# Patient Record
Sex: Female | Born: 1945
Health system: Southern US, Community
[De-identification: ages and names within clinical notes are randomized; demographics above are authoritative.]

## PROBLEM LIST (undated history)

## (undated) DIAGNOSIS — I639 Cerebral infarction, unspecified: Secondary | ICD-10-CM

## (undated) DIAGNOSIS — E119 Type 2 diabetes mellitus without complications: Secondary | ICD-10-CM

## (undated) DIAGNOSIS — R112 Nausea with vomiting, unspecified: Secondary | ICD-10-CM

## (undated) DIAGNOSIS — I10 Essential (primary) hypertension: Secondary | ICD-10-CM

## (undated) DIAGNOSIS — E669 Obesity, unspecified: Secondary | ICD-10-CM

## (undated) DIAGNOSIS — I69398 Other sequelae of cerebral infarction: Secondary | ICD-10-CM

## (undated) DIAGNOSIS — I480 Paroxysmal atrial fibrillation: Secondary | ICD-10-CM

## (undated) DIAGNOSIS — E785 Hyperlipidemia, unspecified: Secondary | ICD-10-CM

## (undated) DIAGNOSIS — I447 Left bundle-branch block, unspecified: Secondary | ICD-10-CM

## (undated) DIAGNOSIS — IMO0002 Reserved for concepts with insufficient information to code with codable children: Secondary | ICD-10-CM

## (undated) DIAGNOSIS — I440 Atrioventricular block, first degree: Secondary | ICD-10-CM

## (undated) DIAGNOSIS — Z9889 Other specified postprocedural states: Secondary | ICD-10-CM

## (undated) HISTORY — PX: KNEE ARTHROSCOPY: SUR90

## (undated) HISTORY — DX: Essential (primary) hypertension: I10

## (undated) HISTORY — DX: Obesity, unspecified: E66.9

## (undated) HISTORY — DX: Hyperlipidemia, unspecified: E78.5

## (undated) HISTORY — PX: CHOLECYSTECTOMY: SHX55

## (undated) HISTORY — PX: TOTAL ABDOMINAL HYSTERECTOMY: SHX209

## (undated) HISTORY — DX: Atrioventricular block, first degree: I44.0

## (undated) HISTORY — DX: Left bundle-branch block, unspecified: I44.7

## (undated) HISTORY — DX: Reserved for concepts with insufficient information to code with codable children: IMO0002

## (undated) HISTORY — DX: Paroxysmal atrial fibrillation: I48.0

---
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
## 2003-02-23 ENCOUNTER — Ambulatory Visit (HOSPITAL_COMMUNITY): Admission: RE | Admit: 2003-02-23 | Discharge: 2003-02-23 | Payer: Self-pay | Admitting: Orthopaedic Surgery

## 2006-06-17 ENCOUNTER — Encounter (INDEPENDENT_AMBULATORY_CARE_PROVIDER_SITE_OTHER): Payer: Self-pay | Admitting: Specialist

## 2006-06-18 ENCOUNTER — Ambulatory Visit (HOSPITAL_COMMUNITY): Admission: RE | Admit: 2006-06-18 | Discharge: 2006-06-18 | Payer: Self-pay | Admitting: Orthopaedic Surgery

## 2007-08-25 ENCOUNTER — Ambulatory Visit (HOSPITAL_COMMUNITY): Admission: RE | Admit: 2007-08-25 | Discharge: 2007-08-25 | Payer: Self-pay | Admitting: Family Medicine

## 2007-08-28 ENCOUNTER — Emergency Department (HOSPITAL_COMMUNITY): Admission: EM | Admit: 2007-08-28 | Discharge: 2007-08-28 | Payer: Self-pay | Admitting: Emergency Medicine

## 2007-09-10 ENCOUNTER — Ambulatory Visit: Payer: Self-pay | Admitting: Orthopedic Surgery

## 2007-09-10 ENCOUNTER — Telehealth: Payer: Self-pay | Admitting: Orthopedic Surgery

## 2009-06-24 HISTORY — PX: BACK SURGERY: SHX140

## 2010-01-23 ENCOUNTER — Ambulatory Visit: Payer: Self-pay

## 2010-02-14 ENCOUNTER — Inpatient Hospital Stay (HOSPITAL_COMMUNITY): Admission: EM | Admit: 2010-02-14 | Discharge: 2010-02-16 | Payer: Self-pay | Source: Home / Self Care

## 2010-02-15 ENCOUNTER — Encounter (INDEPENDENT_AMBULATORY_CARE_PROVIDER_SITE_OTHER): Payer: Self-pay | Admitting: Internal Medicine

## 2010-02-16 ENCOUNTER — Encounter (INDEPENDENT_AMBULATORY_CARE_PROVIDER_SITE_OTHER): Payer: Self-pay | Admitting: *Deleted

## 2010-02-16 LAB — CONVERTED CEMR LAB
ALT: 26 units/L
Albumin: 3.3 g/dL
Calcium: 8.8 mg/dL
Glomerular Filtration Rate, Af Am: >60 mL/min/{1.73_m2}
HDL: 28 mg/dL
LDL (calc): 178 mg/dL
MCHC: 34.4 g/dL
MCV: 90.6 fL
Platelets: 276 10*3/uL
Potassium: 3.9 meq/L
RBC: 4.27 M/uL
RDW: 12.9 %
Sodium: 138 meq/L
TSH: 0.96 microintl units/mL

## 2010-02-27 ENCOUNTER — Encounter (INDEPENDENT_AMBULATORY_CARE_PROVIDER_SITE_OTHER): Payer: Self-pay | Admitting: *Deleted

## 2010-02-28 ENCOUNTER — Encounter (INDEPENDENT_AMBULATORY_CARE_PROVIDER_SITE_OTHER): Payer: Self-pay | Admitting: *Deleted

## 2010-02-28 ENCOUNTER — Ambulatory Visit
Admission: RE | Admit: 2010-02-28 | Discharge: 2010-02-28 | Payer: Self-pay | Source: Home / Self Care | Attending: Cardiology | Admitting: Cardiology

## 2010-02-28 ENCOUNTER — Encounter
Admission: RE | Admit: 2010-02-28 | Discharge: 2010-02-28 | Payer: Self-pay | Source: Home / Self Care | Attending: Neurosurgery | Admitting: Neurosurgery

## 2010-02-28 DIAGNOSIS — I447 Left bundle-branch block, unspecified: Secondary | ICD-10-CM | POA: Insufficient documentation

## 2010-02-28 DIAGNOSIS — E785 Hyperlipidemia, unspecified: Secondary | ICD-10-CM | POA: Insufficient documentation

## 2010-02-28 DIAGNOSIS — E669 Obesity, unspecified: Secondary | ICD-10-CM | POA: Insufficient documentation

## 2010-02-28 DIAGNOSIS — M545 Low back pain: Secondary | ICD-10-CM | POA: Insufficient documentation

## 2010-02-28 DIAGNOSIS — I440 Atrioventricular block, first degree: Secondary | ICD-10-CM | POA: Insufficient documentation

## 2010-03-05 ENCOUNTER — Encounter: Payer: Self-pay | Admitting: Cardiology

## 2010-03-17 ENCOUNTER — Encounter: Payer: Self-pay | Admitting: Family Medicine

## 2010-03-20 ENCOUNTER — Encounter
Admission: RE | Admit: 2010-03-20 | Discharge: 2010-03-20 | Payer: Self-pay | Source: Home / Self Care | Attending: Neurosurgery | Admitting: Neurosurgery

## 2010-03-27 ENCOUNTER — Encounter: Payer: Self-pay | Admitting: Neurosurgery

## 2010-03-28 ENCOUNTER — Encounter: Payer: Self-pay | Admitting: Cardiology

## 2010-03-28 NOTE — Miscellaneous (Signed)
Summary: labs 02/16/2010  Clinical Lists Changes  Observations: Added new observation of CALCIUM: 8.5 mg/dL (16/02/930 35:57) Added new observation of GFR AA: >60 mL/min/1.69m2 (02/16/2010 15:33) Added new observation of GFR: >60 mL/min (02/16/2010 15:33) Added new observation of CREATININE: 0.60 mg/dL (32/20/2542 70:62) Added new observation of BUN: 15 mg/dL (37/62/8315 17:61) Added new observation of BG RANDOM: 123 mg/dL (60/73/7106 26:94) Added new observation of CO2 PLSM/SER: 23 meq/L (02/16/2010 15:33) Added new observation of CL SERUM: 108 meq/L (02/16/2010 15:33) Added new observation of K SERUM: 3.9 meq/L (02/16/2010 15:33) Added new observation of NA: 138 meq/L (02/16/2010 15:33) Added new observation of PLATELETK/UL: 276 K/uL (02/16/2010 15:33) Added new observation of RDW: 12.9 % (02/16/2010 15:33) Added new observation of MCHC RBC: 34.4 g/dL (85/46/2703 50:09) Added new observation of MCV: 90.6 fL (02/16/2010 15:33) Added new observation of HCT: 38.7 % (02/16/2010 15:33) Added new observation of HGB: 13.3 g/dL (38/18/2993 71:69) Added new observation of RBC M/UL: 4.27 M/uL (02/16/2010 15:33) Added new observation of WBC COUNT: 8.6 10*3/microliter (02/16/2010 15:33)

## 2010-03-28 NOTE — Letter (Signed)
Summary: [facility] Work [facility], agricultural at [employer]  [address] [place] [address]   Phone: [phone]  Fax: [phone]     February 28, 2010 MRN: [id number]   Sabrina Taylor [address] [place], [place]  [address]      YOUR LAB WORK IS DUE   April 01, 2010  Please go to [facility], located across the street from [facility] on the second floor.  Hours are Monday - Friday 7am until 7:30pm         Saturday 8am until 12noon    _X_  DO NOT EAT OR DRINK AFTER MIDNIGHT EVENING PRIOR TO LABWORK

## 2010-03-28 NOTE — Assessment & Plan Note (Signed)
Summary: post ED visit per pt phone call per Dr.Wall/tg   Visit Type:  Follow-up Primary Provider:  Dr. Karleen Hampshire   History of Present Illness: Ms. Sabrina Taylor returns to the office following a recent hospital admission for atrial fibrillation.  She presented with back pain and constipation and was noted incidentally to have atrial fibrillation with a somewhat rapid ventricular response.  She had no symptoms definitely attributable to her atrial arrhythmia and converted spontaneously to sinus rhythm after less than a day and a half.  Chest x-ray, echocardiogram and TSH were unremarkable.  She had no previous significant cardiac history and was not previously known to have arrhythmias.  EKG  Procedure date:  2010/03/24  Findings:      Normal sinus rhythm at a rate of 80 First degree AV block with a PR interval of 230 ms Borderline left atrial abnormality Left axis deviation Left bundle branch block Comparison with previous tracing of 02/14/10: Atrial flutter with 2-1 AV block was previously present; LBBB morphology is unchanged; axis has shifted leftward.  -  Date:  02/16/2010    SGOT (AST): 20    SGPT (ALT): 26    T. Bilirubin: 0.7    Alk Phos: 58    Total Protein: 6.8    Albumin: 3.3    Cholesterol: 227    LDL-calculated: 178    HDL: 28    Triglycerides: 045    TSH: 0.96   Current Medications (verified): 1)  Lipitor 20 Mg Tabs (Atorvastatin Calcium) .... Take 1 Tab Daily 2)  Toprol Xl 25 Mg Xr24h-Tab (Metoprolol Succinate) .... Take 1 Tab Daily 3)  Senokot 8.6 Mg Tabs (Sennosides) .... Take 2 Tabs Daily 4)  Cardizem Cd 300 Mg Xr24h-Cap (Diltiazem Hcl Coated Beads) .... Take 1 Tab Daily 5)  Aspir-Low 81 Mg Tbec (Aspirin) .... Take 1 Tab Daily 6)  Calcium Plus Vitamin D 500-50 Mg-Unit Caps (Calcium Carbonate-Vitamin D) .... Take 1 Tab Daily 7)  Fish Oil 1000 Mg Caps (Omega-3 Fatty Acids) .... Take 2 Caps Daily 8)  Ultram 50 Mg Tabs (Tramadol Hcl) .... As Needed 9)   Premarin 0.3 Mg Tabs (Estrogens Conjugated) .Marland Kitchen.. 1 Tab Every 3rd Day 10)  Vitamin E 600 Unit Caps (Vitamin E) .... Take 1 Tab Daily 11)  Zyrtec Allergy 10 Mg Caps (Cetirizine Hcl) .... Take 1 Tab Daily  Allergies (verified): 1)  ! Penicillin  Comments:  Nurse/Medical Assistant: patient brought meds she uses [facility] [place]  Past History:  Family History: Last updated: 2010/03/24 Father:deceased due to stroke; s/p CABG Mother:deceased  secondary to Alzheimer's; h/o hypertension Siblings: Brother with coronary artery disease; sister died in infancy  Social History: Last updated: 03/24/2010 Married and resides in [place] ; one adult child Employment-homemaker; previously worked for [employer] [place] Tobacco Use - No.  Alcohol Use - no Regular Exercise - no Drug Use - no  Past Medical History: Paroxysmal atrial fibrillation with rapid ventricular response Conduction system disease: LBBB; first degree AV block Hyperlipidemia Hypertension Obesity Degenerative disc disease-low back pain  Past Surgical History: Appendectomy-1960 Cholecystectomy Total abdominal hysterectomy Arthroscopic right knee surgery-2004 and 2008  Family History: Father:deceased due to stroke; s/p CABG Mother:deceased  secondary to Alzheimer's; h/o hypertension Siblings: Brother with coronary artery disease; sister died in infancy  Social History: Married and resides in [place] ; one adult child Employment-homemaker; previously worked for [employer] [place] Tobacco Use - No.  Alcohol Use - no Regular Exercise - no Drug Use -  no  Review of Systems       She requires corrective lenses.  She is troubled by chronic low back pain.  She intermittently notes mild pedal edema.  Vital Signs:  Patient profile:   65 year old female Height:      68 inches Weight:      220 pounds BMI:     33.57 O2 Sat:      97 % on Room air Pulse rate:   88 / minute BP sitting:   141 / 86  (right  arm)  Vitals Entered By: Dreama Saa, CNA (February 28, 2010 1:25 PM)  O2 Flow:  Room air  Physical Exam  General:  Obese; well developed; no acute distress:   Neck-No JVD; no carotid bruits: Lungs-No tachypnea, no rales; no rhonchi; no wheezes: Cardiovascular-normal PMI; normal S1; increased intensity of S2 with normal splitting Abdomen-BS normal; soft and non-tender without masses or organomegaly:  Musculoskeletal-No deformities, no cyanosis or clubbing: Neurologic-Normal cranial nerves; symmetric strength and tone:  Skin-Warm, no significant lesions: Extremities-Nl distal pulses; no edema:     Impression & Recommendations:  Problem # 1:  ATRIAL FIBRILLATION-PAROXYSMAL (ICD-427.31) Paroxysmal atrial fibrillation is quiescent for the time being.  Her episode leading to hospital admission may have been caused by physiologic stress.  She will call if she experiences palpitations or notes an increased heart rate.  Problem # 2:  HYPERLIPIDEMIA (ICD-272.4) Lipid profile was suboptimal; however, it is unclear whether she was treated pharmacologically at that time.  A repeat profile and chemistry profile will be obtained.  CHOL: 227 (02/16/2010)   LDL: 178 (02/16/2010)   HDL: 28 (02/16/2010)   TG: 107 (02/16/2010)  Problem # 3:  HYPERTENSION (ICD-401.1) Control of hypertension is adequate.  Maintaining excellent control will likely reduce the probability of recurrent AF. BP today: 141/86  Labs Reviewed: K+: 3.9 (02/16/2010) Creat: : 0.60 (02/16/2010)     Problem # 4:  AV BLOCK, 1ST DEGREE (ICD-426.11) Patient has conduction system disease with both an increased PR interval and LBBB.  This may limit pharmacologic therapy at some point in the future.  Other Orders: Future Orders: T-Comprehensive Metabolic Panel ([id number]) ... 04/01/2010 T-Lipid Profile [phone]) ... 04/01/2010  Patient Instructions: 1)  Your physician recommends that you schedule a follow-up  appointment in: 4 MONTHS 2)  Your physician recommends that you continue on your current medications as directed. Please refer to the Current Medication list given to you today. 3)  Your physician has requested that you regularly monitor and record your blood pressure readings at home.  Please use the same machine at the same time of day to check your readings and record them to bring to your follow-up visit. CALL FOR PALPITATIONS OR INCREASED HEART RATE AND USE SAME BLOOD PRESSURE CUFF TO CHECK BLOOD PRESSURE 4)  Your physician recommends that you return for lab work in: 1 MONTH

## 2010-04-03 NOTE — Letter (Signed)
Summary: BP LOG  BP LOG   Imported By: Faythe Ghee 03/28/2010 13:01:14  _____________________________________________________________________  External Attachment:    Type:   Image     Comment:   External Document

## 2010-04-04 ENCOUNTER — Encounter (INDEPENDENT_AMBULATORY_CARE_PROVIDER_SITE_OTHER): Payer: Self-pay | Admitting: *Deleted

## 2010-04-04 ENCOUNTER — Telehealth: Payer: Self-pay | Admitting: Cardiology

## 2010-04-04 ENCOUNTER — Other Ambulatory Visit: Payer: Self-pay | Admitting: Neurosurgery

## 2010-04-04 DIAGNOSIS — M549 Dorsalgia, unspecified: Secondary | ICD-10-CM

## 2010-04-04 DIAGNOSIS — M5104 Intervertebral disc disorders with myelopathy, thoracic region: Secondary | ICD-10-CM

## 2010-04-09 ENCOUNTER — Ambulatory Visit
Admission: RE | Admit: 2010-04-09 | Discharge: 2010-04-09 | Disposition: A | Discharge status: Home / Self Care | Payer: Managed Care, Other (non HMO) | Source: Ambulatory Visit | Attending: Neurosurgery | Admitting: Neurosurgery

## 2010-04-09 DIAGNOSIS — M5104 Intervertebral disc disorders with myelopathy, thoracic region: Secondary | ICD-10-CM

## 2010-04-09 DIAGNOSIS — M549 Dorsalgia, unspecified: Secondary | ICD-10-CM

## 2010-04-11 NOTE — Progress Notes (Addendum)
  Phone Note Call from Patient Call back at [facility] Phone [phone]   Caller: PT Reason for Call: Talk to Nurse Summary of Call: PT STATES THAT SHE CALLED LAST WEEK AND SPOKE TO A NURSE ABOUT HER BP BEING HIGH AND WAS TOLD THAT THEY WOULD TALK WITH DOCTOR AND CALL HER BACK TO SEE IF SHE NEEDED TO COME IN FOR APPT OR ADJUST MEDS. Initial call taken by: Faythe Ghee,  April 04, 2010 10:32 AM     Appended Document:  SIGNED OFF BY MISTAKE/TMJ  Appended Document:  Per KL per to increase diltiazem to 360mg  daily

## 2010-04-11 NOTE — Miscellaneous (Signed)
  Clinical Lists Changes  Medications: Changed medication from CARDIZEM CD 300 MG XR24H-CAP (DILTIAZEM HCL COATED BEADS) take 1 tab daily to DILTIAZEM HCL ER BEADS 360 MG XR24H-CAP (DILTIAZEM HCL ER BEADS) Take one capsule by mouth daily

## 2010-04-16 ENCOUNTER — Encounter: Payer: Self-pay | Admitting: Cardiology

## 2010-04-16 LAB — CONVERTED CEMR LAB
Albumin: 4.2 g/dL (ref 3.5–5.2)
BUN: 17 mg/dL (ref 6–23)
Cholesterol: 186 mg/dL (ref 0–200)
Glucose, Bld: 107 mg/dL — ABNORMAL HIGH (ref 70–99)
Potassium: 3.9 meq/L (ref 3.5–5.3)
Total CHOL/HDL Ratio: 3.2
VLDL: 24 mg/dL (ref 0–40)

## 2010-05-06 LAB — CARDIAC PANEL(CRET KIN+CKTOT+MB+TROPI)
CK, MB: 2.9 ng/mL (ref 0.3–4.0)
CK, MB: 3.4 ng/mL (ref 0.3–4.0)
Relative Index: INVALID (ref 0.0–2.5)
Relative Index: INVALID (ref 0.0–2.5)
Total CK: 50 U/L (ref 7–177)
Troponin I: 0.1 ng/mL — ABNORMAL HIGH (ref 0.00–0.06)
Troponin I: 0.13 ng/mL — ABNORMAL HIGH (ref 0.00–0.06)

## 2010-05-06 LAB — COMPREHENSIVE METABOLIC PANEL
ALT: 26 U/L (ref 0–35)
Calcium: 8.8 mg/dL (ref 8.4–10.5)
Glucose, Bld: 125 mg/dL — ABNORMAL HIGH (ref 70–99)
Sodium: 142 mEq/L (ref 135–145)
Total Bilirubin: 0.7 mg/dL (ref 0.3–1.2)

## 2010-05-06 LAB — BASIC METABOLIC PANEL
CO2: 23 mEq/L (ref 19–32)
CO2: 24 mEq/L (ref 19–32)
Calcium: 8.5 mg/dL (ref 8.4–10.5)
Chloride: 105 mEq/L (ref 96–112)
Creatinine, Ser: 0.6 mg/dL (ref 0.4–1.2)
GFR calc Af Amer: >60 mL/min (ref >60)
GFR calc Af Amer: >60 mL/min (ref >60)
Sodium: 138 mEq/L (ref 135–145)
Sodium: 140 mEq/L (ref 135–145)

## 2010-05-06 LAB — CBC
HCT: 39.9 % (ref 36.0–46.0)
HCT: 43.3 % (ref 36.0–46.0)
Hemoglobin: 13.3 g/dL (ref 12.0–15.0)
MCHC: 34.3 g/dL (ref 30.0–36.0)
MCHC: 34.4 g/dL (ref 30.0–36.0)
MCHC: 35.8 g/dL (ref 30.0–36.0)
MCV: 90 fL (ref 78.0–100.0)
MCV: 90.5 fL (ref 78.0–100.0)
Platelets: 276 10*3/uL (ref 150–400)
Platelets: 287 10*3/uL (ref 150–400)
Platelets: 305 10*3/uL (ref 150–400)
RBC: 4.27 MIL/uL (ref 3.87–5.11)
RDW: 12.7 % (ref 11.5–15.5)
RDW: 12.8 % (ref 11.5–15.5)

## 2010-05-06 LAB — LIPID PANEL: VLDL: 21 mg/dL (ref 0–40)

## 2010-05-06 LAB — MRSA PCR SCREENING: MRSA by PCR: NEGATIVE

## 2010-05-06 LAB — BRAIN NATRIURETIC PEPTIDE: Pro B Natriuretic peptide (BNP): 367 pg/mL — ABNORMAL HIGH (ref 0.0–100.0)

## 2010-05-06 LAB — DIFFERENTIAL
Eosinophils Absolute: 0.1 10*3/uL (ref 0.0–0.7)
Eosinophils Relative: 1 % (ref 0–5)
Lymphocytes Relative: 28 % (ref 12–46)
Lymphs Abs: 2.5 10*3/uL (ref 0.7–4.0)
Monocytes Absolute: 0.8 10*3/uL (ref 0.1–1.0)
Monocytes Absolute: 0.8 10*3/uL (ref 0.1–1.0)
Monocytes Relative: 9 % (ref 3–12)
Neutro Abs: 5.4 10*3/uL (ref 1.7–7.7)
Neutrophils Relative %: 62 % (ref 43–77)
Neutrophils Relative %: 73 % (ref 43–77)

## 2010-05-06 LAB — T4, FREE: Free T4: 1.24 ng/dL (ref 0.80–1.80)

## 2010-05-06 LAB — PROTIME-INR: INR: 1.1 (ref 0.00–1.49)

## 2010-05-06 LAB — POCT CARDIAC MARKERS
CKMB, poc: 2.5 ng/mL (ref 1.0–8.0)
Troponin i, poc: <0.05 ng/mL (ref 0.00–0.09)

## 2010-05-20 ENCOUNTER — Other Ambulatory Visit: Payer: Self-pay | Admitting: Cardiology

## 2010-05-20 DIAGNOSIS — I10 Essential (primary) hypertension: Secondary | ICD-10-CM

## 2010-05-20 DIAGNOSIS — E782 Mixed hyperlipidemia: Secondary | ICD-10-CM

## 2010-05-20 MED ORDER — METOPROLOL SUCCINATE ER 25 MG PO TB24: 25.0000 mg | ORAL_TABLET | Freq: Every day | ORAL | Status: DC

## 2010-05-20 MED ORDER — ATORVASTATIN CALCIUM 20 MG PO TABS: 20.0000 mg | ORAL_TABLET | Freq: Every day | ORAL | Status: DC

## 2010-05-20 NOTE — Telephone Encounter (Signed)
**Note De-Identified Mirna Sutcliffe Obfuscation** Pt. advised of lab results and refills on Metoprolol and Lipitor faxed to [facility] in [place].

## 2010-05-20 NOTE — Telephone Encounter (Signed)
PT NEVER HEARD BACK FROM TEST RESULTS. PLUS SHE NEEDS NEW RX CALLED IN TO [facility]. METOPROLOL AND LIPITOR WAS GIVEN BY ED [facility] AND SHE IS OUT OF REFILLS.

## 2010-07-12 NOTE — Op Note (Signed)
Sabrina Taylor, Sabrina Taylor                 ACCOUNT NO.:  [id number]   MEDICAL RECORD NO.:  [id number]          PATIENT TYPE:  AMB   LOCATION:  DAY                           FACILITY:  [facility]   PHYSICIAN:  J. Darreld Mclean, M.D. DATE OF BIRTH:  1945-04-11   DATE OF PROCEDURE:  06/18/2006  DATE OF DISCHARGE:                               OPERATIVE REPORT   PREOPERATIVE DIAGNOSIS:  Tear in medial meniscus of right knee.   POSTOPERATIVE DIAGNOSES:  1. Tear in medial meniscus of right knee.  2. Degenerative joint disease of the right knee.   PROCEDURE:  Operative arthroscopy of the right knee, partial medial  meniscectomy.   ANESTHESIA:  General.   TOURNIQUET TIME:  15 minutes.   No drains.   SURGEON:  Dr. Hilda Lias.   INDICATIONS:  The patient is a 65 year old female with pain and  tenderness in her right knee with some giving way. Her symptoms have  gotten worse. She previously had undergone arthroscopy several years ago  and had done well until recently. New MRI shows new tear in the  posterior horn of the medial meniscus. The patient has not improved with  conservative treatment, and surgery is recommended. Risks and  imponderables were discussed with the patient preoperatively. She  appeared to understand and agreed with the procedure as outlined.   DESCRIPTION OF PROCEDURE:  The patient was seen in the holding area.  The right knee was identified as the correct surgical site. She placed a  mark on the right knee, and I placed a mark on the right knee. She was  brought back to the operating room. She was given general anesthesia  while supine on the operating room table. Leg holder and deflated  tourniquet placed, right upper thigh. Was prepped and draped in the  usual manner. Had a time out, identifying [name] as the patient and  right knee as the correct surgical site. Leg was elevated and wrapped  circumferentially with an Esmarch bandage. Tourniquet inflated with 300  mmHg.  Esmarch bandage removed. Inflow cannula inserted medially.  Lactated ringers instilled into the knee by an infusion pump.  Arthroscope was inserted laterally. Permanent pictures were taken  throughout the procedure.   FINDINGS:  Suprapatellar pouch had mild synovitis, and there was some  mild degenerative changes, grade 2, around the patellofemoral joint.  Medially, there was some grade 2-3 changes in the femoral condyle and  tibial plateau, and there was a tear in the posterior horn of the medial  meniscus. Anterior cruciate was intact. Medially, there was grade 2  changes with a nice-appearing lateral meniscus with some slight fraying  around the edges. No loose bodies.   Attention was directed to the medial side, and using meniscal punch  shaver, good smooth contour was obtained. Permanent pictures were taken.  Knee was systematically reexamined. No new pathology found. Wounds were  reapproximated using 3-0 nylon interrupted vertical mattress manner.  Marcaine 0.25 instilled in each portal. Tourniquet deflated after 15  minutes. Sterile dressing applied. Bulky dressing applied. Knee  immobilizer applied. The patient tolerated the  procedure well and will  be seen in physical therapy at the beginning of next week. She is to  contact me through the office or hospital beeper system if any problems.  A prescription for Vicodin ES was given for pain.           ______________________________  Shela Commons. Darreld Mclean, M.D.     JWK/MEDQ  D:  06/18/2006  T:  06/18/2006  Job:  [id number]

## 2010-07-12 NOTE — Op Note (Signed)
NAME:  Sabrina Taylor, Sabrina Taylor                           ACCOUNT NO.:  [id number]   MEDICAL RECORD NO.:  [id number]                   PATIENT TYPE:  AMB   LOCATION:  DAY                                  FACILITY:  [facility]   PHYSICIAN:  J. Darreld Mclean, M.D.              DATE OF BIRTH:  February 03, 1946   DATE OF PROCEDURE:  DATE OF DISCHARGE:                                 OPERATIVE REPORT   PREOPERATIVE DIAGNOSIS:  Torn medial meniscus of the knee on the right.   POSTOPERATIVE DIAGNOSIS:  Torn medial meniscus of the knee on the right.   PROCEDURE:  Operative arthroscopy with partial medial meniscectomy.   TOURNIQUET TIME:  13 minutes.   SURGEON:  J. Darreld Mclean, M.D.   ASSISTANT:  Candace Cruise, P. A.   ANESTHESIA:  General.   INDICATIONS FOR PROCEDURE:  The patient is a 65 year old female whose had  pain and tenderness in her knee for several months.  MRI showed a tear of  the medial meniscus and the posterior horn with a flap type tear.  This was  done in July. The patient delayed surgery for personal reasons. The knee  pain has gotten worse and she would like to go ahead and have the procedure  done at this time.  The risks and imponderables were discussed with the  patient preoperatively.   FINDINGS:  She has got mild synovitis of the patella pouch area.  Medially  there was a flap tear in the posterior horn.  The articular surfaces looked  good with grade 2 changes medially. The anterior cruciate was intact  laterally, meniscus was normal with some slight degenerative fraying of the  edges. The tibial plateau, however, had changes of degenerative joint  disease grade 3 changes and the femoral condyle had grade 2 changes.  No  loose fragments were present in the knee. No loose bodies were present.   DESCRIPTION OF PROCEDURE:  The patient was placed supine on the operating  room table and after general anesthesia was obtained, tourniquet placed,  deflated right upper thigh. The patient  was prepped and draped in the usual  manner.  I reascertained we were doing Sabrina Taylor's right knee.  The inflow  cannula was inserted medially after the tourniquet had been inflated and  after an Esmarch bandage had been used, had been applied and removed. The  inflow cannula had the lactated Ringer's instilled into the knee by an  infusion pump.  Arthroscope inserted laterally, please see findings above.  Permanent pictures were taken throughout the procedure. Attention directed  to the posterior horn medial flap tear and using the meniscal knife,  meniscal shaver and meniscal punch, we were able to get a good smooth  contour. The knee was very tight and I had to use a smaller meniscal shaver  3.5 and were able to get a good smooth contour.  Permanent pictures were  taken at the end. We went back and looked over at the lateral side once  again and she does have significant changes occurring at the tibial plateau  and this will be a problem over time. The knee was irrigated with remaining  lactated Ringer's.  The wound was reapproximated using 3-0 nylon in  interrupted vertical mattress manner.  Marcaine 0.25% instilled in each  portal.  The tourniquet deflated after 13 minutes, sterile dressing applied,  bulky dressing applied, knee immobilizer applied. A prescription given for  Vicodin ES for pain. I will see her in the office in approximately 10 days  to 2 weeks, physical therapy has been arranged.      ___________________________________________                                            Teola Bradley, M.D.   JWK/MEDQ  D:  02/23/2003  T:  02/23/2003  Job:  [id number]

## 2010-07-12 NOTE — H&P (Signed)
NAMEMIYUKI, RZASA                 ACCOUNT NO.:  [id number]   MEDICAL RECORD NO.:  [id number]          PATIENT TYPE:  AMB   LOCATION:  DAY                           FACILITY:  [facility]   PHYSICIAN:  J. Darreld Mclean, M.D. DATE OF BIRTH:  Mar 20, 1945   DATE OF ADMISSION:  DATE OF DISCHARGE:  LH                              HISTORY & PHYSICAL   CHIEF COMPLAINT:  Right knee pain.   HISTORY OF PRESENT ILLNESS:  The patient is a 65 year old female with  pain and tenderness in the right knee.  I did an arthroscopy of her  right knee in December of 2004 and a partial medial meniscectomy; she  did well.  She had recurring knee pain on the right, more on the medial  side of the knee which was from giving way.  There has been no locking.  I saw her in my office on May 08, 2006 with increased pain and  tenderness.  I injected her knee and scheduled a MRI of her knee.  The  MRI was done on June 03, 2006 at [facility].  It showed changes in  the right knee medial meniscus, suggesting a re-tear of the remaining  portion of the meniscus.  There was no displaced fragment.  She had  progressive chondromalacia of the medial compartment and DJD changes  diffusely.  The patient was informed of the findings.  She elected to go  ahead and have surgery of her knee.  She has already had arthroscopy of  her knee and understands the risks and imponderables.   PAST MEDICAL HISTORY:  Positive for hypertension.  Denies heart disease,  lung disease, kidney disease, __________, weakness, diabetes, TB,  rheumatic fever, cancer, ulcer disease or circulatory problems.   ALLERGIES:  THE PATIENT IS ALLERGIC TO PENICILLIN.   MEDICATIONS:  The patient is currently taking Norvasc 10 mg daily,  Zyrtec daily, aspirin 81 mg daily, multivitamin, ibuprofen and Premarin.  She also takes Vicodin 5/500 every 4 hours p.r.n. pain.   SOCIAL HISTORY:  She does not smoke.  She denies alcoholic beverages.  Dr. Regino Schultze is her  family doctor.  The patient is retired and lives in  [place].   PAST SURGICAL HISTORY:  She is status post right knee surgery, as  stated, in December of 2004, cholecystectomy and hysterectomy in 2001,  and an appendectomy in 1959 or 1960.   FAMILY HISTORY:  Heart disease and diabetes run in the family.   PHYSICAL EXAMINATION:  VITAL SIGNS:  BP is 130/78, pulse is 60,  respirations are 16 and she is afebrile.  Height is 5 feet 8-1/2 inches  and weight is 225 pounds.  GENERAL:  She is alert, cooperative and oriented.  HEENT:  Negative.  NECK:  Supple.  LUNGS:  Clear to P and A.  HEART:  Regular without murmur.  ABDOMEN:  Soft and nontender without mass.  EXTREMITIES:  Right knee with swelling and tenderness, and pain to  medial joint line.  Has weakly positive McMurray's.  Other extremities  within normal limits.  CNS:  Intact.  SKIN:  Intact.   IMPRESSION:  1. Medial meniscus re-tear of posterior horn.  2. Degenerative joint disease of right knee.  3. History of hypertension.   PLAN:  Operative arthroscopy of the right knee.  Discussed with the  patient the planned procedure, risks and imponderables.  She is familiar  with the procedure and has undergone this before.  Labs are pending.                                            ______________________________  J. Darreld Mclean, M.D.     JWK/MEDQ  D:  06/10/2006  T:  06/10/2006  Job:  [id number]

## 2010-07-12 NOTE — H&P (Signed)
NAME:  Sabrina Taylor, MCREYNOLDS                           ACCOUNT NO.:  [id number]   MEDICAL RECORD NO.:  [id number]                  PATIENT TYPE:   LOCATION:                                       FACILITY:  [facility]   PHYSICIAN:  J. Darreld Mclean, M.D.              DATE OF BIRTH:  [birth year]   DATE OF ADMISSION:  02/23/2003  DATE OF DISCHARGE:                                HISTORY & PHYSICAL   Patient scheduled for surgery on December 30th.   CHIEF COMPLAINT:  Right knee pain.   HISTORY:  The patient is a 65 year old female with pain and tenderness of  her right knee for several months.  I first saw her the end of June 2004  with complaints of knee pain. I was concerned about a medial meniscal injury  because she had pain, giving way, some slight instability, and sometimes  pain at night. We arranged for an MRI scan which was done at [facility]  which showed a tear of the inferior surface of the posterior meniscus with a  small meniscal fragment flip.  There was a strain to the [facility], degenerative  changes, and some mild effusion.  The MRI was done on July the 5th.  I saw  the patient in the office the following day on July the 6th; and explained  the findings to her.  She was planning to go on vacation the following week  and did not want to interrupt her vacation and wanted to delay the surgery.  I saw her back on July 21st.  She said that her knee had significantly  improved taking and anti-inflammatory, Celebrex, and she wanted to delay  surgery and see how she did.  She has done fairly well, but just recently  the knee has gotten progressively worse, during the month of December.  It  is bothering her more.  It has been very tender and she has periods of  instability.  She desires to have surgery at this time.   PAST HISTORY:  The patient has a history of hypertension since 2001.  She  denies heart disease, lung disease, kidney disease, __________ or weakness,  diabetes, TB, rheumatic  fever, cancer, polio, ulcer disease, or circulatory  problems.   ALLERGIES:  She is allergic to PENICILLIN.   CURRENT MEDICATIONS:  1. Horvas 10 mg once a day.  2. Zyrtec once a day.  3. Premarin once a day.  4. Celebrex.   SOCIAL HISTORY:  She does not smoke.  She does not drink.  She is a Statistician.  Dr. Regino Schultze is his family doctor. 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  Married, lives her in [place].   SURGICAL HISTORY:  She has had cholecystectomy in 2001 and appendectomy in  the 1960s.   FAMILY HISTORY:  Father had heart trouble and mother had hypertension and  has Alzheimer's and  one of her grandparents had a stroke.   PHYSICAL EXAMINATION:  VITAL SIGNS:  Blood pressure is 132/78, pulse 80,  respiratory rate 16, afebrile, height 5 feet 8 inches, weight 217.  GENERAL:  She is alert, cooperative and oriented.  HEENT:  Negative.  NECK:  Supple.  LUNGS:  Clear to P&A.  HEART:  Regular rate and rhythm without murmur.  ABDOMEN:  Soft and tender without masses.  EXTREMITIES:  Pain and tenderness in the knee on the right. More pain  medially with a slight effusion.  Other extremities are within normal  limits.  CENTRAL NERVOUS SYSTEM:  Intact.  SKIN:  Intact.   IMPRESSION:  Tear of the medial meniscus right knee.   PLAN:  1. Operative arthroscopy of the right knee.  2. Meniscectomy.  3. I discussed with the patient the planned procedure risks and     imponderable.  She appears to understand and agrees to the procedure as     outlined.   LABS:  Pending.     ___________________________________________                                         Teola Bradley, M.D.   JWK/MEDQ  D:  02/22/2003  T:  02/22/2003  Job:  [id number]

## 2010-09-14 ENCOUNTER — Other Ambulatory Visit: Payer: Self-pay | Admitting: Cardiology

## 2011-01-17 ENCOUNTER — Other Ambulatory Visit: Payer: Self-pay | Admitting: Cardiology

## 2011-02-20 ENCOUNTER — Encounter: Payer: Self-pay | Admitting: Cardiology

## 2011-03-03 ENCOUNTER — Other Ambulatory Visit (HOSPITAL_COMMUNITY): Payer: Self-pay | Admitting: Family Medicine

## 2011-03-03 DIAGNOSIS — Z139 Encounter for screening, unspecified: Secondary | ICD-10-CM

## 2011-03-06 NOTE — H&P (Signed)
NTS SOAP Note  Vital Signs:  Vitals as of: 03/06/2011: Systolic 166: Diastolic 77: Heart Rate 61: Temp 96.82F: Height 59ft 8in: Weight 244Lbs 0 Ounces: Pain Level 0: BMI 37  BMI : 37.1 kg/m2  Subjective: This 83 Years 34 Months old Female presents forscrrening colonoscopy.  Denies any GI complaints.  No family h/o colon carcinoma.  Review of Symptoms:  Constitutional:unremarkable Head:unremarkable Eyes:unremarkable Nose/Mouth/Throat:unremarkable Cardiovascular:unremarkable Respiratory:unremarkable Gastrointestinal:unremarkable Genitourinary:unremarkable Musculoskeletal:unremarkable Skin:unremarkable Hematolgic/Lymphatic:unremarkable Allergic/Immunologic:unremarkable   Past Medical History:Reviewed   Past Medical History  Surgical History: cholecystectomy, knee, TAH, back, appendectomy Medical Problems:  High Blood pressure, High cholesterol Allergies: nkda Medications: vit. e, fish oil, metoprolol, asa, premarin, atorvastatin, calcium, diltiazem, ibuprofen   Social History:Reviewed   Social History  Preferred Language: English ([place]) Race:  White Ethnicity: Not Hispanic / Latino Age: 21 Years 8 Months Marital Status:  M Alcohol:  No Recreational drug(s):  No   Smoking Status: Never smoker reviewed on 03/06/2011  Family History:Reviewed   Family History  Is there a family history of:No family h/o colon cancer             Father:  Coronary Artery Disease, Stroke             Mother:  Hypertension, Diabetes Type II             Sibling:  Hypertension    Objective Information: General:Well appearing, well nourished in no distress. Head:Atraumatic; no masses; no abnormalities Neck:Supple without lymphadenopathy.  Heart:RRR, no murmur or gallop.  Normal S1, S2.  No S3, S4.  Lungs:CTA bilaterally, no wheezes, rhonchi, rales.  Breathing unlabored. Abdomen:Soft, NT/ND, no HSM, no  masses. deferred to procedure  Assessment:Screening TCS  Diagnosis &amp; Procedure: DiagnosisCode: V76.51, ProcedureCode: [address],    PlanScheduled for TCS on 03/25/11.   Patient Education:Alternative treatments to surgery were discussed with patient (and family).Risks and benefits  of procedure were fully explained to the patient (and family) who gave informed consent. Patient/family questions were addressed.  Follow-up:Pending Surgery

## 2011-03-10 ENCOUNTER — Ambulatory Visit (HOSPITAL_COMMUNITY)
Admission: RE | Admit: 2011-03-10 | Discharge: 2011-03-10 | Disposition: A | Discharge status: Home / Self Care | Payer: Medicare Other | Source: Ambulatory Visit | Attending: Family Medicine | Admitting: Family Medicine

## 2011-03-10 DIAGNOSIS — Z1231 Encounter for screening mammogram for malignant neoplasm of breast: Secondary | ICD-10-CM | POA: Insufficient documentation

## 2011-03-10 DIAGNOSIS — Z139 Encounter for screening, unspecified: Secondary | ICD-10-CM

## 2011-03-11 ENCOUNTER — Encounter (HOSPITAL_COMMUNITY): Payer: Self-pay | Admitting: Dietician

## 2011-03-11 NOTE — Progress Notes (Signed)
[facility] Diabetes Class Completion  Date:March 11, 2011  Time: 10:00 AM  Pt attended [facility]'s Diabetes Class on March 11, 2011.   Patient was educated on the following topics: carbohydrate metabolism in relation to diabetes, sources of carbohydrate, carbohydrate counting, meal planning strategies, food label reading, and portion control.   Melody Haver, RD, LDN Date:March 11, 2011 Time: 10:00 AM

## 2011-03-14 ENCOUNTER — Other Ambulatory Visit: Payer: Self-pay | Admitting: Family Medicine

## 2011-03-14 DIAGNOSIS — R928 Other abnormal and inconclusive findings on diagnostic imaging of breast: Secondary | ICD-10-CM

## 2011-03-19 ENCOUNTER — Encounter (HOSPITAL_COMMUNITY): Payer: Self-pay | Admitting: Pharmacy Technician

## 2011-03-24 MED ORDER — SODIUM CHLORIDE 0.45 % IV SOLN
Freq: Once | INTRAVENOUS | Status: AC
  Administered 2011-03-25: 08:00:00 via INTRAVENOUS

## 2011-03-25 ENCOUNTER — Encounter (HOSPITAL_COMMUNITY)
Admission: RE | Disposition: A | Discharge status: Home / Self Care | Payer: Self-pay | Source: Ambulatory Visit | Attending: General Surgery

## 2011-03-25 ENCOUNTER — Other Ambulatory Visit: Payer: Self-pay | Admitting: General Surgery

## 2011-03-25 ENCOUNTER — Encounter (HOSPITAL_COMMUNITY): Payer: Self-pay

## 2011-03-25 ENCOUNTER — Ambulatory Visit (HOSPITAL_COMMUNITY)
Admission: RE | Admit: 2011-03-25 | Discharge: 2011-03-25 | Disposition: A | Discharge status: Home / Self Care | Payer: Medicare Other | Source: Ambulatory Visit | Attending: General Surgery | Admitting: General Surgery

## 2011-03-25 DIAGNOSIS — Z1211 Encounter for screening for malignant neoplasm of colon: Secondary | ICD-10-CM | POA: Insufficient documentation

## 2011-03-25 DIAGNOSIS — Z01812 Encounter for preprocedural laboratory examination: Secondary | ICD-10-CM | POA: Insufficient documentation

## 2011-03-25 DIAGNOSIS — I10 Essential (primary) hypertension: Secondary | ICD-10-CM | POA: Insufficient documentation

## 2011-03-25 DIAGNOSIS — E7800 Pure hypercholesterolemia, unspecified: Secondary | ICD-10-CM | POA: Insufficient documentation

## 2011-03-25 DIAGNOSIS — Z79899 Other long term (current) drug therapy: Secondary | ICD-10-CM | POA: Insufficient documentation

## 2011-03-25 DIAGNOSIS — D126 Benign neoplasm of colon, unspecified: Secondary | ICD-10-CM | POA: Insufficient documentation

## 2011-03-25 HISTORY — PX: COLONOSCOPY: SHX5424

## 2011-03-25 HISTORY — DX: Other specified postprocedural states: Z98.890

## 2011-03-25 HISTORY — DX: Nausea with vomiting, unspecified: R11.2

## 2011-03-25 SURGERY — COLONOSCOPY
Anesthesia: Moderate Sedation

## 2011-03-25 MED ORDER — STERILE WATER FOR IRRIGATION IR SOLN
Status: DC | PRN
  Administered 2011-03-25: 09:00:00

## 2011-03-25 MED ORDER — MIDAZOLAM HCL 5 MG/5ML IJ SOLN
INTRAMUSCULAR | Status: DC | PRN
  Administered 2011-03-25: 3 mg via INTRAVENOUS

## 2011-03-25 MED ORDER — MIDAZOLAM HCL 5 MG/5ML IJ SOLN
INTRAMUSCULAR | Status: AC
  Filled 2011-03-25: qty 5

## 2011-03-25 MED ORDER — MEPERIDINE HCL 25 MG/ML IJ SOLN
INTRAMUSCULAR | Status: DC | PRN
  Administered 2011-03-25: 50 mg via INTRAVENOUS

## 2011-03-25 MED ORDER — MEPERIDINE HCL 100 MG/ML IJ SOLN
INTRAMUSCULAR | Status: AC
  Filled 2011-03-25: qty 1

## 2011-03-25 NOTE — Op Note (Signed)
[facility] [address] [place], [place]  [address]  COLONOSCOPY PROCEDURE REPORT  PATIENT:  Sabrina, Taylor  MR#:  [id number] BIRTHDATE:  10/23/1945, 65 yrs. old  GENDER:  female ENDOSCOPIST:  Franky Macho, MD REF. BY:  Karleen Hampshire, M.D. PROCEDURE DATE:  03/25/2011 PROCEDURE:  Colonoscopy with snare polypectomy ASA CLASS:  Class II INDICATIONS:  Screening MEDICATIONS:   Versed 3 mg IV, demerol 50 mg IV  DESCRIPTION OF PROCEDURE:   After the risks benefits and alternatives of the procedure were thoroughly explained, informed consent was obtained.  Digital rectal exam was performed and revealed no abnormalities.   The EC-[id number] ([id number]) endoscope was introduced through the anus and advanced to the cecum, which was identified by both the appendix and ileocecal valve, without limitations.  The quality of the prep was adequate..  The instrument was then slowly withdrawn as the colon was fully examined. <<PROCEDUREIMAGES>>  FINDINGS:  A sessile polyp was found in the sigmoid colon, 2ocm from the anus (see image001 and image002).   Removed using snare cautry.  Sent to pathology.  Retroflexed views in the rectum revealed no abnormalities.  The scope was then withdrawn in  9 minutes from the cecum and the procedure completed. COMPLICATIONS:  None ENDOSCOPIC IMPRESSION: 1) Sessile polyp in the sigmoid colon 2) Normal colonoscopy otherwise RECOMMENDATIONS:  REPEAT EXAM:  In 3 year(s) for Colonoscopy.  ______________________________ Franky Macho, MD  CC:  Karleen Hampshire, MD  n. Rosalie DoctorFranky Macho at 03/25/2011 08:57 AM  Roderic Palau, [phone]

## 2011-03-26 ENCOUNTER — Ambulatory Visit (HOSPITAL_COMMUNITY)
Admission: RE | Admit: 2011-03-26 | Discharge: 2011-03-26 | Disposition: A | Discharge status: Home / Self Care | Payer: Medicare Other | Source: Ambulatory Visit | Attending: Family Medicine | Admitting: Family Medicine

## 2011-03-26 DIAGNOSIS — R928 Other abnormal and inconclusive findings on diagnostic imaging of breast: Secondary | ICD-10-CM

## 2011-04-01 ENCOUNTER — Encounter (HOSPITAL_COMMUNITY): Payer: Self-pay | Admitting: General Surgery

## 2011-09-04 ENCOUNTER — Other Ambulatory Visit (HOSPITAL_COMMUNITY): Payer: Self-pay | Admitting: Internal Medicine

## 2011-09-04 DIAGNOSIS — Z139 Encounter for screening, unspecified: Secondary | ICD-10-CM

## 2011-09-10 ENCOUNTER — Other Ambulatory Visit (HOSPITAL_COMMUNITY): Payer: Medicare Other

## 2011-09-17 ENCOUNTER — Ambulatory Visit (HOSPITAL_COMMUNITY)
Admission: RE | Admit: 2011-09-17 | Discharge: 2011-09-17 | Disposition: A | Discharge status: Home / Self Care | Payer: Medicare Other | Source: Ambulatory Visit | Attending: Internal Medicine | Admitting: Internal Medicine

## 2011-09-17 DIAGNOSIS — Z13820 Encounter for screening for osteoporosis: Secondary | ICD-10-CM | POA: Insufficient documentation

## 2011-09-17 DIAGNOSIS — Z780 Asymptomatic menopausal state: Secondary | ICD-10-CM | POA: Insufficient documentation

## 2011-09-17 DIAGNOSIS — Z139 Encounter for screening, unspecified: Secondary | ICD-10-CM

## 2012-09-14 ENCOUNTER — Other Ambulatory Visit: Payer: Self-pay | Admitting: Neurosurgery

## 2012-09-14 DIAGNOSIS — M5126 Other intervertebral disc displacement, lumbar region: Secondary | ICD-10-CM

## 2012-09-30 ENCOUNTER — Ambulatory Visit
Admission: RE | Admit: 2012-09-30 | Discharge: 2012-09-30 | Disposition: A | Discharge status: Home / Self Care | Payer: Medicare Other | Source: Ambulatory Visit | Attending: Neurosurgery | Admitting: Neurosurgery

## 2012-09-30 DIAGNOSIS — M5126 Other intervertebral disc displacement, lumbar region: Secondary | ICD-10-CM

## 2013-01-27 ENCOUNTER — Other Ambulatory Visit (HOSPITAL_COMMUNITY): Payer: Self-pay | Admitting: Family Medicine

## 2013-01-27 DIAGNOSIS — R748 Abnormal levels of other serum enzymes: Secondary | ICD-10-CM

## 2013-02-02 ENCOUNTER — Ambulatory Visit (HOSPITAL_COMMUNITY)
Admission: RE | Admit: 2013-02-02 | Discharge: 2013-02-02 | Disposition: A | Discharge status: Home / Self Care | Payer: Medicare Other | Source: Ambulatory Visit | Attending: Family Medicine | Admitting: Family Medicine

## 2013-02-02 DIAGNOSIS — R748 Abnormal levels of other serum enzymes: Secondary | ICD-10-CM

## 2013-02-02 DIAGNOSIS — R030 Elevated blood-pressure reading, without diagnosis of hypertension: Secondary | ICD-10-CM | POA: Insufficient documentation

## 2013-02-02 DIAGNOSIS — K7689 Other specified diseases of liver: Secondary | ICD-10-CM | POA: Insufficient documentation

## 2013-02-02 DIAGNOSIS — R7989 Other specified abnormal findings of blood chemistry: Secondary | ICD-10-CM | POA: Insufficient documentation

## 2013-02-02 DIAGNOSIS — E785 Hyperlipidemia, unspecified: Secondary | ICD-10-CM | POA: Insufficient documentation

## 2013-02-02 DIAGNOSIS — Z9089 Acquired absence of other organs: Secondary | ICD-10-CM | POA: Insufficient documentation

## 2013-06-21 ENCOUNTER — Encounter: Payer: Self-pay | Admitting: Family Medicine

## 2013-06-21 ENCOUNTER — Ambulatory Visit (INDEPENDENT_AMBULATORY_CARE_PROVIDER_SITE_OTHER): Payer: Medicare Other | Admitting: Family Medicine

## 2013-06-21 VITALS — BP 153/90 | HR 69 | Wt 226.0 lb

## 2013-06-21 DIAGNOSIS — M545 Low back pain, unspecified: Secondary | ICD-10-CM

## 2013-06-21 DIAGNOSIS — G5701 Lesion of sciatic nerve, right lower limb: Secondary | ICD-10-CM

## 2013-06-21 DIAGNOSIS — G5700 Lesion of sciatic nerve, unspecified lower limb: Secondary | ICD-10-CM

## 2013-06-21 MED ORDER — NAPROXEN 500 MG PO TABS: 500.0000 mg | ORAL_TABLET | Freq: Two times a day (BID) | ORAL | Status: DC

## 2013-06-21 NOTE — Patient Instructions (Signed)
Very nice to meet you Try exercises 3-4 times a week.  Sacroiliac Joint Mobilization and Rehab 1. Work on pretzel stretching, shoulder back and leg draped in front. 3-5 sets, 30 sec.. 2. hip abductor rotations. standing, hip flexion and rotation outward then inward. 3 sets, 15 reps. when can do comfortably, add ankle weights starting at 2 pounds.  3. cross over stretching - shoulder back to ground, same side leg crossover. 3-5 sets for 30 min..  4. rolling up and back knees to chest and rocking. 5. sacral tilt - 5 sets, hold for 5-10 seconds Tennis ball in back right pocket Ice 20 minutes 2 times a day can help Naproxen twice daily for 10 days.  Take tylenol 650 mg three times a day is the best evidence based medicine we have for arthritis.  Glucosamine sulfate 750mg  twice a day is a supplement that has been shown to help moderate to severe arthritis. Vitamin D 2000 IU daily Fish oil 2 grams daily.  Tumeric 500mg  twice daily.  Capsaicin topically up to four times a day may also help with pain. It's important that you continue to stay active. Controlling your weight is important. . Shoe inserts with good arch support may be helpful.  Spenco orthotics at [employer] could help.  Water aerobics and cycling with low resistance are the best two types of exercise for arthritis. Come back and see me in 3 weeks.

## 2013-06-21 NOTE — Assessment & Plan Note (Signed)
This is having some piriformis syndrome. Patient given home exercise program, manual massage, icing protocol. Patient will try the conservative approach and then come back again in 3 weeks for further evaluation. Medications per orders to

## 2013-06-21 NOTE — Progress Notes (Signed)
  [facility] [place], [place] [address] Phone: [phone] Subjective:    I'm seeing this patient by the request  of:  Leonides Grills, MD   CC: Back pain  Sabrina Taylor is a 68 y.o. female coming in with complaint of back pain. Patient has had a long history of back pain even having a microdiscectomy back in 2012. Patient had a repeat MRI that was reviewed by me that was stated in 2014. MRI showed postoperative changes at the L4-L5, microdiscectomy with mild right-sided lateral recess stenosis. Patient has seen a pain doctor and has had two epidural injections. Patient states that the first one did help but the cycling did not make any significant changes. Patient then had what appeared to be a radiofrequency ablation the patient states makes the pain worse. Patient states the pain seems to be on the right side is a constant dull throbbing aching pain that does radiate down the posterior lateral aspect of her leg. Patient states he can't go past her knee. Patient states that sitting is worse with standing and extension is worse than flexion. Patient denies any weakness denies any numbness of the lower extremity. Patient's pain though has stopped her from doing certain activities such as going up or down stairs secondary to the pain. Patient is a severity of 8/10. Patient has tried ibuprofen and Tylenol with mild improvement. Patient also extremity all that is prescribed by another provider.     Past medical history, social, surgical and family history all reviewed in electronic medical record.   Review of Systems: No headache, visual changes, nausea, vomiting, diarrhea, constipation, dizziness, abdominal pain, skin rash, fevers, chills, night sweats, weight loss, swollen lymph nodes, body aches, joint swelling, muscle aches, chest pain, shortness of breath, mood changes.   Objective Blood pressure 153/90, pulse 69, weight 226 lb  (102.513 kg), SpO2 97.00%.  General: No apparent distress alert and oriented x3 mood and affect normal, dressed appropriately.  HEENT: Pupils equal, extraocular movements intact  Respiratory: Patient's speak in full sentences and does not appear short of breath  Cardiovascular: No lower extremity edema, non tender, no erythema  Skin: Warm dry intact with no signs of infection or rash on extremities or on axial skeleton.  Abdomen: Soft nontender  Neuro: Cranial nerves II through XII are intact, neurovascularly intact in all extremities with 2+ DTRs and 2+ pulses.  Lymph: No lymphadenopathy of posterior or anterior cervical chain or axillae bilaterally.  Gait antalgic gait MSK:  Non tender with full range of motion and good stability and symmetric strength and tone of shoulders, elbows, wrist, hip, knee and ankles bilaterally.  Back Exam:  Inspection: obese. Poor core strength Motion: Flexion 45 deg, Extension 45 deg, Side Bending to 45 deg bilaterally,  Rotation to 45 deg bilaterally  SLR laying: Negative  XSLR laying: Negative  Palpable tenderness: Positive tenderness over the right SI joint as well as the piriformis muscle.Marland Kitchen FABER: Positive for right Sensory change: Gross sensation intact to all lumbar and sacral dermatomes.  Reflexes: 2+ at both patellar tendons, 2+ at achilles tendons, Babinski's downgoing.  Strength at foot  Plantar-flexion: 5/5 Dorsi-flexion: 5/5 Eversion: 5/5 Inversion: 5/5  Leg strength  Quad: 5/5 Hamstring: 5/5 Hip flexor: 5/5 Hip abductors: 5/5       Impression and Recommendations:     This case required medical decision making of moderate complexity.

## 2013-06-21 NOTE — Assessment & Plan Note (Signed)
Patient does have lumbar back pain that is likely having some osteoarthritic changes. Patient has had surgery before and does have some mild facet arthropathy at L4-L5 nerve root that could be corresponding to her pain. I believe the patient is having more of a piriformis syndrome and significant weakness of the hip abductor's. Ni[place] try conservative approach with home exercises, icing, manual massage, as well as a ten-day burst of anti-inflammatories. Patient will try these interventions and come back again in 3 weeks for further evaluation. Patient continues to have pain we will consider ultrasound and possible injection.

## 2013-07-07 ENCOUNTER — Encounter: Payer: Self-pay | Admitting: Family Medicine

## 2013-07-07 ENCOUNTER — Ambulatory Visit (INDEPENDENT_AMBULATORY_CARE_PROVIDER_SITE_OTHER): Payer: Medicare Other | Admitting: Family Medicine

## 2013-07-07 VITALS — BP 132/84 | HR 78

## 2013-07-07 DIAGNOSIS — G5700 Lesion of sciatic nerve, unspecified lower limb: Secondary | ICD-10-CM

## 2013-07-07 DIAGNOSIS — G5701 Lesion of sciatic nerve, right lower limb: Secondary | ICD-10-CM

## 2013-07-07 MED ORDER — DICLOFENAC SODIUM 2 % TD SOLN: 2.0000 "application " | Freq: Two times a day (BID) | TRANSDERMAL | Status: DC

## 2013-07-07 NOTE — Progress Notes (Signed)
  [name] [place], [place] [address] Phone: [phone] Subjective:     CC: Back pain followup  [id number] Sabrina Taylor is a 68 y.o. female coming in with complaint of back pain. Patient has had a long history of back pain even having a microdiscectomy back in 2012. Patient had a repeat MRI that was reviewed by me that was stated in 2014. MRI showed postoperative changes at the L4-L5, microdiscectomy with mild right-sided lateral recess stenosis. Patient has seen a pain doctor and has had two epidural injections. Patient was seen by me previously 2 weeks ago and was diagnosed with piriformis syndrome. Patient has been doing home exercise program but has not been doing the manual massage or any over-the-counter medications and was suggested. Patient states that she is only approximately 10% better. Patient denies  any new symptoms.  Past medical history, social, surgical and family history all reviewed in electronic medical record.   Review of Systems: No headache, visual changes, nausea, vomiting, diarrhea, constipation, dizziness, abdominal pain, skin rash, fevers, chills, night sweats, weight loss, swollen lymph nodes, body aches, joint swelling, muscle aches, chest pain, shortness of breath, mood changes.   Objective Blood pressure 132/84, pulse 78, SpO2 96.00%.  General: No apparent distress alert and oriented x3 mood and affect normal, dressed appropriately.  HEENT: Pupils equal, extraocular movements intact  Respiratory: Patient's speak in full sentences and does not appear short of breath  Cardiovascular: No lower extremity edema, non tender, no erythema  Skin: Warm dry intact with no signs of infection or rash on extremities or on axial skeleton.  Abdomen: Soft nontender  Neuro: Cranial nerves II through XII are intact, neurovascularly intact in all extremities with 2+ DTRs and 2+ pulses.  Lymph: No lymphadenopathy of posterior or  anterior cervical chain or axillae bilaterally.  Gait antalgic gait MSK:  Non tender with full range of motion and good stability and symmetric strength and tone of shoulders, elbows, wrist, hip, knee and ankles bilaterally.  Back Exam:  Inspection: obese. Poor core strength Motion: Flexion 45 deg, Extension 45 deg, Side Bending to 45 deg bilaterally,  Rotation to 45 deg bilaterally  SLR laying: Negative  XSLR laying: Negative  Palpable tenderness: Positive tenderness over the right SI joint as well as the piriformis muscle.Marland Kitchen FABER: Positive for right Sensory change: Gross sensation intact to all lumbar and sacral dermatomes.  Reflexes: 2+ at both patellar tendons, 2+ at achilles tendons, Babinski's downgoing.  Strength at foot  Plantar-flexion: 5/5 Dorsi-flexion: 5/5 Eversion: 5/5 Inversion: 5/5  Leg strength  Quad: 5/5 Hamstring: 5/5 Hip flexor: 5/5 Hip abductors: 5/5   No significant change from previous exam.    Impression and Recommendations:     This case required medical decision making of moderate complexity.

## 2013-07-07 NOTE — Patient Instructions (Addendum)
Good to see you Ice still 2 times a day Call us about Physical therapy and we will refer.  Exercises 3 times a week.  Tennis ball back pocket Try heel lift in right shoe and consider spenco orthotics.  [place] twice daily to area.  Come back in 4 weeks.

## 2013-07-07 NOTE — Assessment & Plan Note (Addendum)
Patient is doing better overall.  Patient though does still have some discomfort from time to time. Patient was given a heel lift and discussed over-the-counter orthotics that could be beneficial. We may need to make patient custom orthotics in the long run but will hold for now. In addition this patient will be sent to formal physical therapy near her house. Patient will be calling us back with a name of a physical therapy place that she has been suggested by a friend. Patient continued the home exercises. We also discussed manual massage with a tennis ball or can be beneficial. Patient will try these interventions and come back again in 4-6 weeks for further evaluation.  Spent greater than 25 minutes with patient face-to-face and had greater than 50% of counseling including as described above in assessment and plan.  Does have history of lumbar back pain and MRI does show some mild impingement of the S1 nerve root. We'll keep this in mind it patient does not a complete resolution of pain we may need to consider epidural steroid injections.

## 2013-08-04 ENCOUNTER — Ambulatory Visit: Payer: Medicare Other | Admitting: Family Medicine

## 2013-08-15 ENCOUNTER — Encounter: Payer: Self-pay | Admitting: Family Medicine

## 2013-08-15 ENCOUNTER — Ambulatory Visit (INDEPENDENT_AMBULATORY_CARE_PROVIDER_SITE_OTHER): Payer: Medicare Other | Admitting: Family Medicine

## 2013-08-15 VITALS — BP 136/78 | HR 70 | Ht 68.0 in | Wt 230.0 lb

## 2013-08-15 DIAGNOSIS — G5700 Lesion of sciatic nerve, unspecified lower limb: Secondary | ICD-10-CM

## 2013-08-15 DIAGNOSIS — G5701 Lesion of sciatic nerve, right lower limb: Secondary | ICD-10-CM

## 2013-08-15 NOTE — Patient Instructions (Signed)
Goo to see you Continue everything you are doing what you are doing! You are doing great Physical therapy will be calling you.  Come back in 6 weeks.

## 2013-08-15 NOTE — Progress Notes (Signed)
  [facility] [address] [place], [place] [address] Phone: [phone] Subjective:     CC: Back pain followup  [id number] Sabrina Taylor is a 68 y.o. female coming in with complaint of back pain. Patient has had a long history of back pain even having a microdiscectomy back in 2012 including  MRI showed postoperative changes at the L4-L5, microdiscectomy with mild right-sided lateral recess stenosis. It does appear on the MRI also to have some mild S1 nerve root impingement. Patient has seen a pain doctor and has had two epidural injections. Patient has been treated for more of a piriformis syndrome by me including range of motion exercises, over-the-counter medications as well as formal physical therapy. Patient states that she did not do physical therapy because whenever set up an appointment. Patient is also doing topical anti-inflammatories, tramadol as needed, and naproxen. Patient states she is doing better than she has done in the year. Patient has been able to do many more activities and she had been doing prior to. Patient states no new symptoms and no radicular symptoms going down her leg are significantly less. Patient states that she's been sleeping more comfortably as well. Overall she is about 80% better.  Past medical history, social, surgical and family history all reviewed in electronic medical record.   Review of Systems: No headache, visual changes, nausea, vomiting, diarrhea, constipation, dizziness, abdominal pain, skin rash, fevers, chills, night sweats, weight loss, swollen lymph nodes, body aches, joint swelling, muscle aches, chest pain, shortness of breath, mood changes.   Objective Blood pressure 136/78, pulse 70, height 5\' 8"  (1.727 m), weight 230 lb (104.327 kg), SpO2 96.00%.  General: No apparent distress alert and oriented x3 mood and affect normal, dressed appropriately.  HEENT: Pupils equal, extraocular movements intact    Respiratory: Patient's speak in full sentences and does not appear short of breath  Cardiovascular: No lower extremity edema, non tender, no erythema  Skin: Warm dry intact with no signs of infection or rash on extremities or on axial skeleton.  Abdomen: Soft nontender  Neuro: Cranial nerves II through XII are intact, neurovascularly intact in all extremities with 2+ DTRs and 2+ pulses.  Lymph: No lymphadenopathy of posterior or anterior cervical chain or axillae bilaterally.  Gait antalgic gait MSK:  Non tender with full range of motion and good stability and symmetric strength and tone of shoulders, elbows, wrist, hip, knee and ankles bilaterally.  Back Exam:  Inspection: obese. Poor core strength Motion: Flexion 45 deg, Extension 45 deg, Side Bending to 45 deg bilaterally,  Rotation to 45 deg bilaterally  SLR laying: Negative  XSLR laying: Negative  Palpable tenderness: Positive tenderness over the right SI joint as well as the piriformis muscle. No significant change. FABER: Positive for right but less severe with some mild increased range of motion. Sensory change: Gross sensation intact to all lumbar and sacral dermatomes.  Reflexes: 2+ at both patellar tendons, 2+ at achilles tendons, Babinski's downgoing.  Strength at foot  Plantar-flexion: 5/5 Dorsi-flexion: 5/5 Eversion: 5/5 Inversion: 5/5  Leg strength  Quad: 5/5 Hamstring: 5/5 Hip flexor: 5/5 Hip abductors: 5/5  Mild improvement from previous exam.    Impression and Recommendations:     This case required medical decision making of moderate complexity.

## 2013-08-15 NOTE — Assessment & Plan Note (Signed)
Overall patient has improved. Patient has increased her range of motion of her hip somewhat as well as strengthening. Encourage her to continue the same exercises at this time but I would like to start formal physical therapy for strengthening of the hip abductors and core strengthening exercises. Patient was given some new exercises including core strengthening exercises as well as sacral mobilization exercises. Patient will dry from [place] [facility] 2 times a week for physical therapy. Patient encouraged to continue everything else she is doing on a regular basis and come back and see me in 6 weeks for further evaluation.  Spent greater than 25 minutes with patient face-to-face and had greater than 50% of counseling including as described above in assessment and plan.

## 2013-08-25 ENCOUNTER — Ambulatory Visit: Payer: Medicare Other | Attending: Family Medicine | Admitting: Physical Therapy

## 2013-08-25 DIAGNOSIS — M25559 Pain in unspecified hip: Secondary | ICD-10-CM | POA: Insufficient documentation

## 2013-08-31 ENCOUNTER — Ambulatory Visit: Payer: Medicare Other | Admitting: Physical Therapy

## 2013-08-31 DIAGNOSIS — M25559 Pain in unspecified hip: Secondary | ICD-10-CM | POA: Diagnosis not present

## 2013-09-02 ENCOUNTER — Ambulatory Visit: Payer: Medicare Other | Admitting: Physical Therapy

## 2013-09-02 DIAGNOSIS — M25559 Pain in unspecified hip: Secondary | ICD-10-CM | POA: Diagnosis not present

## 2013-09-06 ENCOUNTER — Ambulatory Visit: Payer: Medicare Other | Admitting: Physical Therapy

## 2013-09-06 DIAGNOSIS — M25559 Pain in unspecified hip: Secondary | ICD-10-CM | POA: Diagnosis not present

## 2013-09-08 ENCOUNTER — Ambulatory Visit: Payer: Medicare Other | Admitting: Physical Therapy

## 2013-09-08 DIAGNOSIS — M25559 Pain in unspecified hip: Secondary | ICD-10-CM | POA: Diagnosis not present

## 2013-09-13 ENCOUNTER — Ambulatory Visit: Payer: Medicare Other | Admitting: Physical Therapy

## 2013-09-13 DIAGNOSIS — M25559 Pain in unspecified hip: Secondary | ICD-10-CM | POA: Diagnosis not present

## 2013-09-15 ENCOUNTER — Ambulatory Visit: Payer: Medicare Other | Admitting: Physical Therapy

## 2013-09-15 DIAGNOSIS — M25559 Pain in unspecified hip: Secondary | ICD-10-CM | POA: Diagnosis not present

## 2013-09-19 ENCOUNTER — Ambulatory Visit (INDEPENDENT_AMBULATORY_CARE_PROVIDER_SITE_OTHER): Payer: Medicare Other | Admitting: Family Medicine

## 2013-09-19 ENCOUNTER — Encounter: Payer: Self-pay | Admitting: Family Medicine

## 2013-09-19 VITALS — BP 144/80 | HR 72 | Ht 68.0 in | Wt 226.0 lb

## 2013-09-19 DIAGNOSIS — G5700 Lesion of sciatic nerve, unspecified lower limb: Secondary | ICD-10-CM

## 2013-09-19 DIAGNOSIS — G5701 Lesion of sciatic nerve, right lower limb: Secondary | ICD-10-CM

## 2013-09-19 NOTE — Progress Notes (Signed)
  [name] [place], [place] [address] Phone: [phone] Subjective:     CC: Back pain followup  [id number] Sabrina Taylor is a 68 y.o. female coming in with complaint of back pain. Patient has had a long history of back pain even having a microdiscectomy back in 2012 including  MRI showed postoperative changes at the L4-L5, microdiscectomy with mild right-sided lateral recess stenosis. Patient though has been doing very well. Patient was seen previously for more of appear former syndrome and gluteal muscle strain. Patient has been doing formal physical therapy, home exercises, icing and manual massage. Patient is also responded very well to dry needling. Patient states that she is approximately 90 and 95% better at this time. Denies any radiation down her leg or any numbness. Denies any weakness in the leg. Patient can have days where she has no pain whatsoever. Patient is very happy with the results. No new symptoms.  Past medical history, social, surgical and family history all reviewed in electronic medical record.   Review of Systems: No headache, visual changes, nausea, vomiting, diarrhea, constipation, dizziness, abdominal pain, skin rash, fevers, chills, night sweats, weight loss, swollen lymph nodes, body aches, joint swelling, muscle aches, chest pain, shortness of breath, mood changes.   Objective Blood pressure 144/80, pulse 72, height 5\' 8"  (1.727 m), weight 226 lb (102.513 kg), SpO2 95.00%.  General: No apparent distress alert and oriented x3 mood and affect normal, dressed appropriately.  HEENT: Pupils equal, extraocular movements intact  Respiratory: Patient's speak in full sentences and does not appear short of breath  Cardiovascular: No lower extremity edema, non tender, no erythema  Skin: Warm dry intact with no signs of infection or rash on extremities or on axial skeleton.  Abdomen: Soft nontender  Neuro: Cranial nerves II  through XII are intact, neurovascularly intact in all extremities with 2+ DTRs and 2+ pulses.  Lymph: No lymphadenopathy of posterior or anterior cervical chain or axillae bilaterally.  Gait antalgic gait MSK:  Non tender with full range of motion and good stability and symmetric strength and tone of shoulders, elbows, wrist, hip, knee and ankles bilaterally.  Back Exam:  Inspection: obese. Poor core strength still present Motion: Flexion 45 deg, Extension 45 deg, Side Bending to 45 deg bilaterally,  Rotation to 45 deg bilaterally  SLR laying: Negative  XSLR laying: Negative  Palpable tenderness: Nontender  FABER: Positive for right  but continues to improve Sensory change: Gross sensation intact to all lumbar and sacral dermatomes.  Reflexes: 2+ at both patellar tendons, 2+ at achilles tendons, Babinski's downgoing.  Strength at foot  Plantar-flexion: 5/5 Dorsi-flexion: 5/5 Eversion: 5/5 Inversion: 5/5  Leg strength  Quad: 5/5 Hamstring: 5/5 Hip flexor: 5/5 Hip abductors: 5/5     Impression and Recommendations:     This case required medical decision making of moderate complexity.

## 2013-09-19 NOTE — Assessment & Plan Note (Signed)
Continue with current therapy. Patient will finish off the next 3 weeks of formal physical therapy, continue with the icing protocol, continue with the over-the-counter medications. Patient will follow up again in 4 weeks for further evaluation and treatment.

## 2013-09-19 NOTE — Patient Instructions (Signed)
Good to see you You are doing great!!!! Continue physical therapy Ice is your friend I am not going to rock the boat Come back again in 4 weeks if not perfect

## 2013-09-27 ENCOUNTER — Ambulatory Visit: Payer: Medicare Other | Attending: Family Medicine | Admitting: Physical Therapy

## 2013-09-27 DIAGNOSIS — M25559 Pain in unspecified hip: Secondary | ICD-10-CM | POA: Diagnosis not present

## 2013-09-29 ENCOUNTER — Ambulatory Visit: Payer: Medicare Other | Admitting: Physical Therapy

## 2013-09-29 DIAGNOSIS — M25559 Pain in unspecified hip: Secondary | ICD-10-CM | POA: Diagnosis not present

## 2013-10-05 ENCOUNTER — Encounter: Payer: Medicare Other | Admitting: Physical Therapy

## 2013-10-11 ENCOUNTER — Encounter: Payer: Medicare Other | Admitting: Physical Therapy

## 2013-10-17 ENCOUNTER — Ambulatory Visit: Payer: Medicare Other | Admitting: Family Medicine

## 2013-10-21 ENCOUNTER — Ambulatory Visit: Payer: Medicare Other | Admitting: Physical Therapy

## 2013-10-21 DIAGNOSIS — M25559 Pain in unspecified hip: Secondary | ICD-10-CM | POA: Diagnosis not present

## 2013-11-13 ENCOUNTER — Emergency Department (HOSPITAL_COMMUNITY)
Admission: EM | Admit: 2013-11-13 | Discharge: 2013-11-13 | Disposition: A | Discharge status: Home / Self Care | Payer: Medicare Other | Attending: Emergency Medicine | Admitting: Emergency Medicine

## 2013-11-13 ENCOUNTER — Encounter (HOSPITAL_COMMUNITY): Payer: Self-pay | Admitting: Emergency Medicine

## 2013-11-13 DIAGNOSIS — S61209A Unspecified open wound of unspecified finger without damage to nail, initial encounter: Secondary | ICD-10-CM | POA: Insufficient documentation

## 2013-11-13 DIAGNOSIS — W230XXA Caught, crushed, jammed, or pinched between moving objects, initial encounter: Secondary | ICD-10-CM | POA: Insufficient documentation

## 2013-11-13 DIAGNOSIS — S6980XA Other specified injuries of unspecified wrist, hand and finger(s), initial encounter: Secondary | ICD-10-CM | POA: Insufficient documentation

## 2013-11-13 DIAGNOSIS — W208XXA Other cause of strike by thrown, projected or falling object, initial encounter: Secondary | ICD-10-CM | POA: Diagnosis not present

## 2013-11-13 DIAGNOSIS — E669 Obesity, unspecified: Secondary | ICD-10-CM | POA: Diagnosis not present

## 2013-11-13 DIAGNOSIS — IMO0002 Reserved for concepts with insufficient information to code with codable children: Secondary | ICD-10-CM

## 2013-11-13 DIAGNOSIS — Y9229 Other specified public building as the place of occurrence of the external cause: Secondary | ICD-10-CM | POA: Insufficient documentation

## 2013-11-13 DIAGNOSIS — Z880 Allergy status to penicillin: Secondary | ICD-10-CM | POA: Insufficient documentation

## 2013-11-13 DIAGNOSIS — Z791 Long term (current) use of non-steroidal anti-inflammatories (NSAID): Secondary | ICD-10-CM | POA: Diagnosis not present

## 2013-11-13 DIAGNOSIS — S6990XA Unspecified injury of unspecified wrist, hand and finger(s), initial encounter: Secondary | ICD-10-CM | POA: Insufficient documentation

## 2013-11-13 DIAGNOSIS — Z79899 Other long term (current) drug therapy: Secondary | ICD-10-CM | POA: Diagnosis not present

## 2013-11-13 DIAGNOSIS — Z23 Encounter for immunization: Secondary | ICD-10-CM | POA: Diagnosis not present

## 2013-11-13 DIAGNOSIS — I10 Essential (primary) hypertension: Secondary | ICD-10-CM | POA: Diagnosis not present

## 2013-11-13 DIAGNOSIS — E785 Hyperlipidemia, unspecified: Secondary | ICD-10-CM | POA: Diagnosis not present

## 2013-11-13 DIAGNOSIS — Y93G9 Activity, other involving cooking and grilling: Secondary | ICD-10-CM | POA: Diagnosis not present

## 2013-11-13 DIAGNOSIS — I4891 Unspecified atrial fibrillation: Secondary | ICD-10-CM | POA: Diagnosis not present

## 2013-11-13 DIAGNOSIS — Z7982 Long term (current) use of aspirin: Secondary | ICD-10-CM | POA: Insufficient documentation

## 2013-11-13 DIAGNOSIS — S61409A Unspecified open wound of unspecified hand, initial encounter: Secondary | ICD-10-CM | POA: Insufficient documentation

## 2013-11-13 MED ORDER — LIDOCAINE HCL (PF) 2 % IJ SOLN
INTRAMUSCULAR | Status: AC
  Administered 2013-11-13: 2 mL
  Filled 2013-11-13: qty 10

## 2013-11-13 MED ORDER — TETANUS-DIPHTH-ACELL PERTUSSIS 5-2.5-18.5 LF-MCG/0.5 IM SUSP
0.5000 mL | Freq: Once | INTRAMUSCULAR | Status: AC
  Administered 2013-11-13: 0.5 mL via INTRAMUSCULAR
  Filled 2013-11-13: qty 0.5

## 2013-11-13 MED ORDER — LIDOCAINE HCL (PF) 2 % IJ SOLN
2.0000 mL | Freq: Once | INTRAMUSCULAR | Status: AC
  Administered 2013-11-13: 2 mL

## 2013-11-13 NOTE — ED Notes (Signed)
Non-stick dressing placed over wound and wrapped in gauze.

## 2013-11-13 NOTE — ED Notes (Signed)
Pt mashed left middle finger between a serving tray and a sink. Pt has a small cut to her left middle finger. Bleeding controlled in triage.

## 2013-11-13 NOTE — Discharge Instructions (Signed)

## 2013-11-15 NOTE — ED Provider Notes (Signed)
CSN: [id number]     Arrival date & time 11/13/13  1953 History   First MD Initiated Contact with Patient 11/13/13 2025     Chief Complaint  Patient presents with  . Finger Injury     (Consider location/radiation/quality/duration/timing/severity/associated sxs/prior Treatment) The history is provided by the patient and the spouse.   Sabrina Taylor is a 68 y.o. female with complaint of laceration to her left long finger tip incurred when a heavy pain fell on her finger as she was cooking at her church this evening, just prior to arrival.  The wound is small, but bled copiously, stating she was unable to get the bleeding controlled despite compression, until she arrived here.  She denies numbness distal to the injury site and is able to flex and extend the involved finger.  She reports the right finger tip was also caught under the pan and her rings on this finger feel tighter than normal.  She states she has been unable to remove these rings for the past 5 years due to swollen joints.  She is not utd with her tetanus vaccine.     Past Medical History  Diagnosis Date  . PAF (paroxysmal atrial fibrillation)   . LBBB (left bundle branch block)   . First degree AV block   . Hyperlipidemia   . Hypertension   . Obesity   . DDD (degenerative disc disease)     Low back pain  . PONV (postoperative nausea and vomiting)    Past Surgical History  Procedure Laterality Date  . Appendectomy  1960  . Cholecystectomy    . Total abdominal hysterectomy    . Knee arthroscopy  2004, 2008    Right  . Back surgery  may 2011  . Colonoscopy  03/25/2011    Procedure: COLONOSCOPY;  Surgeon: Jamesetta So, MD;  Location: [facility];  Service: Gastroenterology;  Laterality: N/A;   Family History  Problem Relation Age of Onset  . Hypertension Mother   . Alzheimer's disease Mother   . Stroke Father   . Coronary artery disease Brother    History  Substance Use Topics  . Smoking status: Never Smoker    . Smokeless tobacco: Not on file  . Alcohol Use: No   OB History   Grav Para Term Preterm Abortions TAB SAB Ect Mult Living                 Review of Systems  Constitutional: Negative for fever and chills.  Respiratory: Negative for shortness of breath and wheezing.   Musculoskeletal: Positive for arthralgias.  Skin: Positive for wound.  Neurological: Negative for numbness.      Allergies  Penicillins  Home Medications   Prior to Admission medications   Medication Sig Start Date End Date Taking? Authorizing Provider  aspirin 81 MG EC tablet Take 81 mg by mouth daily.      Historical Provider, MD  atorvastatin (LIPITOR) 20 MG tablet take 1 tablet by mouth once daily 01/17/11   Yehuda Savannah, MD  Calcium Carbonate-Vitamin D (CALCIUM PLUS VITAMIN D) 500-50 MG-UNIT CAPS Take by mouth daily.      Historical Provider, MD  cetirizine (ZYRTEC) 10 MG tablet Take 10 mg by mouth daily.      Historical Provider, MD  Cinnamon 500 MG TABS Take by mouth as needed.    Historical Provider, MD  Diclofenac Sodium (PENNSAID) 2 % SOLN Place 2 application onto the skin 2 (two) times daily. 07/07/13  Lyndal Pulley, DO  diltiazem (CARDIZEM CD) 360 MG 24 hr capsule Take 360 mg by mouth daily.      Historical Provider, MD  ibuprofen (ADVIL,MOTRIN) 200 MG tablet Take 200 mg by mouth every 6 (six) hours as needed.    Historical Provider, MD  metFORMIN (GLUCOPHAGE) 500 MG tablet Take 500 mg by mouth daily with breakfast.    Historical Provider, MD  metoprolol succinate (TOPROL-XL) 25 MG 24 hr tablet take 1 tablet by mouth once daily 01/17/11   Yehuda Savannah, MD  naproxen (NAPROSYN) 500 MG tablet Take 1 tablet (500 mg total) by mouth 2 (two) times daily with a meal. 06/21/13   Lyndal Pulley, DO  Omega-3 Fatty Acids (FISH OIL) 1000 MG CAPS Take by mouth. Take 2 tablets daily     Historical Provider, MD  senna (SENOKOT) 8.6 MG tablet Take 2 tablets by mouth daily.      Historical Provider, MD   traMADol (ULTRAM) 50 MG tablet Take by mouth every 6 (six) hours as needed.    Historical Provider, MD  vitamin B-12 (CYANOCOBALAMIN) 250 MCG tablet Take 250 mcg by mouth daily.    Historical Provider, MD  vitamin E 600 UNIT capsule Take 600 Units by mouth daily.      Historical Provider, MD   BP 174/80  Pulse 75  Temp(Src) 98.5 F (36.9 C) (Oral)  Resp 18  Ht 5\' 8"  (1.727 m)  Wt 225 lb (102.059 kg)  BMI 34.22 kg/m2  SpO2 99% Physical Exam  Constitutional: She is oriented to person, place, and time. She appears well-developed and well-nourished.  HENT:  Head: Normocephalic.  Cardiovascular: Normal rate.   Pulmonary/Chest: Effort normal.  Musculoskeletal: She exhibits tenderness.       Left hand: She exhibits laceration. She exhibits normal range of motion, normal two-point discrimination, normal capillary refill and no deformity. Normal sensation noted. Normal strength noted.  Neurological: She is alert and oriented to person, place, and time. No sensory deficit.  Skin: Laceration noted.  0.75  Slightly curved subcutaneous laceration left long distal finger tuft, hemostatic.  Distal sensation intact.     ED Course  Procedures (including critical care time)  LACERATION REPAIR Performed by: Evalee Jefferson Authorized by: Evalee Jefferson Consent: Verbal consent obtained. Risks and benefits: risks, benefits and alternatives were discussed Consent given by: patient Patient identity confirmed: provided demographic data Prepped and Draped in normal sterile fashion Wound explored  Laceration Location: left long finger  Laceration Length: 0.75cm  No Foreign Bodies seen or palpated  Anesthesia:digital block  Local anesthetic: lidocaine 2% without epinephrine  Anesthetic total: 1 ml  Irrigation method: syringe Amount of cleaning: standard  Skin closure: ethilon 4-0 Number of sutures: 3  Technique:simple interrupted Patient tolerance: Patient tolerated the procedure well with  no immediate complications.  Labs Review Labs Reviewed - No data to display  Imaging Review No results found.   EKG Interpretation None      MDM   Final diagnoses:  Laceration    Dressing applied, tetanus updated.  Rings cut from ring finger to help minimize risk of continued swelling/tourniquet complication. Pt was agreeable to this.  Advised ice,  Elevation.  Wound care instructions given.  Pt advised to have sutures removed in 10 days,  Return here sooner for any signs of infection including redness, swelling, worse pain or drainage of pus.       Evalee Jefferson, PA-C 11/15/13 1524

## 2013-11-21 NOTE — ED Provider Notes (Signed)
Medical screening examination/treatment/procedure(s) were performed by non-physician practitioner and as supervising physician I was immediately available for consultation/collaboration.  Lasalle Abee T Tesneem Dufrane, MD 11/21/13 1526 

## 2014-01-21 ENCOUNTER — Encounter (HOSPITAL_COMMUNITY): Payer: Self-pay | Admitting: *Deleted

## 2014-01-21 ENCOUNTER — Emergency Department (HOSPITAL_COMMUNITY): Payer: Medicare Other

## 2014-01-21 ENCOUNTER — Emergency Department (HOSPITAL_COMMUNITY)
Admission: EM | Admit: 2014-01-21 | Discharge: 2014-01-22 | Disposition: A | Discharge status: Home / Self Care | Payer: Medicare Other | Attending: Emergency Medicine | Admitting: Emergency Medicine

## 2014-01-21 DIAGNOSIS — S40022A Contusion of left upper arm, initial encounter: Secondary | ICD-10-CM

## 2014-01-21 DIAGNOSIS — Z880 Allergy status to penicillin: Secondary | ICD-10-CM | POA: Insufficient documentation

## 2014-01-21 DIAGNOSIS — S4992XA Unspecified injury of left shoulder and upper arm, initial encounter: Secondary | ICD-10-CM | POA: Diagnosis present

## 2014-01-21 DIAGNOSIS — Y998 Other external cause status: Secondary | ICD-10-CM | POA: Insufficient documentation

## 2014-01-21 DIAGNOSIS — E785 Hyperlipidemia, unspecified: Secondary | ICD-10-CM | POA: Diagnosis not present

## 2014-01-21 DIAGNOSIS — Z79899 Other long term (current) drug therapy: Secondary | ICD-10-CM | POA: Insufficient documentation

## 2014-01-21 DIAGNOSIS — I10 Essential (primary) hypertension: Secondary | ICD-10-CM | POA: Diagnosis not present

## 2014-01-21 DIAGNOSIS — S40012A Contusion of left shoulder, initial encounter: Secondary | ICD-10-CM | POA: Insufficient documentation

## 2014-01-21 DIAGNOSIS — S4990XA Unspecified injury of shoulder and upper arm, unspecified arm, initial encounter: Secondary | ICD-10-CM

## 2014-01-21 DIAGNOSIS — Y929 Unspecified place or not applicable: Secondary | ICD-10-CM | POA: Diagnosis not present

## 2014-01-21 DIAGNOSIS — Z7982 Long term (current) use of aspirin: Secondary | ICD-10-CM | POA: Diagnosis not present

## 2014-01-21 DIAGNOSIS — W01198A Fall on same level from slipping, tripping and stumbling with subsequent striking against other object, initial encounter: Secondary | ICD-10-CM | POA: Insufficient documentation

## 2014-01-21 DIAGNOSIS — Y9389 Activity, other specified: Secondary | ICD-10-CM | POA: Diagnosis not present

## 2014-01-21 DIAGNOSIS — E669 Obesity, unspecified: Secondary | ICD-10-CM | POA: Insufficient documentation

## 2014-01-21 DIAGNOSIS — Z791 Long term (current) use of non-steroidal anti-inflammatories (NSAID): Secondary | ICD-10-CM | POA: Diagnosis not present

## 2014-01-21 NOTE — ED Notes (Signed)
Pt c/o left shoulder pain; pt states she was walking her dog and tripped and fell on left shoulder; radial pulse present, no obvious deformity, pt states it feels better if bent at elbow and she holds it

## 2014-01-22 MED ORDER — ONDANSETRON HCL 4 MG PO TABS
4.0000 mg | ORAL_TABLET | Freq: Once | ORAL | Status: AC
  Administered 2014-01-22: 4 mg via ORAL
  Filled 2014-01-22: qty 1

## 2014-01-22 MED ORDER — HYDROCODONE-ACETAMINOPHEN 5-325 MG PO TABS
2.0000 | ORAL_TABLET | Freq: Once | ORAL | Status: AC
  Administered 2014-01-22: 2 via ORAL
  Filled 2014-01-22: qty 2

## 2014-01-22 MED ORDER — HYDROCODONE-ACETAMINOPHEN 5-325 MG PO TABS: 1.0000 | ORAL_TABLET | ORAL | Status: DC | PRN

## 2014-01-22 NOTE — ED Notes (Signed)
Pt offered wheel chair several times and she said she was fine. Pt walked out with husband with ice pack and sling on her left shoulder.

## 2014-01-22 NOTE — Discharge Instructions (Signed)
Your xray is negative for fracture or dislocation. Please use the sling for the next 5 days. Use tylenol or ibuprofen for mild soreness. Use norco for more severe pain. See Dr Earnest Conroy. Smith for orthopedic evaluation if not improving.

## 2014-01-22 NOTE — ED Provider Notes (Signed)
CSN: [id number]     Arrival date & time 01/21/14  2219 History   First MD Initiated Contact with Patient 01/21/14 2328     Chief Complaint  Patient presents with  . Arm Injury     (Consider location/radiation/quality/duration/timing/severity/associated sxs/prior Treatment) HPI Comments: Pt states she was walking her dog when she fell and injured the left shoulder. She has pain that is sharp and increasing.. Pain with certain movements. No previous hx of operations or procedures involving the left shoulder. No hx of use of anticoag meds. No other injury reported. No LOC. No SOB. No reported blood in urine or stool since the fall. Pt has not taken meds for this problem. The pain is worse with movement.  The history is provided by the patient.    Past Medical History  Diagnosis Date  . PAF (paroxysmal atrial fibrillation)   . LBBB (left bundle branch block)   . First degree AV block   . Hyperlipidemia   . Hypertension   . Obesity   . DDD (degenerative disc disease)     Low back pain  . PONV (postoperative nausea and vomiting)    Past Surgical History  Procedure Laterality Date  . Appendectomy  1960  . Cholecystectomy    . Total abdominal hysterectomy    . Knee arthroscopy  2004, 2008    Right  . Back surgery  may 2011  . Colonoscopy  03/25/2011    Procedure: COLONOSCOPY;  Surgeon: Jamesetta So, MD;  Location: [facility];  Service: Gastroenterology;  Laterality: N/A;   Family History  Problem Relation Age of Onset  . Hypertension Mother   . Alzheimer's disease Mother   . Stroke Father   . Coronary artery disease Brother    History  Substance Use Topics  . Smoking status: Never Smoker   . Smokeless tobacco: Not on file  . Alcohol Use: No   OB History    No data available     Review of Systems  Constitutional: Negative for activity change.       All ROS Neg except as noted in HPI  HENT: Negative for nosebleeds.   Eyes: Negative for photophobia and discharge.   Respiratory: Negative for cough, shortness of breath and wheezing.   Cardiovascular: Negative for chest pain and palpitations.  Gastrointestinal: Negative for abdominal pain and blood in stool.  Genitourinary: Negative for dysuria, frequency and hematuria.  Musculoskeletal: Negative for back pain, arthralgias and neck pain.  Skin: Negative.   Neurological: Negative for dizziness, seizures and speech difficulty.  Psychiatric/Behavioral: Negative for hallucinations and confusion.      Allergies  Penicillins  Home Medications   Prior to Admission medications   Medication Sig Start Date End Date Taking? Authorizing Provider  aspirin 81 MG EC tablet Take 81 mg by mouth daily.     Yes Historical Provider, MD  atorvastatin (LIPITOR) 20 MG tablet take 1 tablet by mouth once daily 01/17/11  Yes Yehuda Savannah, MD  Calcium Carbonate-Vitamin D (CALCIUM PLUS VITAMIN D) 500-50 MG-UNIT CAPS Take 1 tablet by mouth daily.    Yes Historical Provider, MD  cetirizine (ZYRTEC) 10 MG tablet Take 10 mg by mouth daily.     Yes Historical Provider, MD  Cinnamon 500 MG TABS Take 500 mg by mouth daily.    Yes Historical Provider, MD  diltiazem (CARDIZEM CD) 360 MG 24 hr capsule Take 360 mg by mouth daily.     Yes Historical Provider, MD  gabapentin (  NEURONTIN) 300 MG capsule Take 300 mg by mouth at bedtime. 12/19/13  Yes Historical Provider, MD  ibuprofen (ADVIL,MOTRIN) 200 MG tablet Take 400 mg by mouth every 6 (six) hours as needed for mild pain.    Yes Historical Provider, MD  metFORMIN (GLUCOPHAGE) 500 MG tablet Take 500 mg by mouth daily with breakfast.   Yes Historical Provider, MD  metoprolol succinate (TOPROL-XL) 25 MG 24 hr tablet take 1 tablet by mouth once daily 01/17/11  Yes Yehuda Savannah, MD  Omega-3 Fatty Acids (FISH OIL) 1000 MG CAPS Take 1 capsule by mouth daily.    Yes Historical Provider, MD  senna (SENOKOT) 8.6 MG tablet Take 2 tablets by mouth daily.     Yes Historical Provider, MD   TURMERIC PO Take 1 capsule by mouth daily.   Yes Historical Provider, MD  vitamin B-12 (CYANOCOBALAMIN) 250 MCG tablet Take 250 mcg by mouth daily.   Yes Historical Provider, MD  vitamin E 600 UNIT capsule Take 600 Units by mouth daily.     Yes Historical Provider, MD  Diclofenac Sodium (PENNSAID) 2 % SOLN Place 2 application onto the skin 2 (two) times daily. Patient not taking: Reported on 01/21/2014 07/07/13   Lyndal Pulley, DO  naproxen (NAPROSYN) 500 MG tablet Take 1 tablet (500 mg total) by mouth 2 (two) times daily with a meal. Patient not taking: Reported on 01/21/2014 06/21/13   Lyndal Pulley, DO   BP 189/72 mmHg  Pulse 64  Temp(Src) 98.2 F (36.8 C) (Oral)  Resp 20  Ht 5\' 8"  (1.727 m)  Wt 225 lb (102.059 kg)  BMI 34.22 kg/m2  SpO2 97% Physical Exam  Constitutional: She is oriented to person, place, and time. She appears well-developed and well-nourished.  Non-toxic appearance.  HENT:  Head: Normocephalic.  Right Ear: Tympanic membrane and external ear normal.  Left Ear: Tympanic membrane and external ear normal.  Eyes: EOM and lids are normal. Pupils are equal, round, and reactive to light.  Neck: Normal range of motion. Neck supple. Carotid bruit is not present.  Cardiovascular: Normal rate, regular rhythm, normal heart sounds, intact distal pulses and normal pulses.   Pulmonary/Chest: Breath sounds normal. No respiratory distress.  Abdominal: Soft. Bowel sounds are normal. There is no tenderness. There is no guarding.  Musculoskeletal:       Left shoulder: She exhibits decreased range of motion, tenderness, bony tenderness and pain. She exhibits no swelling, no effusion and no deformity.  Lymphadenopathy:       Head (right side): No submandibular adenopathy present.       Head (left side): No submandibular adenopathy present.    She has no cervical adenopathy.  Neurological: She is alert and oriented to person, place, and time. She has normal strength. No cranial  nerve deficit or sensory deficit.  No gross neuro deficit.   Skin: Skin is warm and dry.  Psychiatric: She has a normal mood and affect. Her speech is normal.  Nursing note and vitals reviewed.   ED Course  Procedures (including critical care time) Labs Review Labs Reviewed - No data to display  Imaging Review Dg Shoulder Left  01/22/2014   CLINICAL DATA:  Acute onset of left shoulder pain, status post fall in yard this evening. Initial encounter.  EXAM: LEFT SHOULDER - 2+ VIEW  COMPARISON:  None.  FINDINGS: There is no evidence of fracture or dislocation. The left humeral head is seated within the glenoid fossa. Minimal degenerative change is noted  at the left acromioclavicular joint. No significant soft tissue abnormalities are seen. The visualized portions of the left lung are clear.  IMPRESSION: No evidence of fracture or dislocation.   Electronically Signed   By: Garald Balding M.D.   On: 01/22/2014 00:02     EKG Interpretation None      MDM  Compartments are soft.  No vascular compromise noted. Xray of the left shoulder is negative for fx or dislocation. Pt fitted with a sling. Rx for norco given to the patient with instruction to change positions slowly due to possible drowsiness.   Final diagnoses:  Contusion shoulder/arm, left, initial encounter    *I have reviewed nursing notes, vital signs, and all appropriate lab and imaging results for this patient.**    Lenox Ahr, PA-C 01/22/14 0045  Wynetta Fines, MD 01/22/14 [phone]

## 2014-01-30 ENCOUNTER — Ambulatory Visit (INDEPENDENT_AMBULATORY_CARE_PROVIDER_SITE_OTHER): Payer: Medicare Other | Admitting: Family Medicine

## 2014-01-30 ENCOUNTER — Encounter: Payer: Self-pay | Admitting: Family Medicine

## 2014-01-30 ENCOUNTER — Other Ambulatory Visit (INDEPENDENT_AMBULATORY_CARE_PROVIDER_SITE_OTHER): Payer: Medicare Other

## 2014-01-30 VITALS — BP 130/78 | HR 70 | Ht 68.0 in | Wt 220.0 lb

## 2014-01-30 DIAGNOSIS — M25512 Pain in left shoulder: Secondary | ICD-10-CM

## 2014-01-30 DIAGNOSIS — S46002A Unspecified injury of muscle(s) and tendon(s) of the rotator cuff of left shoulder, initial encounter: Secondary | ICD-10-CM | POA: Insufficient documentation

## 2014-01-30 MED ORDER — TRAMADOL HCL 50 MG PO TABS: 50.0000 mg | ORAL_TABLET | Freq: Two times a day (BID) | ORAL | Status: DC

## 2014-01-30 NOTE — Assessment & Plan Note (Signed)
Patient had an injection today with some increasing range of motion and decreasing pain. This make me optimistic. I do still see an acute on chronic tear of the supraspinatus that could be contributing. We discussed icing, home exercises, topical anti-inflammatories. Patient is going to try to make these changes and an come back and see me again in 1-2 weeks. If patient has continued have pain I would consider ultrasound again. If any retraction is noted we may need to consider further aggressive imaging or intervention.   Spent greater than 25 minutes with patient face-to-face and had greater than 50% of counseling including as described above in assessment and plan.

## 2014-01-30 NOTE — Patient Instructions (Signed)
Good to see you Ice 20 minutes 2 times daily. Usually after activity and before bed. Exercises 3 times a week.  Tramadol when you need it.  Pennsaid to try.  See me again in 10 days and we will see if you are healing.

## 2014-01-30 NOTE — Progress Notes (Signed)
[name] [address] [place], [place] [address] Phone: [phone] Subjective:     CC: Left arm pain;.  NTI:[id number] Sabrina Taylor is a 68 y.o. female coming in with complaint of left arm pain. On 01/21/2014 patient was walking her dog and fell and injured her left shoulder. Patient states she started having sharp pain that seem to be increasing. Patient went into the emergency department for further evaluation. Patient states any type of movement seems to exacerbate it. Patient denies any significant radiation down the arm or any numbness or tingling. States she states still cannot move her arm completely. Has pain with certain movements especially abduction.    x-rays from 01/22/2014 showed no signs of dislocation with minimal degenerative changes of the acromioclavicular joint.  Past medical history, social, surgical and family history all reviewed in electronic medical record.   Review of Systems: No headache, visual changes, nausea, vomiting, diarrhea, constipation, dizziness, abdominal pain, skin rash, fevers, chills, night sweats, weight loss, swollen lymph nodes, body aches, joint swelling, muscle aches, chest pain, shortness of breath, mood changes.   Objective Blood pressure 130/78, pulse 70, height 5\' 8"  (1.727 m), weight 220 lb (99.791 kg), SpO2 97 %.  General: No apparent distress alert and oriented x3 mood and affect normal, dressed appropriately.  HEENT: Pupils equal, extraocular movements intact  Respiratory: Patient's speak in full sentences and does not appear short of breath  Cardiovascular: No lower extremity edema, non tender, no erythema  Skin: Warm dry intact with no signs of infection or rash on extremities or on axial skeleton.  Abdomen: Soft nontender  Neuro: Cranial nerves II through XII are intact, neurovascularly intact in all extremities with 2+ DTRs and 2+ pulses.  Lymph: No lymphadenopathy of posterior or anterior cervical  chain or axillae bilaterally.  Gait normal with good balance and coordination.  MSK:  Non tender with full range of motion and good stability and symmetric strength and tone of  elbows, wrist, hip, knee and ankles bilaterally.  Shoulder: Left Inspection reveals no abnormalities, atrophy or asymmetry. Palpation is normal with no tenderness over AC joint or bicipital groove. Decreased active range of motion but does have full passive range of motion. Rotator cuff strength 3 out of 5 compared to 5-5 on the contralateral side. Positive impingement Speeds and Yergason's tests normal. No labral pathology noted with negative Obrien's, negative clunk and good stability. Normal scapular function observed. No painful arc and no drop arm sign. No apprehension sign  MSK US performed of: Left shoulder This study was ordered, performed, and interpreted by Charlann Boxer D.O.  Shoulder:   Supraspinatus:  Patient does have significant hypoechoic changes and increasing Doppler flow and what appears to be an acute on chronic degenerative tear with no retraction. This is mostly intersubstance. Less than 30% of the tendon is affected. Infraspinatus:  Appears normal on long and transverse views. Subscapularis:  Appears normal on long and transverse views. Moderate hypoechoic changes that is consistent with contusion Teres Minor:  Appears normal on long and transverse views. AC joint:  Capsule undistended, no geyser sign. Glenohumeral Joint:  Mild to moderate arthritis Glenoid Labrum:  Difficult to assess Biceps Tendon:  Hypoechoic changes noted but no tear Impression: Acute on chronic rotator cuff tear with rotator cuff contusion.   Procedure: Real-time Ultrasound Guided Injection of left glenohumeral joint Device: GE Logiq E  Ultrasound guided injection is preferred based studies that show increased duration, increased effect, greater accuracy,  decreased procedural pain, increased response rate with  ultrasound guided versus blind injection.  Verbal informed consent obtained.  Time-out conducted.  Noted no overlying erythema, induration, or other signs of local infection.  Skin prepped in a sterile fashion.  Local anesthesia: Topical Ethyl chloride.  With sterile technique and under real time ultrasound guidance:  Joint visualized.  23g 1  inch needle inserted posterior approach. Pictures taken for needle placement. Patient did have injection of 2 cc of 1% lidocaine, 2 cc of 0.5% Marcaine, and 1cc of Kenalog 40 mg/dL. Completed without difficulty  Pain immediately resolved suggesting accurate placement of the medication.  Advised to call if fevers/chills, erythema, induration, drainage, or persistent bleeding.  Images permanently stored and available for review in the ultrasound unit.  Impression: Technically successful ultrasound guided injection.     Impression and Recommendations:     This case required medical decision making of moderate complexity.

## 2014-02-10 ENCOUNTER — Encounter: Payer: Self-pay | Admitting: Family Medicine

## 2014-02-10 ENCOUNTER — Ambulatory Visit (INDEPENDENT_AMBULATORY_CARE_PROVIDER_SITE_OTHER): Payer: Medicare Other | Admitting: Family Medicine

## 2014-02-10 ENCOUNTER — Other Ambulatory Visit (INDEPENDENT_AMBULATORY_CARE_PROVIDER_SITE_OTHER): Payer: Medicare Other

## 2014-02-10 VITALS — BP 134/72 | HR 69 | Ht 68.0 in | Wt 217.0 lb

## 2014-02-10 DIAGNOSIS — M25512 Pain in left shoulder: Secondary | ICD-10-CM

## 2014-02-10 DIAGNOSIS — S46002D Unspecified injury of muscle(s) and tendon(s) of the rotator cuff of left shoulder, subsequent encounter: Secondary | ICD-10-CM

## 2014-02-10 NOTE — Patient Instructions (Addendum)
Good to see you Ice is still your friend.  Happy holidays! Continue the exercises 3 times a week.  Call me if it worsens and we will get MRI especially if increasing weakness.  See me again in 2-3 weeks otherwise.

## 2014-02-10 NOTE — Assessment & Plan Note (Signed)
Discuss again at great length. We discussed that patient does have a rotator cuff tear and may need surgical repair. We discussed the possibility of getting an MRI. Patient declined. At this time we are going to give patient another 2-3 weeks of conservative therapy. I do not feel that she will do well and formal physical therapy at this time but in 2-3 weeks if she's made some improvement we will refer. Otherwise patient has not made any significant improvement I would encourage an MRI for further evaluation. I would do this because then patient would at least have a decision to make if surgical repair is necessary. If weight to long surgery would not be an option. We discussed continuing the icing, home exercises and tramadol for breakthrough pain. She'll come back in 2-3 weeks.  Spent greater than 25 minutes with patient face-to-face and had greater than 50% of counseling including as described above in assessment and plan.

## 2014-02-10 NOTE — Progress Notes (Signed)
[name] [address] [place], [place] [address] Phone: [phone] Subjective:     CC: Left arm pain;.  [id number] Sabrina Taylor is a 68 y.o. female coming in with complaint of left arm pain. On 01/21/2014 patient was walking her dog and fell and injured her left shoulder. Patient was seen previously and was diagnosed with acute on chronic rotator cuff tear with a rotator cuff contusion. Patient wanted to consider conservative therapy and we did do an ultrasound guided injection. Since then patient was to do home exercises, icing protocol, and did have tramadol for any breakthrough pain. Patient states that the pain is significant better by 65% but continues to have the weakness. Patient states even easy activity such as lifting a milk carton is difficult for her to do with the left arm. Patient denies any nighttime awakenings and states that activities of daily living she can do fairly well. No new symptoms.    x-rays from 01/22/2014 showed no signs of dislocation with minimal degenerative changes of the acromioclavicular joint.  Past medical history, social, surgical and family history all reviewed in electronic medical record.   Review of Systems: No headache, visual changes, nausea, vomiting, diarrhea, constipation, dizziness, abdominal pain, skin rash, fevers, chills, night sweats, weight loss, swollen lymph nodes, body aches, joint swelling, muscle aches, chest pain, shortness of breath, mood changes.   Objective Blood pressure 134/72, pulse 69, height 5\' 8"  (1.727 m), weight 217 lb (98.431 kg), SpO2 97 %.  General: No apparent distress alert and oriented x3 mood and affect normal, dressed appropriately.  HEENT: Pupils equal, extraocular movements intact  Respiratory: Patient's speak in full sentences and does not appear short of breath  Cardiovascular: No lower extremity edema, non tender, no erythema  Skin: Warm dry intact with no signs of  infection or rash on extremities or on axial skeleton.  Abdomen: Soft nontender  Neuro: Cranial nerves II through XII are intact, neurovascularly intact in all extremities with 2+ DTRs and 2+ pulses.  Lymph: No lymphadenopathy of posterior or anterior cervical chain or axillae bilaterally.  Gait normal with good balance and coordination.  MSK:  Non tender with full range of motion and good stability and symmetric strength and tone of  elbows, wrist, hip, knee and ankles bilaterally.  Shoulder: Left Inspection reveals no abnormalities, atrophy or asymmetry. Palpation is normal with no tenderness over AC joint or bicipital groove. Decreased active range of motion but does have full passive range of motion. Rotator cuff strength 3+ out of 5 compared to 5-5 on the contralateral side. Positive impingement Speeds and Yergason's tests normal. No labral pathology noted with negative Obrien's, negative clunk and good stability. Normal scapular function observed. No painful arc and no drop arm sign. No apprehension sign  MSK US performed of: Left shoulder This study was ordered, performed, and interpreted by Charlann Boxer D.O.  Shoulder:   Supraspinatus:  Patient does not have any significant retraction but still has significant intersubstance tearing. Mild scarring tissue formation is noted. Infraspinatus:  Appears normal on long and transverse views. Subscapularis:  With hypoechoic changes decreased it is possible to see the patient does have a rotator cuff tear. Scar tissue formation is noted with calcific changes. Teres Minor:  Appears normal on long and transverse views. AC joint:  Capsule undistended, no geyser sign. Glenohumeral Joint:  Mild to moderate arthritis Glenoid Labrum:  Difficult to assess Biceps Tendon:  Hypoechoic changes noted but no tear Impression:  Continued tear noted of the rotator cuff with some interval healing but minimal.        Impression and Recommendations:       This case required medical decision making of moderate complexity.

## 2014-03-01 ENCOUNTER — Encounter: Payer: Self-pay | Admitting: Family Medicine

## 2014-03-01 ENCOUNTER — Other Ambulatory Visit (INDEPENDENT_AMBULATORY_CARE_PROVIDER_SITE_OTHER): Payer: Medicare Other

## 2014-03-01 ENCOUNTER — Ambulatory Visit (INDEPENDENT_AMBULATORY_CARE_PROVIDER_SITE_OTHER): Payer: Medicare Other | Admitting: Family Medicine

## 2014-03-01 VITALS — BP 140/72 | HR 68 | Wt 217.0 lb

## 2014-03-01 DIAGNOSIS — M25512 Pain in left shoulder: Secondary | ICD-10-CM

## 2014-03-01 DIAGNOSIS — S46002D Unspecified injury of muscle(s) and tendon(s) of the rotator cuff of left shoulder, subsequent encounter: Secondary | ICD-10-CM

## 2014-03-01 NOTE — Progress Notes (Signed)
  [facility] [address] [place], [place] [address] Phone: [phone] Subjective:     CC: Left arm pain;.  [id number] Sabrina Taylor is a 69 y.o. female coming in with complaint of left arm pain. On 01/21/2014 patient was walking her dog and fell and injured her left shoulder. Patient was seen previously and was diagnosed with acute on chronic rotator cuff tear with a rotator cuff contusion. Patient wanted to consider conservative therapy and we did do an ultrasound guided injection. Patient is continue conservative therapy and states that she is approximately 75% better. Patient is actually having increasing strength. Patient states she can do her daily activities with very mild discomfort. Denies any nighttime awakening. Patient overall feels like she continues to improve daily.    x-rays from 01/22/2014 showed no signs of dislocation with minimal degenerative changes of the acromioclavicular joint.  Past medical history, social, surgical and family history all reviewed in electronic medical record.   Review of Systems: No headache, visual changes, nausea, vomiting, diarrhea, constipation, dizziness, abdominal pain, skin rash, fevers, chills, night sweats, weight loss, swollen lymph nodes, body aches, joint swelling, muscle aches, chest pain, shortness of breath, mood changes.   Objective Blood pressure 140/72, pulse 68, weight 217 lb (98.431 kg), SpO2 96 %.  General: No apparent distress alert and oriented x3 mood and affect normal, dressed appropriately.  HEENT: Pupils equal, extraocular movements intact  Respiratory: Patient's speak in full sentences and does not appear short of breath  Cardiovascular: No lower extremity edema, non tender, no erythema  Skin: Warm dry intact with no signs of infection or rash on extremities or on axial skeleton.  Abdomen: Soft nontender  Neuro: Cranial nerves II through XII are intact, neurovascularly intact in all  extremities with 2+ DTRs and 2+ pulses.  Lymph: No lymphadenopathy of posterior or anterior cervical chain or axillae bilaterally.  Gait normal with good balance and coordination.  MSK:  Non tender with full range of motion and good stability and symmetric strength and tone of  elbows, wrist, hip, knee and ankles bilaterally.  Shoulder: Left Inspection reveals no abnormalities, atrophy or asymmetry. Palpation is normal with no tenderness over AC joint or bicipital groove. Decreased active range of motion but does have full passive range of motion. Rotator cuff strength 4+ out of 5 compared to 5-5 on the contralateral side. Positive impingement Speeds and Yergason's tests normal. No labral pathology noted with negative Obrien's, negative clunk and good stability. Normal scapular function observed. No painful arc and no drop arm sign. No apprehension sign  MSK US performed of: Left shoulder This study was ordered, performed, and interpreted by Charlann Boxer D.O.  Shoulder:   Supraspinatus:  Patient's intrasubstance tearing is significantly improved. Scar tissue formation noted  Infraspinatus:  Appears normal on long and transverse views. Subscapularis:   Scar tissue formation is noted with calcific changes. Continue interval improvement with no retraction Teres Minor:  Appears normal on long and transverse views. AC joint:  Capsule undistended, no geyser sign. Glenohumeral Joint:  Mild to moderate arthritis Glenoid Labrum:  Difficult to assess Biceps Tendon:  Hypoechoic changes noted but no tear Impression: Continued healing of the rotator cuff with no worsening of retraction.        Impression and Recommendations:     This case required medical decision making of moderate complexity.

## 2014-03-01 NOTE — Patient Instructions (Signed)
You are doing great!!!!! It does appear you are healing.  Ice is your friend Continue exercises 3 times a week.  Ibuprofen or tramadol when you need See me again in 4 weeks.

## 2014-03-01 NOTE — Assessment & Plan Note (Signed)
I am impressed by patient improving. We see improvement on physical exam as well as ultrasound today. Encourage patient to continue the home exercises and using tramadol for any breakthrough pain. Patient will call sooner if she does have any worsening symptoms or increasing weakness. At this point I think patient is going to avoid any surgical intervention. Patient can do daily activities and overall seems to be relatively happy with the results. We discussed which activities to avoid and it showed proper form. Patient come back in 4 weeks for further evaluation.  Spent greater than 25 minutes with patient face-to-face and had greater than 50% of counseling including as described above in assessment and plan.

## 2014-04-03 ENCOUNTER — Encounter: Payer: Self-pay | Admitting: Family Medicine

## 2014-04-03 ENCOUNTER — Other Ambulatory Visit (INDEPENDENT_AMBULATORY_CARE_PROVIDER_SITE_OTHER): Payer: Medicare Other

## 2014-04-03 ENCOUNTER — Ambulatory Visit (INDEPENDENT_AMBULATORY_CARE_PROVIDER_SITE_OTHER): Payer: Medicare Other | Admitting: Family Medicine

## 2014-04-03 VITALS — BP 132/80 | HR 69 | Ht 68.0 in | Wt 215.0 lb

## 2014-04-03 DIAGNOSIS — S46002D Unspecified injury of muscle(s) and tendon(s) of the rotator cuff of left shoulder, subsequent encounter: Secondary | ICD-10-CM

## 2014-04-03 DIAGNOSIS — M25512 Pain in left shoulder: Secondary | ICD-10-CM

## 2014-04-03 NOTE — Assessment & Plan Note (Signed)
Patient continues to improve at this time. I do think that another injection in 4 weeks' could be beneficial. Encourage her to continue the home exercises at this time. Patient declined formal physical therapy. We discussed continuing the icing and patient was given trial size of topical anti-inflammatories again. Patient and will come back and see me again in 4 weeks for further evaluation. Continuing have pain we may went to consider 1 more intra-articular injection.

## 2014-04-03 NOTE — Patient Instructions (Addendum)
Good to see you Sabrina Taylor is your friend.  Continue the exercises, you are doing great! Pennsaid up to 2 times daily.  See me again in 4 weeks.  At that time if still in pain we will consider an injection.

## 2014-04-03 NOTE — Progress Notes (Signed)
[name] [address] [place], [place] [address] Phone: [phone] Subjective:     CC: Left arm pain;.  Sabrina Taylor is a 69 y.o. female coming in with complaint of left arm pain. On 01/21/2014 patient was walking her dog and fell and injured her left shoulder. Patient was seen previously and was diagnosed with acute on chronic rotator cuff tear with a rotator cuff contusion. Patient wanted to consider conservative therapy and we did do an ultrasound guided injection. Patient is continue conservative therapy and states that she is approximately 85% better. She continues to improve but still has some pain with internal rotation. Still weaker than her other side as well. States that at night she can sometimes wake up secondary to rolling on that side. Patient though states that she is happy with results and can do all activities of daily living without any significant discomfort.   x-rays from 01/22/2014 showed no signs of dislocation with minimal degenerative changes of the acromioclavicular joint.  Past medical history, social, surgical and family history all reviewed in electronic medical record.   Review of Systems: No headache, visual changes, nausea, vomiting, diarrhea, constipation, dizziness, abdominal pain, skin rash, fevers, chills, night sweats, weight loss, swollen lymph nodes, body aches, joint swelling, muscle aches, chest pain, shortness of breath, mood changes.   Objective Blood pressure 132/80, pulse 69, height 5\' 8"  (1.727 m), weight 215 lb (97.523 kg), SpO2 96 %.  General: No apparent distress alert and oriented x3 mood and affect normal, dressed appropriately.  HEENT: Pupils equal, extraocular movements intact  Respiratory: Patient's speak in full sentences and does not appear short of breath  Cardiovascular: No lower extremity edema, non tender, no erythema  Skin: Warm dry intact with no signs of infection or rash on  extremities or on axial skeleton.  Abdomen: Soft nontender  Neuro: Cranial nerves II through XII are intact, neurovascularly intact in all extremities with 2+ DTRs and 2+ pulses.  Lymph: No lymphadenopathy of posterior or anterior cervical chain or axillae bilaterally.  Gait normal with good balance and coordination.  MSK:  Non tender with full range of motion and good stability and symmetric strength and tone of  elbows, wrist, hip, knee and ankles bilaterally.  Shoulder: Left Inspection reveals no abnormalities, atrophy or asymmetry. Palpation is normal with no tenderness over AC joint or bicipital groove. Decreased active range of motion but does have full passive range of motion. Rotator cuff strength 4+ out of 5 compared to 5-5 on the contralateral side. Positive impingement Speeds and Yergason's tests normal. No labral pathology noted with negative Obrien's, negative clunk and good stability. Normal scapular function observed. No painful arc and no drop arm sign. No apprehension sign  MSK US performed of: Left shoulder This study was ordered, performed, and interpreted by Charlann Boxer D.O.  Shoulder:   Supraspinatus:  Continued improvement. Patient does have slight atrophy of the muscle itself. There appears no at insertion seems to be doing relatively well. Infraspinatus:  Appears normal on long and transverse views. Subscapularis:   continued improvement with no retraction.  Teres Minor:  Appears normal on long and transverse views. AC joint:  Capsule undistended, no geyser sign. Glenohumeral Joint:  Mild to moderate arthritis Glenoid Labrum:  Difficult to assess Biceps Tendon:  Hypoechoic changes noted but no tear Impression: Continued healing of the rotator cuff         Impression and Recommendations:     This  case required medical decision making of moderate complexity.

## 2014-04-03 NOTE — Progress Notes (Signed)
Pre-visit discussion using our clinic review tool. No additional management support is needed unless otherwise documented below in the visit note.  

## 2014-05-01 ENCOUNTER — Ambulatory Visit (INDEPENDENT_AMBULATORY_CARE_PROVIDER_SITE_OTHER): Payer: Medicare Other | Admitting: Family Medicine

## 2014-05-01 ENCOUNTER — Encounter: Payer: Self-pay | Admitting: Family Medicine

## 2014-05-01 VITALS — BP 146/72 | HR 65 | Ht 68.0 in | Wt 215.0 lb

## 2014-05-01 DIAGNOSIS — S46002D Unspecified injury of muscle(s) and tendon(s) of the rotator cuff of left shoulder, subsequent encounter: Secondary | ICD-10-CM

## 2014-05-01 NOTE — Progress Notes (Signed)
Pre visit review using our clinic review tool, if applicable. No additional management support is needed unless otherwise documented below in the visit note. 

## 2014-05-01 NOTE — Patient Instructions (Signed)
Good to see you You are doing incredible.  Ice is still your friend.  Continue the exercises See me again in 8 weeks, we will ultrasound and discuss injeciton if needed.

## 2014-05-01 NOTE — Progress Notes (Signed)
  [name] [place] [place], [place] [address] Phone: [phone] Subjective:     CC: Left arm pain .  [id number] Sabrina Taylor is a 69 y.o. female coming in with complaint of left arm pain. Patient was found to have a rotator cuff tear. Patient has tried some conservative therapy at this point. Patient states she continues to improve and states that she is 90% better. Patient is no longer taking medication on a regular basis. Patient denies any numbness or tingling. States that still some mild weakness but increasing range of motion. Vision is happy with the results.    x-rays from 01/22/2014 showed no signs of dislocation with minimal degenerative changes of the acromioclavicular joint.  Past medical history, social, surgical and family history all reviewed in electronic medical record.   Review of Systems: No headache, visual changes, nausea, vomiting, diarrhea, constipation, dizziness, abdominal pain, skin rash, fevers, chills, night sweats, weight loss, swollen lymph nodes, body aches, joint swelling, muscle aches, chest pain, shortness of breath, mood changes.   Objective Blood pressure 146/72, pulse 65, height 5\' 8"  (1.727 m), weight 215 lb (97.523 kg), SpO2 95 %.  General: No apparent distress alert and oriented x3 mood and affect normal, dressed appropriately.  HEENT: Pupils equal, extraocular movements intact  Respiratory: Patient's speak in full sentences and does not appear short of breath  Cardiovascular: No lower extremity edema, non tender, no erythema  Skin: Warm dry intact with no signs of infection or rash on extremities or on axial skeleton.  Abdomen: Soft nontender  Neuro: Cranial nerves II through XII are intact, neurovascularly intact in all extremities with 2+ DTRs and 2+ pulses.  Lymph: No lymphadenopathy of posterior or anterior cervical chain or axillae bilaterally.  Gait normal with good balance and coordination.  MSK:   Non tender with full range of motion and good stability and symmetric strength and tone of  elbows, wrist, hip, knee and ankles bilaterally.  Shoulder: Left Inspection reveals no abnormalities, atrophy or asymmetry. Palpation is normal with no tenderness over AC joint or bicipital groove. Patient has increased range of motion with forward flexion of the 160, internal rotation to T12, external rotation of 10. This is significantly better than previous exam. Rotator cuff strength 4++ out of 5 compared to 5-5 on the contralateral side. Positive impingement Speeds and Yergason's tests normal. No labral pathology noted with negative Obrien's, negative clunk and good stability. Normal scapular function observed. No painful arc and no drop arm sign. No apprehension sign  Contralateral shoulder unremarkable.        Impression and Recommendations:     This case required medical decision making of moderate complexity.

## 2014-05-01 NOTE — Assessment & Plan Note (Signed)
Discussed with patient at this time. I do think that icing regimen and home exercises aren beneficial. Discussed to continue the strengthening exercises. If patient has any worsening symptoms or weakness we do need to advance imaging. Patient though is surprisingly healing considerably well with conservative therapy. We discussed if patient seems to plateau in the near future another injection may be necessary as well. We will discuss this at follow-up in 8 weeks. At that time would like to ultrasound likely for further evaluation.

## 2014-05-02 NOTE — Patient Instructions (Signed)
Your procedure is scheduled on: 05/08/2014  Report to [facility] at  36  AM.  Call this number if you have problems the morning of surgery: [phone]   Do not eat food or drink liquids :After Midnight.      Take these medicines the morning of surgery with A SIP OF WATER: zyrtec,, cardiazem, neurontin, metoprolol, ultram   Do not wear jewelry, make-up or nail polish.  Do not wear lotions, powders, or perfumes.   Do not shave 48 hours prior to surgery.  Do not bring valuables to the hospital.  Contacts, dentures or bridgework may not be worn into surgery.  Leave suitcase in the car. After surgery it may be brought to your room.  For patients admitted to the hospital, checkout time is 11:00 AM the day of discharge.   Patients discharged the day of surgery will not be allowed to drive home.  :     Please read over the following fact sheets that you were given: Coughing and Deep Breathing, Surgical Site Infection Prevention, Anesthesia Post-op Instructions and Care and Recovery After Surgery    Cataract A cataract is a clouding of the lens of the eye. When a lens becomes cloudy, vision is reduced based on the degree and nature of the clouding. Many cataracts reduce vision to some degree. Some cataracts make people more near-sighted as they develop. Other cataracts increase glare. Cataracts that are ignored and become worse can sometimes look white. The white color can be seen through the pupil. CAUSES   Aging. However, cataracts may occur at any age, even in newborns.   Certain drugs.   Trauma to the eye.   Certain diseases such as diabetes.   Specific eye diseases such as chronic inflammation inside the eye or a sudden attack of a rare form of glaucoma.   Inherited or acquired medical problems.  SYMPTOMS   Gradual, progressive drop in vision in the affected eye.   Severe, rapid visual loss. This most often happens when trauma is the cause.  DIAGNOSIS  To detect a cataract, an  eye doctor examines the lens. Cataracts are best diagnosed with an exam of the eyes with the pupils enlarged (dilated) by drops.  TREATMENT  For an early cataract, vision may improve by using different eyeglasses or stronger lighting. If that does not help your vision, surgery is the only effective treatment. A cataract needs to be surgically removed when vision loss interferes with your everyday activities, such as driving, reading, or watching TV. A cataract may also have to be removed if it prevents examination or treatment of another eye problem. Surgery removes the cloudy lens and usually replaces it with a substitute lens (intraocular lens, IOL).  At a time when both you and your doctor agree, the cataract will be surgically removed. If you have cataracts in both eyes, only one is usually removed at a time. This allows the operated eye to heal and be out of danger from any possible problems after surgery (such as infection or poor wound healing). In rare cases, a cataract may be doing damage to your eye. In these cases, your caregiver may advise surgical removal right away. The vast majority of people who have cataract surgery have better vision afterward. HOME CARE INSTRUCTIONS  If you are not planning surgery, you may be asked to do the following:  Use different eyeglasses.   Use stronger or brighter lighting.   Ask your eye doctor about reducing your medicine dose  or changing medicines if it is thought that a medicine caused your cataract. Changing medicines does not make the cataract go away on its own.   Become familiar with your surroundings. Poor vision can lead to injury. Avoid bumping into things on the affected side. You are at a higher risk for tripping or falling.   Exercise extreme care when driving or operating machinery.   Wear sunglasses if you are sensitive to bright light or experiencing problems with glare.  SEEK IMMEDIATE MEDICAL CARE IF:   You have a worsening or sudden  vision loss.   You notice redness, swelling, or increasing pain in the eye.   You have a fever.  Document Released: 02/10/2005 Document Revised: 01/30/2011 Document Reviewed: 10/04/2010 [facility] Patient Information 2012 [place].PATIENT INSTRUCTIONS POST-ANESTHESIA  IMMEDIATELY FOLLOWING SURGERY:  Do not drive or operate machinery for the first twenty four hours after surgery.  Do not make any important decisions for twenty four hours after surgery or while taking narcotic pain medications or sedatives.  If you develop intractable nausea and vomiting or a severe headache please notify your doctor immediately.  FOLLOW-UP:  Please make an appointment with your surgeon as instructed. You do not need to follow up with anesthesia unless specifically instructed to do so.  WOUND CARE INSTRUCTIONS (if applicable):  Keep a dry clean dressing on the anesthesia/puncture wound site if there is drainage.  Once the wound has quit draining you may leave it open to air.  Generally you should leave the bandage intact for twenty four hours unless there is drainage.  If the epidural site drains for more than 36-48 hours please call the anesthesia department.  QUESTIONS?:  Please feel free to call your physician or the hospital operator if you have any questions, and they will be happy to assist you.

## 2014-05-03 ENCOUNTER — Encounter (HOSPITAL_COMMUNITY)
Admission: RE | Admit: 2014-05-03 | Discharge: 2014-05-03 | Disposition: A | Discharge status: Home / Self Care | Payer: Medicare Other | Source: Ambulatory Visit | Attending: Ophthalmology | Admitting: Ophthalmology

## 2014-05-25 ENCOUNTER — Other Ambulatory Visit: Payer: Self-pay | Admitting: *Deleted

## 2014-05-25 MED ORDER — TRAMADOL HCL 50 MG PO TABS: 50.0000 mg | ORAL_TABLET | Freq: Two times a day (BID) | ORAL | Status: DC | PRN

## 2014-05-25 NOTE — Telephone Encounter (Signed)
Refill done.  

## 2014-05-29 NOTE — Patient Instructions (Signed)
Your procedure is scheduled on: 06/05/2014  Report to [facility] at  64  AM.  Call this number if you have problems the morning of surgery: [phone]   Do not eat food or drink liquids :After Midnight.      Take these medicines the morning of surgery with A SIP OF WATER: zyrtec, cardiazem, neurontin, metoprolol, ultram   Do not wear jewelry, make-up or nail polish.  Do not wear lotions, powders, or perfumes.   Do not shave 48 hours prior to surgery.  Do not bring valuables to the hospital.  Contacts, dentures or bridgework may not be worn into surgery.  Leave suitcase in the car. After surgery it may be brought to your room.  For patients admitted to the hospital, checkout time is 11:00 AM the day of discharge.   Patients discharged the day of surgery will not be allowed to drive home.  :     Please read over the following fact sheets that you were given: Coughing and Deep Breathing, Surgical Site Infection Prevention, Anesthesia Post-op Instructions and Care and Recovery After Surgery    Cataract A cataract is a clouding of the lens of the eye. When a lens becomes cloudy, vision is reduced based on the degree and nature of the clouding. Many cataracts reduce vision to some degree. Some cataracts make people more near-sighted as they develop. Other cataracts increase glare. Cataracts that are ignored and become worse can sometimes look white. The white color can be seen through the pupil. CAUSES   Aging. However, cataracts may occur at any age, even in newborns.   Certain drugs.   Trauma to the eye.   Certain diseases such as diabetes.   Specific eye diseases such as chronic inflammation inside the eye or a sudden attack of a rare form of glaucoma.   Inherited or acquired medical problems.  SYMPTOMS   Gradual, progressive drop in vision in the affected eye.   Severe, rapid visual loss. This most often happens when trauma is the cause.  DIAGNOSIS  To detect a cataract, an  eye doctor examines the lens. Cataracts are best diagnosed with an exam of the eyes with the pupils enlarged (dilated) by drops.  TREATMENT  For an early cataract, vision may improve by using different eyeglasses or stronger lighting. If that does not help your vision, surgery is the only effective treatment. A cataract needs to be surgically removed when vision loss interferes with your everyday activities, such as driving, reading, or watching TV. A cataract may also have to be removed if it prevents examination or treatment of another eye problem. Surgery removes the cloudy lens and usually replaces it with a substitute lens (intraocular lens, IOL).  At a time when both you and your doctor agree, the cataract will be surgically removed. If you have cataracts in both eyes, only one is usually removed at a time. This allows the operated eye to heal and be out of danger from any possible problems after surgery (such as infection or poor wound healing). In rare cases, a cataract may be doing damage to your eye. In these cases, your caregiver may advise surgical removal right away. The vast majority of people who have cataract surgery have better vision afterward. HOME CARE INSTRUCTIONS  If you are not planning surgery, you may be asked to do the following:  Use different eyeglasses.   Use stronger or brighter lighting.   Ask your eye doctor about reducing your medicine dose  or changing medicines if it is thought that a medicine caused your cataract. Changing medicines does not make the cataract go away on its own.   Become familiar with your surroundings. Poor vision can lead to injury. Avoid bumping into things on the affected side. You are at a higher risk for tripping or falling.   Exercise extreme care when driving or operating machinery.   Wear sunglasses if you are sensitive to bright light or experiencing problems with glare.  SEEK IMMEDIATE MEDICAL CARE IF:   You have a worsening or sudden  vision loss.   You notice redness, swelling, or increasing pain in the eye.   You have a fever.  Document Released: 02/10/2005 Document Revised: 01/30/2011 Document Reviewed: 10/04/2010 [facility] Patient Information 2012 [place].PATIENT INSTRUCTIONS POST-ANESTHESIA  IMMEDIATELY FOLLOWING SURGERY:  Do not drive or operate machinery for the first twenty four hours after surgery.  Do not make any important decisions for twenty four hours after surgery or while taking narcotic pain medications or sedatives.  If you develop intractable nausea and vomiting or a severe headache please notify your doctor immediately.  FOLLOW-UP:  Please make an appointment with your surgeon as instructed. You do not need to follow up with anesthesia unless specifically instructed to do so.  WOUND CARE INSTRUCTIONS (if applicable):  Keep a dry clean dressing on the anesthesia/puncture wound site if there is drainage.  Once the wound has quit draining you may leave it open to air.  Generally you should leave the bandage intact for twenty four hours unless there is drainage.  If the epidural site drains for more than 36-48 hours please call the anesthesia department.  QUESTIONS?:  Please feel free to call your physician or the hospital operator if you have any questions, and they will be happy to assist you.

## 2014-05-30 ENCOUNTER — Encounter (HOSPITAL_COMMUNITY): Payer: Self-pay

## 2014-05-30 ENCOUNTER — Other Ambulatory Visit: Payer: Self-pay

## 2014-05-30 ENCOUNTER — Encounter (HOSPITAL_COMMUNITY)
Admission: RE | Admit: 2014-05-30 | Discharge: 2014-05-30 | Disposition: A | Discharge status: Home / Self Care | Payer: Medicare Other | Source: Ambulatory Visit | Attending: Ophthalmology | Admitting: Ophthalmology

## 2014-05-30 DIAGNOSIS — Z01812 Encounter for preprocedural laboratory examination: Secondary | ICD-10-CM | POA: Diagnosis present

## 2014-05-30 DIAGNOSIS — Z01810 Encounter for preprocedural cardiovascular examination: Secondary | ICD-10-CM | POA: Diagnosis not present

## 2014-05-30 LAB — CBC
HCT: 39.4 % (ref 36.0–46.0)
HEMOGLOBIN: 13 g/dL (ref 12.0–15.0)
MCH: 31.4 pg (ref 26.0–34.0)
MCHC: 33 g/dL (ref 30.0–36.0)
MCV: 95.2 fL (ref 78.0–100.0)
Platelets: 189 10*3/uL (ref 150–400)
RBC: 4.14 MIL/uL (ref 3.87–5.11)
RDW: 12.9 % (ref 11.5–15.5)
WBC: 5.3 10*3/uL (ref 4.0–10.5)

## 2014-05-30 LAB — BASIC METABOLIC PANEL
Anion gap: 8 (ref 5–15)
BUN: 20 mg/dL (ref 6–23)
CHLORIDE: 107 mmol/L (ref 96–112)
CO2: 28 mmol/L (ref 19–32)
Calcium: 9.4 mg/dL (ref 8.4–10.5)
Creatinine, Ser: 0.68 mg/dL (ref 0.50–1.10)
GFR, EST NON AFRICAN AMERICAN: 88 mL/min — AB (ref >90)
Glucose, Bld: 105 mg/dL — ABNORMAL HIGH (ref 70–99)
POTASSIUM: 3.7 mmol/L (ref 3.5–5.1)
SODIUM: 143 mmol/L (ref 135–145)

## 2014-06-05 ENCOUNTER — Encounter (HOSPITAL_COMMUNITY): Payer: Self-pay | Admitting: *Deleted

## 2014-06-05 ENCOUNTER — Encounter (HOSPITAL_COMMUNITY)
Admission: RE | Disposition: A | Discharge status: Home / Self Care | Payer: Self-pay | Source: Ambulatory Visit | Attending: Ophthalmology

## 2014-06-05 ENCOUNTER — Ambulatory Visit (HOSPITAL_COMMUNITY): Payer: Medicare Other | Admitting: Anesthesiology

## 2014-06-05 ENCOUNTER — Ambulatory Visit (HOSPITAL_COMMUNITY)
Admission: RE | Admit: 2014-06-05 | Discharge: 2014-06-05 | Disposition: A | Discharge status: Home / Self Care | Payer: Medicare Other | Source: Ambulatory Visit | Attending: Ophthalmology | Admitting: Ophthalmology

## 2014-06-05 DIAGNOSIS — H25811 Combined forms of age-related cataract, right eye: Secondary | ICD-10-CM | POA: Insufficient documentation

## 2014-06-05 DIAGNOSIS — I10 Essential (primary) hypertension: Secondary | ICD-10-CM | POA: Insufficient documentation

## 2014-06-05 DIAGNOSIS — E119 Type 2 diabetes mellitus without complications: Secondary | ICD-10-CM | POA: Insufficient documentation

## 2014-06-05 HISTORY — PX: CATARACT EXTRACTION W/PHACO: SHX586

## 2014-06-05 LAB — GLUCOSE, CAPILLARY: Glucose-Capillary: 120 mg/dL — ABNORMAL HIGH (ref 70–99)

## 2014-06-05 SURGERY — PHACOEMULSIFICATION, CATARACT, WITH IOL INSERTION
Anesthesia: Monitor Anesthesia Care | Site: Eye | Laterality: Right

## 2014-06-05 MED ORDER — NA HYALUR & NA CHOND-NA HYALUR 0.55-0.5 ML IO KIT
PACK | INTRAOCULAR | Status: DC | PRN
  Administered 2014-06-05: 1 via OPHTHALMIC

## 2014-06-05 MED ORDER — EPINEPHRINE HCL 1 MG/ML IJ SOLN
INTRAMUSCULAR | Status: DC | PRN
  Administered 2014-06-05: 500 mL

## 2014-06-05 MED ORDER — LIDOCAINE HCL 3.5 % OP GEL
1.0000 "application " | Freq: Once | OPHTHALMIC | Status: AC
  Administered 2014-06-05: 1 via OPHTHALMIC

## 2014-06-05 MED ORDER — FENTANYL CITRATE 0.05 MG/ML IJ SOLN
INTRAMUSCULAR | Status: AC
  Filled 2014-06-05: qty 2

## 2014-06-05 MED ORDER — MIDAZOLAM HCL 2 MG/2ML IJ SOLN
1.0000 mg | INTRAMUSCULAR | Status: DC | PRN
  Administered 2014-06-05: 2 mg via INTRAVENOUS

## 2014-06-05 MED ORDER — LACTATED RINGERS IV SOLN
INTRAVENOUS | Status: DC | PRN
  Administered 2014-06-05: 07:00:00 via INTRAVENOUS

## 2014-06-05 MED ORDER — NEOMYCIN-POLYMYXIN-DEXAMETH 3.5-10000-0.1 OP SUSP
OPHTHALMIC | Status: DC | PRN
  Administered 2014-06-05: 1 [drp] via OPHTHALMIC

## 2014-06-05 MED ORDER — POVIDONE-IODINE 5 % OP SOLN
OPHTHALMIC | Status: DC | PRN
  Administered 2014-06-05: 1 via OPHTHALMIC

## 2014-06-05 MED ORDER — LIDOCAINE HCL (PF) 1 % IJ SOLN
INTRAMUSCULAR | Status: DC | PRN
  Administered 2014-06-05: .7 mL

## 2014-06-05 MED ORDER — EPINEPHRINE HCL 1 MG/ML IJ SOLN
INTRAMUSCULAR | Status: AC
  Filled 2014-06-05: qty 1

## 2014-06-05 MED ORDER — MIDAZOLAM HCL 2 MG/2ML IJ SOLN
INTRAMUSCULAR | Status: AC
  Filled 2014-06-05: qty 2

## 2014-06-05 MED ORDER — CYCLOPENTOLATE-PHENYLEPHRINE 0.2-1 % OP SOLN
1.0000 [drp] | OPHTHALMIC | Status: AC
  Administered 2014-06-05 (×3): 1 [drp] via OPHTHALMIC

## 2014-06-05 MED ORDER — BSS IO SOLN
INTRAOCULAR | Status: DC | PRN
  Administered 2014-06-05: 15 mL

## 2014-06-05 MED ORDER — LACTATED RINGERS IV SOLN
INTRAVENOUS | Status: DC
  Administered 2014-06-05: 08:00:00 via INTRAVENOUS

## 2014-06-05 MED ORDER — TETRACAINE HCL 0.5 % OP SOLN
1.0000 [drp] | OPHTHALMIC | Status: AC
  Administered 2014-06-05 (×3): 1 [drp] via OPHTHALMIC

## 2014-06-05 MED ORDER — PHENYLEPHRINE HCL 2.5 % OP SOLN
1.0000 [drp] | OPHTHALMIC | Status: AC
  Administered 2014-06-05 (×3): 1 [drp] via OPHTHALMIC

## 2014-06-05 MED ORDER — FENTANYL CITRATE 0.05 MG/ML IJ SOLN
25.0000 ug | INTRAMUSCULAR | Status: AC
  Administered 2014-06-05: 08:00:00 via INTRAVENOUS
  Administered 2014-06-05: 25 ug via INTRAVENOUS

## 2014-06-05 SURGICAL SUPPLY — 10 items
CLOTH BEACON ORANGE TIMEOUT ST (SAFETY) ×2 IMPLANT
EYE SHIELD UNIVERSAL CLEAR (GAUZE/BANDAGES/DRESSINGS) ×2 IMPLANT
GLOVE BIOGEL PI IND STRL 7.0 (GLOVE) IMPLANT
GLOVE BIOGEL PI INDICATOR 7.0 (GLOVE) ×2
GLOVE EXAM NITRILE MD LF STRL (GLOVE) ×2 IMPLANT
PAD ARMBOARD 7.5X6 YLW CONV (MISCELLANEOUS) ×2 IMPLANT
SIGHTPATH CAT PROC W REG LENS (Ophthalmic Related) ×3 IMPLANT
SYRINGE LUER LOK 1CC (MISCELLANEOUS) ×2 IMPLANT
TAPE TRANSPORE STRL 2 31045 (GAUZE/BANDAGES/DRESSINGS) ×2 IMPLANT
WATER STERILE IRR 250ML POUR (IV SOLUTION) ×2 IMPLANT

## 2014-06-05 NOTE — Transfer of Care (Signed)
Immediate Anesthesia Transfer of Care Note  Patient: Sabrina Taylor  Procedure(s) Performed: Procedure(s) with comments: CATARACT EXTRACTION PHACO AND INTRAOCULAR LENS PLACEMENT (IOC) (Right) - CDE: 6.42  Patient Location: Short Stay  Anesthesia Type:MAC  Level of Consciousness: awake, alert  and oriented  Airway & Oxygen Therapy: Patient Spontanous Breathing  Post-op Assessment: Report given to RN and Post -op Vital signs reviewed and stable  Post vital signs: Reviewed and stable  Last Vitals:  Filed Vitals:   06/05/14 0711  BP: 157/81  Pulse: 57  Temp: 82.5 C    Complications: No apparent anesthesia complications

## 2014-06-05 NOTE — H&P (Signed)
I have reviewed the H&P, the patient was re-examined, and I have identified no interval changes in medical condition and plan of care since the history and physical of record  

## 2014-06-05 NOTE — Discharge Instructions (Signed)

## 2014-06-05 NOTE — Anesthesia Preprocedure Evaluation (Signed)
Anesthesia Evaluation  Patient identified by MRN, date of birth, ID band Patient awake    Reviewed: Allergy & Precautions, NPO status , Patient's Chart, lab work & pertinent test results, reviewed documented beta blocker date and time   History of Anesthesia Complications (+) PONV and history of anesthetic complications  Airway Mallampati: II  TM Distance: >3 FB     Dental  (+) Teeth Intact   Pulmonary neg pulmonary ROS,  breath sounds clear to auscultation        Cardiovascular hypertension, Pt. on medications and Pt. on home beta blockers + dysrhythmias Rhythm:Regular Rate:Normal     Neuro/Psych    GI/Hepatic negative GI ROS,   Endo/Other  diabetes, Well Controlled, Type 2, Oral Hypoglycemic Agents  Renal/GU      Musculoskeletal   Abdominal   Peds  Hematology   Anesthesia Other Findings   Reproductive/Obstetrics                             Anesthesia Physical Anesthesia Plan  ASA: III  Anesthesia Plan: MAC   Post-op Pain Management:    Induction: Intravenous  Airway Management Planned: Nasal Cannula  Additional Equipment:   Intra-op Plan:   Post-operative Plan:   Informed Consent: I have reviewed the patients History and Physical, chart, labs and discussed the procedure including the risks, benefits and alternatives for the proposed anesthesia with the patient or authorized representative who has indicated his/her understanding and acceptance.     Plan Discussed with:   Anesthesia Plan Comments:         Anesthesia Quick Evaluation

## 2014-06-05 NOTE — Anesthesia Postprocedure Evaluation (Signed)
  Anesthesia Post-op Note  Patient: Sabrina Taylor  Procedure(s) Performed: Procedure(s) with comments: CATARACT EXTRACTION PHACO AND INTRAOCULAR LENS PLACEMENT (IOC) (Right) - CDE: 6.42  Patient Location: Short Stay  Anesthesia Type:MAC  Level of Consciousness: awake, alert , oriented and patient cooperative  Airway and Oxygen Therapy: Patient Spontanous Breathing  Post-op Pain: none  Post-op Assessment: Post-op Vital signs reviewed, Patient's Cardiovascular Status Stable, Respiratory Function Stable, Patent Airway, No signs of Nausea or vomiting and Pain level controlled  Post-op Vital Signs: Reviewed and stable  Last Vitals:  Filed Vitals:   06/05/14 0711  BP: 157/81  Pulse: 57  Temp: 87.6 C    Complications: No apparent anesthesia complications

## 2014-06-05 NOTE — Anesthesia Procedure Notes (Signed)
Procedure Name: MAC Date/Time: 06/05/2014 7:52 AM Performed by: Andree Elk, Parthena Fergeson A Pre-anesthesia Checklist: Patient identified, Timeout performed, Emergency Drugs available, Suction available and Patient being monitored Oxygen Delivery Method: Nasal cannula

## 2014-06-05 NOTE — Op Note (Signed)
Date of Admission: 06/05/2014  Date of Surgery: 06/05/2014   Pre-Op Dx: Cataract Right Eye  Post-Op Dx: Senile Combined Cataract Right  Eye,  Dx Code N62.952  Surgeon: Tonny Branch, M.D.  Assistants: None  Anesthesia: Topical with MAC  Indications: Painless, progressive loss of vision with compromise of daily activities.  Surgery: Cataract Extraction with Intraocular lens Implant Right Eye  Discription: The patient had dilating drops and viscous lidocaine placed into the Right eye in the pre-op holding area. After transfer to the operating room, a time out was performed. The patient was then prepped and draped. Beginning with a 25 degree blade a paracentesis port was made at the surgeon's 2 o'clock position. The anterior chamber was then filled with 1% non-preserved lidocaine. This was followed by filling the anterior chamber with Provisc.  A 2.58mm keratome blade was used to make a clear corneal incision at the temporal limbus.  A bent cystatome needle was used to create a continuous tear capsulotomy. Hydrodissection was performed with balanced salt solution on a Fine canula. The lens nucleus was then removed using the phacoemulsification handpiece. Residual cortex was removed with the I&A handpiece. The anterior chamber and capsular bag were refilled with Provisc. A posterior chamber intraocular lens was placed into the capsular bag with it's injector. The implant was positioned with the Kuglan hook. The Provisc was then removed from the anterior chamber and capsular bag with the I&A handpiece. Stromal hydration of the main incision and paracentesis port was performed with BSS on a Fine canula. The wounds were tested for leak which was negative. The patient tolerated the procedure well. There were no operative complications. The patient was then transferred to the recovery room in stable condition.  Complications: None  Specimen: None  EBL: None  Prosthetic device: Hoya iSert 250, power 19.5  D, SN [id number].

## 2014-06-06 ENCOUNTER — Encounter (HOSPITAL_COMMUNITY): Payer: Self-pay | Admitting: Ophthalmology

## 2014-06-14 ENCOUNTER — Encounter (HOSPITAL_COMMUNITY)
Admission: RE | Admit: 2014-06-14 | Discharge: 2014-06-14 | Disposition: A | Discharge status: Home / Self Care | Payer: Medicare Other | Source: Ambulatory Visit | Attending: Ophthalmology | Admitting: Ophthalmology

## 2014-06-16 MED ORDER — PHENYLEPHRINE HCL 2.5 % OP SOLN
OPHTHALMIC | Status: AC
  Filled 2014-06-16: qty 15

## 2014-06-16 MED ORDER — LIDOCAINE HCL 3.5 % OP GEL
OPHTHALMIC | Status: AC
  Filled 2014-06-16: qty 1

## 2014-06-16 MED ORDER — LIDOCAINE HCL (PF) 1 % IJ SOLN
INTRAMUSCULAR | Status: AC
  Filled 2014-06-16: qty 2

## 2014-06-16 MED ORDER — CYCLOPENTOLATE-PHENYLEPHRINE OP SOLN OPTIME - NO CHARGE
OPHTHALMIC | Status: AC
  Filled 2014-06-16: qty 2

## 2014-06-16 MED ORDER — NEOMYCIN-POLYMYXIN-DEXAMETH 3.5-10000-0.1 OP SUSP
OPHTHALMIC | Status: AC
  Filled 2014-06-16: qty 5

## 2014-06-16 MED ORDER — TETRACAINE HCL 0.5 % OP SOLN
OPHTHALMIC | Status: AC
  Filled 2014-06-16: qty 2

## 2014-06-16 MED ORDER — KETOROLAC TROMETHAMINE 0.5 % OP SOLN: 1.0000 [drp] | OPHTHALMIC | Status: AC

## 2014-06-19 ENCOUNTER — Ambulatory Visit (HOSPITAL_COMMUNITY)
Admission: RE | Admit: 2014-06-19 | Discharge: 2014-06-19 | Disposition: A | Discharge status: Home / Self Care | Payer: Medicare Other | Source: Ambulatory Visit | Attending: Ophthalmology | Admitting: Ophthalmology

## 2014-06-19 ENCOUNTER — Ambulatory Visit (HOSPITAL_COMMUNITY): Payer: Medicare Other | Admitting: Anesthesiology

## 2014-06-19 ENCOUNTER — Encounter (HOSPITAL_COMMUNITY)
Admission: RE | Disposition: A | Discharge status: Home / Self Care | Payer: Self-pay | Source: Ambulatory Visit | Attending: Ophthalmology

## 2014-06-19 ENCOUNTER — Encounter (HOSPITAL_COMMUNITY): Payer: Self-pay | Admitting: *Deleted

## 2014-06-19 DIAGNOSIS — H25812 Combined forms of age-related cataract, left eye: Secondary | ICD-10-CM | POA: Insufficient documentation

## 2014-06-19 HISTORY — PX: CATARACT EXTRACTION W/PHACO: SHX586

## 2014-06-19 LAB — GLUCOSE, CAPILLARY: Glucose-Capillary: 114 mg/dL — ABNORMAL HIGH (ref 70–99)

## 2014-06-19 SURGERY — PHACOEMULSIFICATION, CATARACT, WITH IOL INSERTION
Anesthesia: Monitor Anesthesia Care | Site: Eye | Laterality: Left

## 2014-06-19 MED ORDER — PROVISC 10 MG/ML IO SOLN
INTRAOCULAR | Status: DC | PRN
  Administered 2014-06-19: 0.85 mL via INTRAOCULAR

## 2014-06-19 MED ORDER — POVIDONE-IODINE 5 % OP SOLN
OPHTHALMIC | Status: DC | PRN
  Administered 2014-06-19: 1 via OPHTHALMIC

## 2014-06-19 MED ORDER — PHENYLEPHRINE HCL 2.5 % OP SOLN
1.0000 [drp] | OPHTHALMIC | Status: AC
  Administered 2014-06-19 (×3): 1 [drp] via OPHTHALMIC

## 2014-06-19 MED ORDER — FENTANYL CITRATE (PF) 100 MCG/2ML IJ SOLN
INTRAMUSCULAR | Status: AC
Start: 2014-06-19 — End: 2014-06-19
  Filled 2014-06-19: qty 2

## 2014-06-19 MED ORDER — EPINEPHRINE HCL 1 MG/ML IJ SOLN
INTRAOCULAR | Status: DC | PRN
  Administered 2014-06-19: 500 mL

## 2014-06-19 MED ORDER — EPINEPHRINE HCL 1 MG/ML IJ SOLN
INTRAMUSCULAR | Status: AC
  Filled 2014-06-19: qty 1

## 2014-06-19 MED ORDER — CYCLOPENTOLATE-PHENYLEPHRINE 0.2-1 % OP SOLN
1.0000 [drp] | OPHTHALMIC | Status: AC
  Administered 2014-06-19 (×3): 1 [drp] via OPHTHALMIC

## 2014-06-19 MED ORDER — TETRACAINE HCL 0.5 % OP SOLN
1.0000 [drp] | OPHTHALMIC | Status: AC
Start: 2014-06-19 — End: 2014-06-19
  Administered 2014-06-19 (×3): 1 [drp] via OPHTHALMIC

## 2014-06-19 MED ORDER — LACTATED RINGERS IV SOLN
INTRAVENOUS | Status: DC
  Administered 2014-06-19: 1000 mL via INTRAVENOUS

## 2014-06-19 MED ORDER — FENTANYL CITRATE (PF) 100 MCG/2ML IJ SOLN
25.0000 ug | INTRAMUSCULAR | Status: AC
  Administered 2014-06-19 (×2): 25 ug via INTRAVENOUS

## 2014-06-19 MED ORDER — LIDOCAINE HCL 3.5 % OP GEL
1.0000 "application " | Freq: Once | OPHTHALMIC | Status: AC
  Administered 2014-06-19: 1 via OPHTHALMIC

## 2014-06-19 MED ORDER — BSS IO SOLN
INTRAOCULAR | Status: DC | PRN
  Administered 2014-06-19: 15 mL

## 2014-06-19 MED ORDER — MIDAZOLAM HCL 2 MG/2ML IJ SOLN
INTRAMUSCULAR | Status: AC
  Filled 2014-06-19: qty 2

## 2014-06-19 MED ORDER — MIDAZOLAM HCL 2 MG/2ML IJ SOLN
1.0000 mg | INTRAMUSCULAR | Status: DC | PRN
  Administered 2014-06-19: 2 mg via INTRAVENOUS

## 2014-06-19 MED ORDER — NEOMYCIN-POLYMYXIN-DEXAMETH 3.5-10000-0.1 OP SUSP
OPHTHALMIC | Status: DC | PRN
  Administered 2014-06-19: 2 [drp] via OPHTHALMIC

## 2014-06-19 MED ORDER — LIDOCAINE HCL (PF) 1 % IJ SOLN
INTRAMUSCULAR | Status: DC | PRN
  Administered 2014-06-19: .5 mL

## 2014-06-19 SURGICAL SUPPLY — 34 items
CAPSULAR TENSION RING-AMO (OPHTHALMIC RELATED) IMPLANT
CLOTH BEACON ORANGE TIMEOUT ST (SAFETY) ×2 IMPLANT
EYE SHIELD UNIVERSAL CLEAR (GAUZE/BANDAGES/DRESSINGS) ×2 IMPLANT
GLOVE BIO SURGEON STRL SZ 6.5 (GLOVE) IMPLANT
GLOVE BIO SURGEONS STRL SZ 6.5 (GLOVE)
GLOVE BIOGEL PI IND STRL 6.5 (GLOVE) IMPLANT
GLOVE BIOGEL PI IND STRL 7.0 (GLOVE) IMPLANT
GLOVE BIOGEL PI IND STRL 7.5 (GLOVE) IMPLANT
GLOVE BIOGEL PI INDICATOR 6.5 (GLOVE) ×2
GLOVE BIOGEL PI INDICATOR 7.0 (GLOVE)
GLOVE BIOGEL PI INDICATOR 7.5 (GLOVE)
GLOVE ECLIPSE 6.5 STRL STRAW (GLOVE) IMPLANT
GLOVE ECLIPSE 7.0 STRL STRAW (GLOVE) IMPLANT
GLOVE ECLIPSE 7.5 STRL STRAW (GLOVE) IMPLANT
GLOVE EXAM NITRILE LRG STRL (GLOVE) IMPLANT
GLOVE EXAM NITRILE MD LF STRL (GLOVE) ×2 IMPLANT
GLOVE SKINSENSE NS SZ6.5 (GLOVE)
GLOVE SKINSENSE NS SZ7.0 (GLOVE)
GLOVE SKINSENSE STRL SZ6.5 (GLOVE) IMPLANT
GLOVE SKINSENSE STRL SZ7.0 (GLOVE) IMPLANT
KIT VITRECTOMY (OPHTHALMIC RELATED) IMPLANT
PAD ARMBOARD 7.5X6 YLW CONV (MISCELLANEOUS) ×2 IMPLANT
PROC W NO LENS (INTRAOCULAR LENS)
PROC W SPEC LENS (INTRAOCULAR LENS)
PROCESS W NO LENS (INTRAOCULAR LENS) IMPLANT
PROCESS W SPEC LENS (INTRAOCULAR LENS) IMPLANT
RETRACTOR IRIS SIGHTPATH (OPHTHALMIC RELATED) IMPLANT
RING MALYGIN (MISCELLANEOUS) IMPLANT
SIGHTPATH CAT PROC W REG LENS (Ophthalmic Related) ×3 IMPLANT
SYRINGE LUER LOK 1CC (MISCELLANEOUS) ×2 IMPLANT
TAPE SURG TRANSPORE 1 IN (GAUZE/BANDAGES/DRESSINGS) IMPLANT
TAPE SURGICAL TRANSPORE 1 IN (GAUZE/BANDAGES/DRESSINGS) ×2
VISCOELASTIC ADDITIONAL (OPHTHALMIC RELATED) IMPLANT
WATER STERILE IRR 250ML POUR (IV SOLUTION) ×2 IMPLANT

## 2014-06-19 NOTE — H&P (Signed)
I have reviewed the H&P, the patient was re-examined, and I have identified no interval changes in medical condition and plan of care since the history and physical of record  

## 2014-06-19 NOTE — Anesthesia Postprocedure Evaluation (Signed)
  Anesthesia Post-op Note  Patient: Sabrina Taylor  Procedure(s) Performed: Procedure(s) (LRB): CATARACT EXTRACTION PHACO AND INTRAOCULAR LENS PLACEMENT (IOC) (Left)  Patient Location:  Short Stay  Anesthesia Type: MAC  Level of Consciousness: awake  Airway and Oxygen Therapy: Patient Spontanous Breathing  Post-op Pain: none  Post-op Assessment: Post-op Vital signs reviewed, Patient's Cardiovascular Status Stable, Respiratory Function Stable, Patent Airway, No signs of Nausea or vomiting and Pain level controlled  Post-op Vital Signs: Reviewed and stable  Complications: No apparent anesthesia complications

## 2014-06-19 NOTE — Discharge Instructions (Signed)

## 2014-06-19 NOTE — Anesthesia Preprocedure Evaluation (Signed)
Anesthesia Evaluation  Patient identified by MRN, date of birth, ID band Patient awake    Reviewed: Allergy & Precautions, NPO status , Patient's Chart, lab work & pertinent test results, reviewed documented beta blocker date and time   History of Anesthesia Complications (+) PONV and history of anesthetic complications  Airway Mallampati: II  TM Distance: >3 FB     Dental  (+) Teeth Intact   Pulmonary neg pulmonary ROS,  breath sounds clear to auscultation        Cardiovascular hypertension, Pt. on medications and Pt. on home beta blockers + dysrhythmias Rhythm:Regular Rate:Normal     Neuro/Psych    GI/Hepatic negative GI ROS,   Endo/Other  diabetes, Well Controlled, Type 2, Oral Hypoglycemic Agents  Renal/GU      Musculoskeletal   Abdominal   Peds  Hematology   Anesthesia Other Findings   Reproductive/Obstetrics                             Anesthesia Physical Anesthesia Plan  ASA: III  Anesthesia Plan: MAC   Post-op Pain Management:    Induction: Intravenous  Airway Management Planned: Nasal Cannula  Additional Equipment:   Intra-op Plan:   Post-operative Plan:   Informed Consent: I have reviewed the patients History and Physical, chart, labs and discussed the procedure including the risks, benefits and alternatives for the proposed anesthesia with the patient or authorized representative who has indicated his/her understanding and acceptance.     Plan Discussed with:   Anesthesia Plan Comments:         Anesthesia Quick Evaluation

## 2014-06-19 NOTE — Addendum Note (Signed)
Addendum  created 06/19/14 0916 by Vista Deck, CRNA   Modules edited: Anesthesia Flowsheet

## 2014-06-19 NOTE — Op Note (Signed)
Date of Admission: 06/19/2014  Date of Surgery: 06/19/2014   Pre-Op Dx: Cataract Left Eye  Post-Op Dx: Senile Combined Cataract Left  Eye,  Dx Code T61.443  Surgeon: Tonny Branch, M.D.  Assistants: None  Anesthesia: Topical with MAC  Indications: Painless, progressive loss of vision with compromise of daily activities.  Surgery: Cataract Extraction with Intraocular lens Implant Left Eye  Discription: The patient had dilating drops and viscous lidocaine placed into the Left eye in the pre-op holding area. After transfer to the operating room, a time out was performed. The patient was then prepped and draped. Beginning with a 72 degree blade a paracentesis port was made at the surgeon's 2 o'clock position. The anterior chamber was then filled with 1% non-preserved lidocaine. This was followed by filling the anterior chamber with Provisc.  A 2.51mm keratome blade was used to make a clear corneal incision at the temporal limbus.  A bent cystatome needle was used to create a continuous tear capsulotomy. Hydrodissection was performed with balanced salt solution on a Fine canula. The lens nucleus was then removed using the phacoemulsification handpiece. Residual cortex was removed with the I&A handpiece. The anterior chamber and capsular bag were refilled with Provisc. A posterior chamber intraocular lens was placed into the capsular bag with it's injector. The implant was positioned with the Kuglan hook. The Provisc was then removed from the anterior chamber and capsular bag with the I&A handpiece. Stromal hydration of the main incision and paracentesis port was performed with BSS on a Fine canula. The wounds were tested for leak which was negative. The patient tolerated the procedure well. There were no operative complications. The patient was then transferred to the recovery room in stable condition.  Complications: None  Specimen: None  EBL: None  Prosthetic device: Hoya iSert 250, power 19.5 D, SN  [id number].

## 2014-06-19 NOTE — Anesthesia Procedure Notes (Signed)
Procedure Name: MAC Date/Time: 06/19/2014 8:39 AM Performed by: Vista Deck Pre-anesthesia Checklist: Patient identified, Emergency Drugs available, Suction available, Timeout performed and Patient being monitored Patient Re-evaluated:Patient Re-evaluated prior to inductionOxygen Delivery Method: Nasal Cannula

## 2014-06-19 NOTE — Transfer of Care (Signed)
Immediate Anesthesia Transfer of Care Note  Patient: Sabrina Taylor  Procedure(s) Performed: Procedure(s) (LRB): CATARACT EXTRACTION PHACO AND INTRAOCULAR LENS PLACEMENT (IOC) (Left)  Patient Location: Shortstay  Anesthesia Type: MAC  Level of Consciousness: awake  Airway & Oxygen Therapy: Patient Spontanous Breathing   Post-op Assessment: Report given to PACU RN, Post -op Vital signs reviewed and stable and Patient moving all extremities  Post vital signs: Reviewed and stable  Complications: No apparent anesthesia complications

## 2014-06-20 ENCOUNTER — Encounter (HOSPITAL_COMMUNITY): Payer: Self-pay | Admitting: Ophthalmology

## 2014-06-26 ENCOUNTER — Ambulatory Visit: Payer: Medicare Other | Admitting: Family Medicine

## 2014-07-12 ENCOUNTER — Encounter: Payer: Self-pay | Admitting: Family Medicine

## 2014-07-12 ENCOUNTER — Other Ambulatory Visit (INDEPENDENT_AMBULATORY_CARE_PROVIDER_SITE_OTHER): Payer: Medicare Other

## 2014-07-12 ENCOUNTER — Ambulatory Visit (INDEPENDENT_AMBULATORY_CARE_PROVIDER_SITE_OTHER): Payer: Medicare Other | Admitting: Family Medicine

## 2014-07-12 VITALS — BP 144/88 | HR 72 | Ht 68.0 in | Wt 214.0 lb

## 2014-07-12 DIAGNOSIS — M25512 Pain in left shoulder: Secondary | ICD-10-CM

## 2014-07-12 DIAGNOSIS — S46002D Unspecified injury of muscle(s) and tendon(s) of the rotator cuff of left shoulder, subsequent encounter: Secondary | ICD-10-CM

## 2014-07-12 NOTE — Patient Instructions (Addendum)
It is great to see you Sabrina Taylor is your friend.  You have done great!4 Continue the vitamins See me again when you need me or if anything worsens.

## 2014-07-12 NOTE — Progress Notes (Signed)
  [name] [place],  [address] Phone: [phone] Subjective:     CC: Left arm pain .  [id number] VIENO TARRANT is a 69 y.o. female coming in with complaint of left arm pain. Patient was found to have a rotator cuff tear. Patient has tried some conservative therapy at this point. Patient states she continues to improve and states that she is 90% better. States that she can do all activities of daily living and is happy with the results.    x-rays from 01/22/2014 showed no signs of dislocation with minimal degenerative changes of the acromioclavicular joint.  Past medical history, social, surgical and family history all reviewed in electronic medical record.   Review of Systems: No headache, visual changes, nausea, vomiting, diarrhea, constipation, dizziness, abdominal pain, skin rash, fevers, chills, night sweats, weight loss, swollen lymph nodes, body aches, joint swelling, muscle aches, chest pain, shortness of breath, mood changes.   Objective Blood pressure 144/88, pulse 72, height 5\' 8"  (1.727 m), weight 214 lb (97.07 kg), SpO2 98 %.  General: No apparent distress alert and oriented x3 mood and affect normal, dressed appropriately.  HEENT: Pupils equal, extraocular movements intact  Respiratory: Patient's speak in full sentences and does not appear short of breath  Cardiovascular: No lower extremity edema, non tender, no erythema  Skin: Warm dry intact with no signs of infection or rash on extremities or on axial skeleton.  Abdomen: Soft nontender  Neuro: Cranial nerves II through XII are intact, neurovascularly intact in all extremities with 2+ DTRs and 2+ pulses.  Lymph: No lymphadenopathy of posterior or anterior cervical chain or axillae bilaterally.  Gait normal with good balance and coordination.  MSK:  Non tender with full range of motion and good stability and symmetric strength and tone of  elbows, wrist, hip, knee and  ankles bilaterally.  Shoulder: Left Inspection reveals no abnormalities, atrophy or asymmetry. Palpation is normal with no tenderness over AC joint or bicipital groove. Patient has increased range of motion with forward flexion of the 170, internal rotation to T12, external rotation of 10. This is significantly better than previous exam. Rotator cuff strength 4++ out of 5 compared to 5-5 on the contralateral side. Positive impingement Speeds and Yergason's tests normal. No labral pathology noted with negative Obrien's, negative clunk and good stability. Normal scapular function observed. No painful arc and no drop arm sign. No apprehension sign  MSK US performed of: Left shoulder This study was ordered, performed, and interpreted by Charlann Boxer D.O.  Shoulder:   Supraspinatus:  Degenerative changes noted. Patient still has a retraction noted on the articular side but not full-thickness anymore. 0.9 cm of retraction noted. Infraspinatus:  Appears normal on long and transverse views. Subscapularis:  Appears normal on long and transverse views. Teres Minor:  Appears normal on long and transverse views. AC joint:  Capsule undistended, no geyser sign. Glenohumeral Joint:  Mild to moderate arthritis. Glenoid Labrum:  Intact without visualized tears. Biceps Tendon:  Degenerative changes noted but no true tear No increased power doppler signal. Impression: Rotator cuff tear noted the patient is improving symptomatically          Impression and Recommendations:     This case required medical decision making of moderate complexity.

## 2014-07-12 NOTE — Progress Notes (Signed)
Pre visit review using our clinic review tool, if applicable. No additional management support is needed unless otherwise documented below in the visit note. 

## 2014-07-12 NOTE — Assessment & Plan Note (Signed)
Asst. with patient at great length. We discussed the possibility of a repeat injection which patient declined. We discussed the possibility of advanced imaging with patient showing some retraction which patient declined. We discussed also with patient that if any worsening symptoms though patient does have to come back and be seen almost immediately so we do have the choice of surgical intervention. Patient wants to avoid that and feels that she can do her daily activities and at this point then we will just follow up on an as-needed basis.

## 2015-03-14 DIAGNOSIS — Z6832 Body mass index (BMI) 32.0-32.9, adult: Secondary | ICD-10-CM | POA: Diagnosis not present

## 2015-03-14 DIAGNOSIS — I10 Essential (primary) hypertension: Secondary | ICD-10-CM | POA: Diagnosis not present

## 2015-03-14 DIAGNOSIS — Z1389 Encounter for screening for other disorder: Secondary | ICD-10-CM | POA: Diagnosis not present

## 2015-03-14 DIAGNOSIS — E6609 Other obesity due to excess calories: Secondary | ICD-10-CM | POA: Diagnosis not present

## 2015-06-12 DIAGNOSIS — H43812 Vitreous degeneration, left eye: Secondary | ICD-10-CM | POA: Diagnosis not present

## 2016-01-02 DIAGNOSIS — Z1389 Encounter for screening for other disorder: Secondary | ICD-10-CM | POA: Diagnosis not present

## 2016-01-02 DIAGNOSIS — Z0001 Encounter for general adult medical examination with abnormal findings: Secondary | ICD-10-CM | POA: Diagnosis not present

## 2016-01-02 DIAGNOSIS — E6609 Other obesity due to excess calories: Secondary | ICD-10-CM | POA: Diagnosis not present

## 2016-01-02 DIAGNOSIS — R202 Paresthesia of skin: Secondary | ICD-10-CM | POA: Diagnosis not present

## 2016-01-02 DIAGNOSIS — Z6833 Body mass index (BMI) 33.0-33.9, adult: Secondary | ICD-10-CM | POA: Diagnosis not present

## 2016-01-02 DIAGNOSIS — G629 Polyneuropathy, unspecified: Secondary | ICD-10-CM | POA: Diagnosis not present

## 2016-01-09 DIAGNOSIS — I10 Essential (primary) hypertension: Secondary | ICD-10-CM | POA: Diagnosis not present

## 2016-01-09 DIAGNOSIS — E119 Type 2 diabetes mellitus without complications: Secondary | ICD-10-CM | POA: Diagnosis not present

## 2016-01-09 DIAGNOSIS — E782 Mixed hyperlipidemia: Secondary | ICD-10-CM | POA: Diagnosis not present

## 2016-01-09 DIAGNOSIS — E669 Obesity, unspecified: Secondary | ICD-10-CM | POA: Diagnosis not present

## 2016-01-09 DIAGNOSIS — Z0001 Encounter for general adult medical examination with abnormal findings: Secondary | ICD-10-CM | POA: Diagnosis not present

## 2016-01-09 DIAGNOSIS — J301 Allergic rhinitis due to pollen: Secondary | ICD-10-CM | POA: Diagnosis not present

## 2016-01-25 DIAGNOSIS — E669 Obesity, unspecified: Secondary | ICD-10-CM | POA: Diagnosis not present

## 2016-01-25 DIAGNOSIS — Z6832 Body mass index (BMI) 32.0-32.9, adult: Secondary | ICD-10-CM | POA: Diagnosis not present

## 2016-01-25 DIAGNOSIS — E782 Mixed hyperlipidemia: Secondary | ICD-10-CM | POA: Diagnosis not present

## 2016-01-25 DIAGNOSIS — M5410 Radiculopathy, site unspecified: Secondary | ICD-10-CM | POA: Diagnosis not present

## 2016-01-25 DIAGNOSIS — M545 Low back pain: Secondary | ICD-10-CM | POA: Diagnosis not present

## 2016-01-25 DIAGNOSIS — E119 Type 2 diabetes mellitus without complications: Secondary | ICD-10-CM | POA: Diagnosis not present

## 2016-02-16 NOTE — Progress Notes (Signed)
[name] [place], [place] [address] Phone: [phone] Subjective:    I'm seeing this patient by the request  of:  KOBERLEIN, Kassie Mends, MD   CC:  Left hip pain  [id number]  Sabrina Taylor is a 70 y.o. female coming in with complaint of left hip pain. Past medical history is significant for low back pain as well as piriformis syndrome previously. Patient states Was significantly worse 4 weeks ago. Standing a little bit better. Has been doing more heat and avoiding certain activities. Pain symmetry more on the lateral aspect of the hip. Mild radiation towards the knee. States that it seems different than her back pain that she had previously. Denies any weakness or numbness. States that now the pain is only at night. Even this seems to begin better.   past imaging includes an MRI of the lumbar spine in August 2014. This was independently visualized by me. Patient does have postoperative changes on the left L4-L5 and mild disc changing at L5-S1.  Past Medical History:  Diagnosis Date  . DDD (degenerative disc disease)    Low back pain  . First degree AV block   . Hyperlipidemia   . Hypertension   . LBBB (left bundle branch block)   . Obesity   . PAF (paroxysmal atrial fibrillation) ([place])   . PONV (postoperative nausea and vomiting)    Past Surgical History:  Procedure Laterality Date  . APPENDECTOMY  1960  . BACK SURGERY  may 2011  . CATARACT EXTRACTION W/PHACO Right 06/05/2014   Procedure: CATARACT EXTRACTION PHACO AND INTRAOCULAR LENS PLACEMENT (IOC);  Surgeon: Tonny Branch, MD;  Location: [facility];  Service: Ophthalmology;  Laterality: Right;  CDE: 6.42  . CATARACT EXTRACTION W/PHACO Left 06/19/2014   Procedure: CATARACT EXTRACTION PHACO AND INTRAOCULAR LENS PLACEMENT (IOC);  Surgeon: Tonny Branch, MD;  Location: [facility];  Service: Ophthalmology;  Laterality: Left;  CDE 5.61  . CHOLECYSTECTOMY    . COLONOSCOPY  03/25/2011   Procedure:  COLONOSCOPY;  Surgeon: Jamesetta So, MD;  Location: [facility];  Service: Gastroenterology;  Laterality: N/A;  . KNEE ARTHROSCOPY  2004, 2008   Right  . TOTAL ABDOMINAL HYSTERECTOMY     Social History   Social History  . Marital status: Married    Spouse name: N/A  . Number of children: N/A  . Years of education: N/A   Occupational History  . 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 Smoking status: Never Smoker  . Smokeless tobacco: None  . Alcohol use No  . Drug use: No  . Sexual activity: Not Asked   Other Topics Concern  . None   Social History Narrative   Married with one adult child, lives in [place]   No regular exercise   Allergies  Allergen Reactions  . Penicillins Hives   Family History  Problem Relation Age of Onset  . Hypertension Mother   . Alzheimer's disease Mother   . Stroke Father   . Coronary artery disease Brother     Past medical history, social, surgical and family history all reviewed in electronic medical record.  No pertanent information unless stated regarding to the chief complaint.   Review of Systems:Review of systems updated and as accurate as of 02/19/16  No headache, visual changes, nausea, vomiting, diarrhea, constipation, dizziness, abdominal pain, skin rash, fevers, chills, night sweats, weight loss, swollen lymph nodes, body aches, joint swelling,  muscle aches, chest pain, shortness of breath, mood changes.   Objective  Blood pressure 138/74, pulse (!) 56, height 5\' 8"  (1.727 m), weight 221 lb (100.2 kg), SpO2 95 %. Systems examined below as of 02/19/16   General: No apparent distress alert and oriented x3 mood and affect normal, dressed appropriately.  HEENT: Pupils equal, extraocular movements intact  Respiratory: Patient's speak in full sentences and does not appear short of breath  Cardiovascular: No lower extremity edema, non tender, no erythema  Skin: Warm dry intact with no  signs of infection or rash on extremities or on axial skeleton.  Abdomen: Soft nontender  Neuro: Cranial nerves II through XII are intact, neurovascularly intact in all extremities with 2+ DTRs and 2+ pulses.  Lymph: No lymphadenopathy of posterior or anterior cervical chain or axillae bilaterally.  Gait Very mild antalgic gait  MSK:  Non tender with full range of motion and good stability and symmetric strength and tone of shoulders, elbows, wrist,  knee and ankles bilaterally.  Hip: Left ROM IR: 15 Deg, ER: 45 Deg, Flexion: 120 Deg, Extension: 100 Deg, Abduction: 45 Deg, Adduction: 25 Deg Strength IR: 5/5, ER: 5/5, Flexion: 5/5, Extension: 5/5, Abduction: 5/5, Adduction: 5/5 Pelvic alignment unremarkable to inspection and palpation. Standing hip rotation and gait without trendelenburg sign / unsteadiness. Greater trochanter with moderate tenderness to palpation. Mild tightness over Corky Sox. No SI joint tenderness and normal minimal SI movement. Contralateral hip unremarkable   Impression and Recommendations:     This case required medical decision making of moderate complexity.      Note: This dictation was prepared with Dragon dictation along with smaller phrase technology. Any transcriptional errors that result from this process are unintentional.

## 2016-02-19 ENCOUNTER — Ambulatory Visit (INDEPENDENT_AMBULATORY_CARE_PROVIDER_SITE_OTHER): Payer: Commercial Managed Care - HMO | Admitting: Family Medicine

## 2016-02-19 ENCOUNTER — Encounter: Payer: Self-pay | Admitting: Family Medicine

## 2016-02-19 DIAGNOSIS — M7062 Trochanteric bursitis, left hip: Secondary | ICD-10-CM

## 2016-02-19 NOTE — Assessment & Plan Note (Signed)
Patient seems to be resolving or any on her own. We discussed topical anti-inflammatories, we discussed icing regimen, we discussed home exercises and patient given handout. We discussed which activities doing which ones to avoid. Patient will continue to be active. Follow-up again with me in 4 weeks if not resolved and we'll consider injection. Spent  25 minutes with patient face-to-face and had greater than 50% of counseling including as described above in assessment and plan.

## 2016-02-19 NOTE — Patient Instructions (Addendum)
Good to see you.  Sorry for the wait. Ice 20 minutes  before bed. pennsaid pinkie amount topically 2 times daily as needed.  Try to sleep with a pillow between knees.  Exercises 3 times a week.  See me again in 3-4 weeks if not better Happy New Year! 4

## 2016-03-18 ENCOUNTER — Ambulatory Visit: Payer: Commercial Managed Care - HMO | Admitting: Family Medicine

## 2016-06-03 DIAGNOSIS — M5410 Radiculopathy, site unspecified: Secondary | ICD-10-CM | POA: Diagnosis not present

## 2016-06-03 DIAGNOSIS — I10 Essential (primary) hypertension: Secondary | ICD-10-CM | POA: Diagnosis not present

## 2016-06-03 DIAGNOSIS — E119 Type 2 diabetes mellitus without complications: Secondary | ICD-10-CM | POA: Diagnosis not present

## 2016-06-03 DIAGNOSIS — Z1389 Encounter for screening for other disorder: Secondary | ICD-10-CM | POA: Diagnosis not present

## 2016-06-03 DIAGNOSIS — R600 Localized edema: Secondary | ICD-10-CM | POA: Diagnosis not present

## 2016-06-03 DIAGNOSIS — Z6835 Body mass index (BMI) 35.0-35.9, adult: Secondary | ICD-10-CM | POA: Diagnosis not present

## 2016-06-03 DIAGNOSIS — E782 Mixed hyperlipidemia: Secondary | ICD-10-CM | POA: Diagnosis not present

## 2016-06-03 DIAGNOSIS — M545 Low back pain: Secondary | ICD-10-CM | POA: Diagnosis not present

## 2016-06-06 ENCOUNTER — Ambulatory Visit (INDEPENDENT_AMBULATORY_CARE_PROVIDER_SITE_OTHER)
Admission: RE | Admit: 2016-06-06 | Discharge: 2016-06-06 | Disposition: A | Discharge status: Home / Self Care | Payer: Medicare HMO | Source: Ambulatory Visit | Attending: Family Medicine | Admitting: Family Medicine

## 2016-06-06 ENCOUNTER — Encounter: Payer: Self-pay | Admitting: Family Medicine

## 2016-06-06 ENCOUNTER — Ambulatory Visit (INDEPENDENT_AMBULATORY_CARE_PROVIDER_SITE_OTHER): Payer: Medicare HMO | Admitting: Family Medicine

## 2016-06-06 DIAGNOSIS — M5416 Radiculopathy, lumbar region: Secondary | ICD-10-CM

## 2016-06-06 DIAGNOSIS — M545 Low back pain: Secondary | ICD-10-CM | POA: Diagnosis not present

## 2016-06-06 MED ORDER — METHYLPREDNISOLONE ACETATE 80 MG/ML IJ SUSP
80.0000 mg | Freq: Once | INTRAMUSCULAR | Status: AC
  Administered 2016-06-06: 80 mg via INTRAMUSCULAR

## 2016-06-06 MED ORDER — KETOROLAC TROMETHAMINE 60 MG/2ML IM SOLN
60.0000 mg | Freq: Once | INTRAMUSCULAR | Status: AC
  Administered 2016-06-06: 60 mg via INTRAMUSCULAR

## 2016-06-06 NOTE — Assessment & Plan Note (Signed)
Patient is having signs and symptoms now more of a lumbar radiculopathy. In December weakness now as well as positive straight leg test. Past medical history is significant for arthritic changes of the lower spine as well as postoperative changes of L4 on L5. Radicular symptoms seemed to correspond with an L5 nerve root impingement. Patient is to do more the icing regimen, increased gabapentin up to 3 times daily. We discussed icing regimen as well. Patient will come back after advance imaging with her now having the weakness. Depending on the MRI findings this could change medical management. Patient could be a candidate for potential epidural injections.

## 2016-06-06 NOTE — Patient Instructions (Signed)
Good to see you.  Xray downstairs.  2 injections today  Ice is your friend.  Stay active but new exercises 3 times a week.  Increase gabapentin to 1 pill in AM, 1 pill in PM and 2 pills at night Send me a message Thursday and if not better we will get MRI.  Worsening symptoms like weakness of leg please seek medical attention immediately.

## 2016-06-06 NOTE — Progress Notes (Signed)
Pre-visit discussion using our clinic review tool. No additional management support is needed unless otherwise documented below in the visit note.  

## 2016-06-06 NOTE — Progress Notes (Signed)
[name] [place], [place] [address] Phone: [phone] Subjective:    I'm seeing this patient by the request  of:  KOBERLEIN, Kassie Mends, MD   CC:  Left hip pain f/u   XAJ:[id number]  Sabrina Taylor is a 71 y.o. female coming in with complaint of left hip pain. Past medical history is significant for low back pain as well as piriformis syndrome previously. Patient was diagnosed previously with more of a greater trochanteric bursitis. Patient states that the pain is change. Seems to be significantly more located in the back. He is having increasing radicular symptoms going down the leg as well as mild weakness. Patient states that it is affecting daily activities and is even wake her up at night. At first the medication was helping but now no longer.   past imaging includes an MRI of the lumbar spine in August 2014. This was independently visualized by me. Patient does have postoperative changes on the left L4-L5 and mild disc changing at L5-S1.  Past Medical History:  Diagnosis Date  . DDD (degenerative disc disease)    Low back pain  . First degree AV block   . Hyperlipidemia   . Hypertension   . LBBB (left bundle branch block)   . Obesity   . PAF (paroxysmal atrial fibrillation) ([place])   . PONV (postoperative nausea and vomiting)    Past Surgical History:  Procedure Laterality Date  . APPENDECTOMY  1960  . BACK SURGERY  may 2011  . CATARACT EXTRACTION W/PHACO Right 06/05/2014   Procedure: CATARACT EXTRACTION PHACO AND INTRAOCULAR LENS PLACEMENT (IOC);  Surgeon: Tonny Branch, MD;  Location: [facility];  Service: Ophthalmology;  Laterality: Right;  CDE: 6.42  . CATARACT EXTRACTION W/PHACO Left 06/19/2014   Procedure: CATARACT EXTRACTION PHACO AND INTRAOCULAR LENS PLACEMENT (IOC);  Surgeon: Tonny Branch, MD;  Location: [facility];  Service: Ophthalmology;  Laterality: Left;  CDE 5.61  . CHOLECYSTECTOMY    . COLONOSCOPY  03/25/2011   Procedure:  COLONOSCOPY;  Surgeon: Jamesetta So, MD;  Location: [facility];  Service: Gastroenterology;  Laterality: N/A;  . KNEE ARTHROSCOPY  2004, 2008   Right  . TOTAL ABDOMINAL HYSTERECTOMY     Social History   Social History  . Marital status: Married    Spouse name: N/A  . Number of children: N/A  . Years of education: N/A   Occupational History  . 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 Smoking status: Never Smoker  . Smokeless tobacco: Never Used  . Alcohol use No  . Drug use: No  . Sexual activity: Not Asked   Other Topics Concern  . None   Social History Narrative   Married with one adult child, lives in [place]   No regular exercise   Allergies  Allergen Reactions  . Penicillins Hives   Family History  Problem Relation Age of Onset  . Hypertension Mother   . Alzheimer's disease Mother   . Stroke Father   . Coronary artery disease Brother     Past medical history, social, surgical and family history all reviewed in electronic medical record.  No pertanent information unless stated regarding to the chief complaint.   Review of Systems: No headache, visual changes, nausea, vomiting, diarrhea, constipation, dizziness, abdominal pain, skin rash, fevers, chills, night sweats, weight loss, swollen lymph nodes, body aches, chest pain, shortness of breath, mood changes.  Positive body aches  and muscle aches  Objective  There were no vitals taken for this visit. Systems examined below as of 06/06/16   General: No apparent distress alert and oriented x3 mood and affect normal, dressed appropriately.  HEENT: Pupils equal, extraocular movements intact  Respiratory: Patient's speak in full sentences and does not appear short of breath  Cardiovascular: No lower extremity edema, non tender, no erythema  Skin: Warm dry intact with no signs of infection or rash on extremities or on axial skeleton.  Abdomen: Soft nontender  Neuro:  Cranial nerves II through XII are intact, neurovascularly intact in all extremities with 2+ DTRs and 2+ pulses.  Lymph: No lymphadenopathy of posterior or anterior cervical chain or axillae bilaterally.  Gait Very mild antalgic gait  MSK:  Non tender with full range of motion and good stability and symmetric strength and tone of shoulders, elbows, wrist,  knee and ankles bilaterally.  Hip: Left ROM IR: 15 Deg, ER: 45 Deg, Flexion: 120 Deg, Extension: 100 Deg, Abduction: 45 Deg, Adduction: 25 Deg Strength 4/5 compared to the contralateral side in all planes Pelvic alignment unremarkable to inspection and palpation. Standing hip rotation and gait without trendelenburg sign / unsteadiness. Greater trochanter with moderate tenderness to palpation. Negative Faber No SI joint tenderness and normal minimal SI movement. Contralateral hip unremarkable Back exam shows the patient has significant tenderness to palpation over the piriformis musculature of the lumbar spine. Positive straight leg test. Strength 4 out of 5 compared to the contralateral side.   Impression and Recommendations:     This case required medical decision making of moderate complexity.      Note: This dictation was prepared with Dragon dictation along with smaller phrase technology. Any transcriptional errors that result from this process are unintentional.

## 2016-06-13 ENCOUNTER — Encounter: Payer: Self-pay | Admitting: Family Medicine

## 2016-06-16 ENCOUNTER — Other Ambulatory Visit: Payer: Self-pay

## 2016-06-16 DIAGNOSIS — M5416 Radiculopathy, lumbar region: Secondary | ICD-10-CM

## 2016-06-17 DIAGNOSIS — E782 Mixed hyperlipidemia: Secondary | ICD-10-CM | POA: Diagnosis not present

## 2016-06-17 DIAGNOSIS — Z6834 Body mass index (BMI) 34.0-34.9, adult: Secondary | ICD-10-CM | POA: Diagnosis not present

## 2016-06-17 DIAGNOSIS — I10 Essential (primary) hypertension: Secondary | ICD-10-CM | POA: Diagnosis not present

## 2016-06-17 DIAGNOSIS — R7309 Other abnormal glucose: Secondary | ICD-10-CM | POA: Diagnosis not present

## 2016-06-17 DIAGNOSIS — R200 Anesthesia of skin: Secondary | ICD-10-CM | POA: Diagnosis not present

## 2016-06-27 ENCOUNTER — Other Ambulatory Visit (HOSPITAL_COMMUNITY): Payer: Self-pay | Admitting: Registered Nurse

## 2016-06-27 DIAGNOSIS — Z1231 Encounter for screening mammogram for malignant neoplasm of breast: Secondary | ICD-10-CM

## 2016-06-30 ENCOUNTER — Ambulatory Visit (HOSPITAL_COMMUNITY)
Admission: RE | Admit: 2016-06-30 | Discharge: 2016-06-30 | Disposition: A | Discharge status: Home / Self Care | Payer: Medicare HMO | Source: Ambulatory Visit | Attending: Registered Nurse | Admitting: Registered Nurse

## 2016-06-30 DIAGNOSIS — Z1231 Encounter for screening mammogram for malignant neoplasm of breast: Secondary | ICD-10-CM | POA: Insufficient documentation

## 2016-07-11 ENCOUNTER — Ambulatory Visit
Admission: RE | Admit: 2016-07-11 | Discharge: 2016-07-11 | Disposition: A | Discharge status: Home / Self Care | Payer: Medicare HMO | Source: Ambulatory Visit | Attending: Family Medicine | Admitting: Family Medicine

## 2016-07-11 DIAGNOSIS — M48061 Spinal stenosis, lumbar region without neurogenic claudication: Secondary | ICD-10-CM | POA: Diagnosis not present

## 2016-07-11 DIAGNOSIS — M5416 Radiculopathy, lumbar region: Secondary | ICD-10-CM

## 2016-07-18 ENCOUNTER — Encounter: Payer: Self-pay | Admitting: Family Medicine

## 2016-07-18 DIAGNOSIS — M5416 Radiculopathy, lumbar region: Secondary | ICD-10-CM

## 2016-07-22 ENCOUNTER — Encounter: Payer: Self-pay | Admitting: *Deleted

## 2016-08-08 NOTE — Telephone Encounter (Signed)
Sabrina Taylor this pt called in and wanted to see the status on this referral?  Can you call her   Best number [phone]

## 2016-12-15 DIAGNOSIS — Z0000 Encounter for general adult medical examination without abnormal findings: Secondary | ICD-10-CM | POA: Diagnosis not present

## 2016-12-15 DIAGNOSIS — E6609 Other obesity due to excess calories: Secondary | ICD-10-CM | POA: Diagnosis not present

## 2016-12-15 DIAGNOSIS — E119 Type 2 diabetes mellitus without complications: Secondary | ICD-10-CM | POA: Diagnosis not present

## 2016-12-15 DIAGNOSIS — Z1389 Encounter for screening for other disorder: Secondary | ICD-10-CM | POA: Diagnosis not present

## 2016-12-15 DIAGNOSIS — Z6834 Body mass index (BMI) 34.0-34.9, adult: Secondary | ICD-10-CM | POA: Diagnosis not present

## 2016-12-15 DIAGNOSIS — I10 Essential (primary) hypertension: Secondary | ICD-10-CM | POA: Diagnosis not present

## 2016-12-22 DIAGNOSIS — E6609 Other obesity due to excess calories: Secondary | ICD-10-CM | POA: Diagnosis not present

## 2016-12-22 DIAGNOSIS — Z23 Encounter for immunization: Secondary | ICD-10-CM | POA: Diagnosis not present

## 2016-12-22 DIAGNOSIS — Z0000 Encounter for general adult medical examination without abnormal findings: Secondary | ICD-10-CM | POA: Diagnosis not present

## 2016-12-22 DIAGNOSIS — E782 Mixed hyperlipidemia: Secondary | ICD-10-CM | POA: Diagnosis not present

## 2016-12-22 DIAGNOSIS — I10 Essential (primary) hypertension: Secondary | ICD-10-CM | POA: Diagnosis not present

## 2016-12-22 DIAGNOSIS — Z1389 Encounter for screening for other disorder: Secondary | ICD-10-CM | POA: Diagnosis not present

## 2016-12-22 DIAGNOSIS — R7309 Other abnormal glucose: Secondary | ICD-10-CM | POA: Diagnosis not present

## 2016-12-22 DIAGNOSIS — Z6834 Body mass index (BMI) 34.0-34.9, adult: Secondary | ICD-10-CM | POA: Diagnosis not present

## 2017-09-08 DIAGNOSIS — E119 Type 2 diabetes mellitus without complications: Secondary | ICD-10-CM | POA: Diagnosis not present

## 2017-09-08 DIAGNOSIS — E6609 Other obesity due to excess calories: Secondary | ICD-10-CM | POA: Diagnosis not present

## 2017-09-08 DIAGNOSIS — Z6832 Body mass index (BMI) 32.0-32.9, adult: Secondary | ICD-10-CM | POA: Diagnosis not present

## 2017-09-08 DIAGNOSIS — Z1389 Encounter for screening for other disorder: Secondary | ICD-10-CM | POA: Diagnosis not present

## 2017-09-08 DIAGNOSIS — E1140 Type 2 diabetes mellitus with diabetic neuropathy, unspecified: Secondary | ICD-10-CM | POA: Diagnosis not present

## 2018-01-25 DIAGNOSIS — E6609 Other obesity due to excess calories: Secondary | ICD-10-CM | POA: Diagnosis not present

## 2018-01-25 DIAGNOSIS — Z1389 Encounter for screening for other disorder: Secondary | ICD-10-CM | POA: Diagnosis not present

## 2018-01-25 DIAGNOSIS — Z23 Encounter for immunization: Secondary | ICD-10-CM | POA: Diagnosis not present

## 2018-01-25 DIAGNOSIS — M5410 Radiculopathy, site unspecified: Secondary | ICD-10-CM | POA: Diagnosis not present

## 2018-01-25 DIAGNOSIS — M545 Low back pain: Secondary | ICD-10-CM | POA: Diagnosis not present

## 2018-01-25 DIAGNOSIS — Z6830 Body mass index (BMI) 30.0-30.9, adult: Secondary | ICD-10-CM | POA: Diagnosis not present

## 2018-02-23 DIAGNOSIS — Z6830 Body mass index (BMI) 30.0-30.9, adult: Secondary | ICD-10-CM | POA: Diagnosis not present

## 2018-02-23 DIAGNOSIS — I10 Essential (primary) hypertension: Secondary | ICD-10-CM | POA: Diagnosis not present

## 2018-02-23 DIAGNOSIS — E6609 Other obesity due to excess calories: Secondary | ICD-10-CM | POA: Diagnosis not present

## 2018-02-23 DIAGNOSIS — M545 Low back pain: Secondary | ICD-10-CM | POA: Diagnosis not present

## 2018-02-23 DIAGNOSIS — G8929 Other chronic pain: Secondary | ICD-10-CM | POA: Diagnosis not present

## 2018-03-03 NOTE — Progress Notes (Signed)
[facility] [place], [place] [address] Phone: [phone] Subjective:   Sabrina Taylor, am serving as a scribe for Dr. Hulan Saas.   CC: Left lower back  [id number]  Sabrina Taylor is a 73 y.o. female coming in with complaint of left sided lower back pain that started in December 2019. Pain is radiating down the left leg and starts in the glute. Is using a muscle relaxer at night but has has little relief. Is using 600 mg of gabapentin bid and zanaflex at night for pain.      Past Medical History:  Diagnosis Date  . DDD (degenerative disc disease)    Low back pain  . First degree AV block   . Hyperlipidemia   . Hypertension   . LBBB (left bundle branch block)   . Obesity   . PAF (paroxysmal atrial fibrillation) ([place])   . PONV (postoperative nausea and vomiting)    Past Surgical History:  Procedure Laterality Date  . APPENDECTOMY  1960  . BACK SURGERY  may 2011  . CATARACT EXTRACTION W/PHACO Right 06/05/2014   Procedure: CATARACT EXTRACTION PHACO AND INTRAOCULAR LENS PLACEMENT (IOC);  Surgeon: Tonny Branch, MD;  Location: [facility];  Service: Ophthalmology;  Laterality: Right;  CDE: 6.42  . CATARACT EXTRACTION W/PHACO Left 06/19/2014   Procedure: CATARACT EXTRACTION PHACO AND INTRAOCULAR LENS PLACEMENT (IOC);  Surgeon: Tonny Branch, MD;  Location: [facility];  Service: Ophthalmology;  Laterality: Left;  CDE 5.61  . CHOLECYSTECTOMY    . COLONOSCOPY  03/25/2011   Procedure: COLONOSCOPY;  Surgeon: Jamesetta So, MD;  [facility];  Service: Gastroenterology;  Laterality: N/A;  . KNEE ARTHROSCOPY  2004, 2008   Right  . TOTAL ABDOMINAL HYSTERECTOMY     Social History   Socioeconomic History  . Marital status: Married    Spouse name: Not on file  . Number of children: Not on file  . Years of education: Not on file  . Highest education level: Not on file  Occupational History  . 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 Financial resource strain: Not on file  . Food insecurity:    Worry: Not on file    Inability: Not on file  . Transportation needs:    Medical: Not on file    Non-medical: Not on file  Tobacco Use  . Smoking status: Never Smoker  . Smokeless tobacco: Never Used  Substance and Sexual Activity  . Alcohol use: Taylor  . Drug use: Taylor  . Sexual activity: Not on file  Lifestyle  . Physical activity:    Days per week: Not on file    Minutes per session: Not on file  . Stress: Not on file  Relationships  . Social connections:    Talks on phone: Not on file    Gets together: Not on file    Attends religious service: Not on file    Active member of club or organization: Not on file    Attends meetings of clubs or organizations: Not on file    Relationship status: Not on file  Other Topics Concern  . Not on file  Social History Narrative   Married with one adult child, lives in [place]   Taylor regular exercise   Allergies  Allergen Reactions  . Penicillins Hives   Family History  Problem Relation Age of Onset  . Hypertension Mother   .  Alzheimer's disease Mother   . Stroke Father   . Coronary artery disease Brother     Current Outpatient Medications (Endocrine & Metabolic):  .  metFORMIN (GLUCOPHAGE) 500 MG tablet, Take 500 mg by mouth daily with breakfast. .  predniSONE (DELTASONE) 50 MG tablet, Take 1 tablet (50 mg total) by mouth daily.  Current Outpatient Medications (Cardiovascular):  .  atorvastatin (LIPITOR) 20 MG tablet, take 1 tablet by mouth once daily .  hydrochlorothiazide (HYDRODIURIL) 12.5 MG tablet, Take 12.5 mg by mouth daily. Marland Kitchen  lisinopril (PRINIVIL,ZESTRIL) 5 MG tablet, Take 5 mg by mouth daily. .  metoprolol succinate (TOPROL-XL) 25 MG 24 hr tablet, take 1 tablet by mouth once daily  Current Outpatient Medications (Respiratory):  .  loratadine (CLARITIN) 10 MG tablet, Take 10 mg by mouth daily.  Current  Outpatient Medications (Analgesics):  .  aspirin 81 MG EC tablet, Take 81 mg by mouth daily.   Marland Kitchen  ibuprofen (ADVIL,MOTRIN) 200 MG tablet, Take 400 mg by mouth every 6 (six) hours as needed for mild pain.  .  naproxen (NAPROSYN) 500 MG tablet, Take 500 mg by mouth 2 (two) times daily with a meal.  Current Outpatient Medications (Hematological):  Marland Kitchen  Cyanocobalamin 2500 MCG CHEW, Chew 1 tablet by mouth daily.  Current Outpatient Medications (Other):  Marland Kitchen  Calcium Carbonate-Vitamin D (CALCIUM PLUS VITAMIN D) 500-50 MG-UNIT CAPS, Take 1 tablet by mouth daily.  .  Cinnamon 500 MG TABS, Take 500 mg by mouth daily.  Marland Kitchen  gabapentin (NEURONTIN) 300 MG capsule, Take 300 mg by mouth at bedtime. .  Misc Natural Products (BLACK CHERRY CONCENTRATE PO), Take 1 tablet by mouth daily. Marland Kitchen  tiZANidine (ZANAFLEX) 4 MG tablet, Take 4 mg by mouth every 6 (six) hours as needed for muscle spasms. .  TURMERIC PO, Take 1 capsule by mouth daily.    Past medical history, social, surgical and family history all reviewed in electronic medical record.  Taylor pertanent information unless stated regarding to the chief complaint.   Review of Systems:  Taylor headache, visual changes, nausea, vomiting, diarrhea, constipation, dizziness, abdominal pain, skin rash, fevers, chills, night sweats, weight loss, swollen lymph nodes, body aches, joint swelling,chest pain, shortness of breath, mood changes.  Positive muscle aches  Objective  Blood pressure (!) 130/94, pulse (!) 53, height 5\' 8"  (1.727 m), weight 210 lb (95.3 kg), SpO2 99 %. Systems examined below as of  General: Taylor apparent distress alert and oriented x3 mood and affect normal, dressed appropriately.  HEENT: Pupils equal, extraocular movements intact  Respiratory: Patient's speak in full sentences and does not appear short of breath  Cardiovascular: Taylor lower extremity edema, non tender, Taylor erythema  Skin: Warm dry intact with Taylor signs of infection or rash on extremities or  on axial skeleton.  Abdomen: Soft nontender  Neuro: Cranial nerves II through XII are intact, neurovascularly intact in all extremities with 2+ DTRs and 2+ pulses.  Lymph: Taylor lymphadenopathy of posterior or anterior cervical chain or axillae bilaterally.  Gait normal with good balance and coordination.  Cautious MSK:  tender with full range of motion and good stability and symmetric strength and tone of shoulders, elbows, wrist, hip, knee and ankles bilaterally.  Back exam shows some mild loss of lordosis.  Severely tender to palpation in the paraspinal musculature on the left side lumbar spine and sacrum  Positive straight leg test noted and 25 degrees of forward flexion.  Tightness noted on Faber test but symmetric to  the contralateral side.  Deep tendon reflexes intact.  5 out of 5 strength of the lower extremities bilaterally.     Impression and Recommendations:     This case required medical decision making of moderate complexity. The above documentation has been reviewed and is accurate and complete Lyndal Pulley, DO       Note: This dictation was prepared with Dragon dictation along with smaller phrase technology. Any transcriptional errors that result from this process are unintentional.

## 2018-03-05 ENCOUNTER — Encounter: Payer: Self-pay | Admitting: Family Medicine

## 2018-03-05 ENCOUNTER — Ambulatory Visit (INDEPENDENT_AMBULATORY_CARE_PROVIDER_SITE_OTHER): Payer: Medicare HMO | Admitting: Family Medicine

## 2018-03-05 VITALS — BP 130/94 | HR 53 | Ht 68.0 in | Wt 210.0 lb

## 2018-03-05 DIAGNOSIS — M5416 Radiculopathy, lumbar region: Secondary | ICD-10-CM | POA: Diagnosis not present

## 2018-03-05 MED ORDER — METHYLPREDNISOLONE ACETATE 80 MG/ML IJ SUSP
80.0000 mg | Freq: Once | INTRAMUSCULAR | Status: AC
  Administered 2018-03-05: 80 mg via INTRAMUSCULAR

## 2018-03-05 MED ORDER — KETOROLAC TROMETHAMINE 60 MG/2ML IM SOLN
60.0000 mg | Freq: Once | INTRAMUSCULAR | Status: AC
  Administered 2018-03-05: 60 mg via INTRAMUSCULAR

## 2018-03-05 MED ORDER — PREDNISONE 50 MG PO TABS: 50.0000 mg | ORAL_TABLET | Freq: Every day | ORAL | 0 refills | Status: DC

## 2018-03-05 NOTE — Assessment & Plan Note (Signed)
Patient does have lumbar radiculopathy.  Seems to be in the S1 distribution.  MRI did show a S1 nerve root impingement this is consistent with that at the moment.  Likely no weakness or even significant deep tendon reflex difficulties at the moment.  Patient is to do home exercises, given 2 injections today to help with inflammation, prednisone for 5 days.  If does not help we will consider epidural.  Follow-up again 4 to 8 weeks

## 2018-03-05 NOTE — Patient Instructions (Addendum)
Good to see you  2 injections today  Prednisone daily for 5 days starting tomorrow  Increase gabapentin 600mg  in AM, 300mg  in PM and 600mg  at night Exercises 3 times a week.  See em again in 2 weeks Call or write in 1 week if not better and we will consider epidural  Happy New Year!

## 2018-03-11 ENCOUNTER — Other Ambulatory Visit (HOSPITAL_COMMUNITY): Payer: Self-pay | Admitting: Family Medicine

## 2018-03-11 DIAGNOSIS — Z1231 Encounter for screening mammogram for malignant neoplasm of breast: Secondary | ICD-10-CM

## 2018-03-24 NOTE — Progress Notes (Signed)
[facility] [place], [place] [address] Phone: [phone] Subjective:   Fontaine No, am serving as a scribe for Dr. Hulan Saas.\ I'm seeing this patient by the request  of:    CC:  Back pain   [id number]   Sabrina Taylor is a 73 y.o. female coming in with complaint of left sided lower back pain that started in December 2019.   Update 03/26/2018: Sabrina Taylor is a 73 y.o. female coming in with complaint of back pain. Continues to have pain. Feels unchanged since last visit. Has taken an extra gabapentin during the day but that is not helping her pain. Cannot sleep at night. Does have radicular symptoms, left leg. Was using zanaflex which helped her go to sleep but she was unable to stay asleep. No longer using zanaflex as she ran out of medication.  Patient states that the pain is worsening overall.  Affecting daily activities.  Going down her legs, sometimes feels like there is somewhat weakness noted as well.   MRI showed that patient did have a left posterior lateral disc herniation at L5-S1 causing left S1 nerve impingement.    Past Medical History:  Diagnosis Date  . DDD (degenerative disc disease)    Low back pain  . First degree AV block   . Hyperlipidemia   . Hypertension   . LBBB (left bundle branch block)   . Obesity   . PAF (paroxysmal atrial fibrillation) ([facility])   . PONV (postoperative nausea and vomiting)    Past Surgical History:  Procedure Laterality Date  . APPENDECTOMY  1960  . BACK SURGERY  may 2011  . CATARACT EXTRACTION W/PHACO Right 06/05/2014   Procedure: CATARACT EXTRACTION PHACO AND INTRAOCULAR LENS PLACEMENT (IOC);  Surgeon: Tonny Branch, MD;  Location: [facility];  Service: Ophthalmology;  Laterality: Right;  CDE: 6.42  . CATARACT EXTRACTION W/PHACO Left 06/19/2014   Procedure: CATARACT EXTRACTION PHACO AND INTRAOCULAR LENS PLACEMENT (IOC);  Surgeon: Tonny Branch, MD;  Location: [facility];  Service: Ophthalmology;   Laterality: Left;  CDE 5.61  . CHOLECYSTECTOMY    . COLONOSCOPY  03/25/2011   Procedure: COLONOSCOPY;  Surgeon: Jamesetta So, MD;  Location: [facility];  Service: Gastroenterology;  Laterality: N/A;  . KNEE ARTHROSCOPY  2004, 2008   Right  . TOTAL ABDOMINAL HYSTERECTOMY     Social History   Socioeconomic History  . Marital status: Married    Spouse name: Not on file  . Number of children: Not on file  . Years of education: Not on file  . Highest education level: Not on file  Occupational History  . 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 Financial resource strain: Not on file  . Food insecurity:    Worry: Not on file    Inability: Not on file  . Transportation needs:    Medical: Not on file    Non-medical: Not on file  Tobacco Use  . Smoking status: Never Smoker  . Smokeless tobacco: Never Used  Substance and Sexual Activity  . Alcohol use: No  . Drug use: No  . Sexual activity: Not on file  Lifestyle  . Physical activity:    Days per week: Not on file    Minutes per session: Not on file  . Stress: Not on file  Relationships  . Social connections:    Talks on phone: Not on file  Gets together: Not on file    Attends religious service: Not on file    Active member of club or organization: Not on file    Attends meetings of clubs or organizations: Not on file    Relationship status: Not on file  Other Topics Concern  . Not on file  Social History Narrative   Married with one adult child, lives in [place]   No regular exercise   Allergies  Allergen Reactions  . Penicillins Hives   Family History  Problem Relation Age of Onset  . Hypertension Mother   . Alzheimer's disease Mother   . Stroke Father   . Coronary artery disease Brother     Current Outpatient Medications (Endocrine & Metabolic):  .  metFORMIN (GLUCOPHAGE) 500 MG tablet, Take 500 mg by mouth daily with breakfast.  Current Outpatient  Medications (Cardiovascular):  .  atorvastatin (LIPITOR) 20 MG tablet, take 1 tablet by mouth once daily .  hydrochlorothiazide (HYDRODIURIL) 12.5 MG tablet, Take 12.5 mg by mouth daily. Marland Kitchen  lisinopril (PRINIVIL,ZESTRIL) 5 MG tablet, Take 10 mg by mouth daily.  .  metoprolol succinate (TOPROL-XL) 25 MG 24 hr tablet, take 1 tablet by mouth once daily  Current Outpatient Medications (Respiratory):  .  loratadine (CLARITIN) 10 MG tablet, Take 10 mg by mouth daily.  Current Outpatient Medications (Analgesics):  .  aspirin 81 MG EC tablet, Take 81 mg by mouth daily.   Marland Kitchen  ibuprofen (ADVIL,MOTRIN) 200 MG tablet, Take 400 mg by mouth every 6 (six) hours as needed for mild pain.  .  naproxen (NAPROSYN) 500 MG tablet, Take 500 mg by mouth 2 (two) times daily with a meal.  Current Outpatient Medications (Hematological):  Marland Kitchen  Cyanocobalamin 2500 MCG CHEW, Chew 1 tablet by mouth daily.  Current Outpatient Medications (Other):  Marland Kitchen  Calcium Carbonate-Vitamin D (CALCIUM PLUS VITAMIN D) 500-50 MG-UNIT CAPS, Take 1 tablet by mouth daily.  .  Cinnamon 500 MG TABS, Take 500 mg by mouth daily.  Marland Kitchen  gabapentin (NEURONTIN) 300 MG capsule, Take 300 mg by mouth at bedtime. .  Misc Natural Products (BLACK CHERRY CONCENTRATE PO), Take 1 tablet by mouth daily. .  TURMERIC PO, Take 1 capsule by mouth daily. .  traZODone (DESYREL) 50 MG tablet, Take 0.5-1 tablets (25-50 mg total) by mouth at bedtime as needed for sleep.    Past medical history, social, surgical and family history all reviewed in electronic medical record.  No pertanent information unless stated regarding to the chief complaint.   Review of Systems:  No headache, visual changes, nausea, vomiting, diarrhea, constipation, dizziness, abdominal pain, skin rash, fevers, chills, night sweats, weight loss, swollen lymph nodes, body aches, joint swelling, muscle aches, chest pain, shortness of breath, mood changes.  Positive muscle aches  Objective  Blood  pressure (!) 148/102, pulse 60, height 5\' 8"  (1.727 m), weight 202 lb (91.6 kg), SpO2 97 %.   General: No apparent distress alert and oriented x3 mood and affect normal, dressed appropriately.  HEENT: Pupils equal, extraocular movements intact  Respiratory: Patient's speak in full sentences and does not appear short of breath  Cardiovascular: No lower extremity edema, non tender, no erythema  Skin: Warm dry intact with no signs of infection or rash on extremities or on axial skeleton.  Abdomen: Soft nontender  Neuro: Cranial nerves II through XII are intact, neurovascularly intact in all extremities with 2+ DTRs and 2+ pulses.  Lymph: No lymphadenopathy of posterior or anterior cervical chain or  axillae bilaterally.  Gait antalgic.  MSK:  Non tender with full range of motion and good stability and symmetric strength and tone of shoulders, elbows, wrist, hip, knee and ankles bilaterally.  Back Exam:  Inspection: Loss of lordosis Motion: Flexion 35 deg, Extension 25 deg, Side Bending to 35 deg bilaterally, Rotation to 35 deg bilaterally  SLR laying: Positive left at 20 degrees XSLR laying: Negative  Palpable tenderness: Tender to palpation left side. FABER: Positive Faber on the left. Sensory change: Gross sensation intact to all lumbar and sacral dermatomes.  Reflexes: 2+ at both patellar tendons, 2+ at achilles tendons, Babinski's downgoing.  Strength at foot  4-5 strength on the left compared to the right Leg strength  Quad: 5/5 Hamstring: 5/5 Hip flexor: 5/5 Hip abductors: 4/5          Impression and Recommendations:     This case required medical decision making of moderate complexity. The above documentation has been reviewed and is accurate and complete Lyndal Pulley, DO       Note: This dictation was prepared with Dragon dictation along with smaller phrase technology. Any transcriptional errors that result from this process are unintentional.

## 2018-03-26 ENCOUNTER — Ambulatory Visit (INDEPENDENT_AMBULATORY_CARE_PROVIDER_SITE_OTHER): Payer: Medicare HMO | Admitting: Family Medicine

## 2018-03-26 ENCOUNTER — Encounter: Payer: Self-pay | Admitting: Family Medicine

## 2018-03-26 VITALS — BP 148/102 | HR 60 | Ht 68.0 in | Wt 202.0 lb

## 2018-03-26 DIAGNOSIS — M5416 Radiculopathy, lumbar region: Secondary | ICD-10-CM

## 2018-03-26 DIAGNOSIS — M545 Low back pain, unspecified: Secondary | ICD-10-CM

## 2018-03-26 DIAGNOSIS — G8929 Other chronic pain: Secondary | ICD-10-CM | POA: Diagnosis not present

## 2018-03-26 MED ORDER — TRAZODONE HCL 50 MG PO TABS: 25.0000 mg | ORAL_TABLET | Freq: Every evening | ORAL | 3 refills | Status: DC | PRN

## 2018-03-26 NOTE — Assessment & Plan Note (Signed)
Worsening symptoms, S1 nerve root has been impingement noted on MRI.  Patient symptoms are corresponding with this as well.  Patient is sent for an S1 nerve root impingement nerve root injection.  Hopefully patient will respond to this.  This could be diagnostic as well as therapeutic.  Encourage patient to continue gabapentin but given trial of a longer term weight as well.  Follow-up again in 4 weeks

## 2018-03-26 NOTE — Patient Instructions (Addendum)
Great to see you  Happy New Year!  Try the horizant instead of gabapentin  If after 2-3 nights still having trouble with sleep try 1/2 tab of the trazadone  Call [phone] to set up injection  Once you know when the injection is then I want to see you again 2-3 weeks after to discuss

## 2018-04-01 ENCOUNTER — Ambulatory Visit
Admission: RE | Admit: 2018-04-01 | Discharge: 2018-04-01 | Disposition: A | Discharge status: Home / Self Care | Payer: Medicare HMO | Source: Ambulatory Visit | Attending: Family Medicine | Admitting: Family Medicine

## 2018-04-01 DIAGNOSIS — M47817 Spondylosis without myelopathy or radiculopathy, lumbosacral region: Secondary | ICD-10-CM | POA: Diagnosis not present

## 2018-04-01 DIAGNOSIS — G8929 Other chronic pain: Secondary | ICD-10-CM

## 2018-04-01 DIAGNOSIS — M545 Low back pain: Principal | ICD-10-CM

## 2018-04-01 MED ORDER — METHYLPREDNISOLONE ACETATE 40 MG/ML INJ SUSP (RADIOLOG
120.0000 mg | Freq: Once | INTRAMUSCULAR | Status: AC
  Administered 2018-04-01: 120 mg via EPIDURAL

## 2018-04-01 MED ORDER — IOPAMIDOL (ISOVUE-M 200) INJECTION 41%
1.0000 mL | Freq: Once | INTRAMUSCULAR | Status: AC
  Administered 2018-04-01: 1 mL via EPIDURAL

## 2018-04-01 NOTE — Discharge Instructions (Signed)

## 2018-04-05 ENCOUNTER — Other Ambulatory Visit: Payer: Medicare HMO

## 2018-04-06 DIAGNOSIS — E6609 Other obesity due to excess calories: Secondary | ICD-10-CM | POA: Diagnosis not present

## 2018-04-06 DIAGNOSIS — E785 Hyperlipidemia, unspecified: Secondary | ICD-10-CM | POA: Diagnosis not present

## 2018-04-06 DIAGNOSIS — Z6830 Body mass index (BMI) 30.0-30.9, adult: Secondary | ICD-10-CM | POA: Diagnosis not present

## 2018-04-06 DIAGNOSIS — E7800 Pure hypercholesterolemia, unspecified: Secondary | ICD-10-CM | POA: Diagnosis not present

## 2018-04-19 NOTE — Progress Notes (Signed)
[name] [address], [place] [address] Phone: [phone] Subjective:     CC: Back pain follow-up  [id number]  Sabrina Taylor is a 73 y.o. female coming in with complaint of lumbar back pain.  Patient did have an MRI back in 2018 that showed that patient did have L4-L5 facet arthropathy with 2 mm of anterolisthesis as well as stenosis noted at this level and at the left 1 S1 nerve impingement.  Patient was having significant difficulty that was more consistent with the S1 nerve root impingement and undergone a nerve root block April 01, 2018.  Patient states that that the epidural helped a little. Pain still occurring down the back of the leg to the foot. Pain in glute was alleviated though. Pain occurs with sitting or lying down.      Past Medical History:  Diagnosis Date  . DDD (degenerative disc disease)    Low back pain  . First degree AV block   . Hyperlipidemia   . Hypertension   . LBBB (left bundle branch block)   . Obesity   . PAF (paroxysmal atrial fibrillation) ([facility])   . PONV (postoperative nausea and vomiting)    Past Surgical History:  Procedure Laterality Date  . APPENDECTOMY  1960  . BACK SURGERY  may 2011  . CATARACT EXTRACTION W/PHACO Right 06/05/2014   Procedure: CATARACT EXTRACTION PHACO AND INTRAOCULAR LENS PLACEMENT (IOC);  Surgeon: Tonny Branch, MD;  Location: [facility];  Service: Ophthalmology;  Laterality: Right;  CDE: 6.42  . CATARACT EXTRACTION W/PHACO Left 06/19/2014   Procedure: CATARACT EXTRACTION PHACO AND INTRAOCULAR LENS PLACEMENT (IOC);  Surgeon: Tonny Branch, MD;  Location: [facility];  Service: Ophthalmology;  Laterality: Left;  CDE 5.61  . CHOLECYSTECTOMY    . COLONOSCOPY  03/25/2011   Procedure: COLONOSCOPY;  Surgeon: Jamesetta So, MD;  Location: [facility];  Service: Gastroenterology;  Laterality: N/A;  . KNEE ARTHROSCOPY  2004, 2008   Right  . TOTAL ABDOMINAL HYSTERECTOMY     Social History    Socioeconomic History  . Marital status: Married    Spouse name: Not on file  . Number of children: Not on file  . Years of education: Not on file  . Highest education level: Not on file  Occupational History  . 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 Financial resource strain: Not on file  . Food insecurity:    Worry: Not on file    Inability: Not on file  . Transportation needs:    Medical: Not on file    Non-medical: Not on file  Tobacco Use  . Smoking status: Never Smoker  . Smokeless tobacco: Never Used  Substance and Sexual Activity  . Alcohol use: No  . Drug use: No  . Sexual activity: Not on file  Lifestyle  . Physical activity:    Days per week: Not on file    Minutes per session: Not on file  . Stress: Not on file  Relationships  . Social connections:    Talks on phone: Not on file    Gets together: Not on file    Attends religious service: Not on file    Active member of club or organization: Not on file    Attends meetings of clubs or organizations: Not on file    Relationship status: Not on file  Other Topics Concern  . Not on file  Social History Narrative   Married with one adult child, lives in [place]   No regular exercise   Allergies  Allergen Reactions  . Penicillins Hives   Family History  Problem Relation Age of Onset  . Hypertension Mother   . Alzheimer's disease Mother   . Stroke Father   . Coronary artery disease Brother     Current Outpatient Medications (Endocrine & Metabolic):  .  metFORMIN (GLUCOPHAGE) 500 MG tablet, Take 500 mg by mouth daily with breakfast.  Current Outpatient Medications (Cardiovascular):  .  atorvastatin (LIPITOR) 20 MG tablet, take 1 tablet by mouth once daily .  hydrochlorothiazide (HYDRODIURIL) 12.5 MG tablet, Take 12.5 mg by mouth daily. Marland Kitchen  lisinopril (PRINIVIL,ZESTRIL) 5 MG tablet, Take 10 mg by mouth daily.  .  metoprolol succinate (TOPROL-XL) 25  MG 24 hr tablet, take 1 tablet by mouth once daily  Current Outpatient Medications (Respiratory):  .  loratadine (CLARITIN) 10 MG tablet, Take 10 mg by mouth daily.  Current Outpatient Medications (Analgesics):  .  aspirin 81 MG EC tablet, Take 81 mg by mouth daily.   Marland Kitchen  ibuprofen (ADVIL,MOTRIN) 200 MG tablet, Take 400 mg by mouth every 6 (six) hours as needed for mild pain.  .  naproxen (NAPROSYN) 500 MG tablet, Take 500 mg by mouth 2 (two) times daily with a meal.  Current Outpatient Medications (Hematological):  Marland Kitchen  Cyanocobalamin 2500 MCG CHEW, Chew 1 tablet by mouth daily.  Current Outpatient Medications (Other):  Marland Kitchen  Calcium Carbonate-Vitamin D (CALCIUM PLUS VITAMIN D) 500-50 MG-UNIT CAPS, Take 1 tablet by mouth daily.  .  Cinnamon 500 MG TABS, Take 500 mg by mouth daily.  Marland Kitchen  gabapentin (NEURONTIN) 300 MG capsule, Take 300 mg by mouth at bedtime. .  Misc Natural Products (BLACK CHERRY CONCENTRATE PO), Take 1 tablet by mouth daily. .  traZODone (DESYREL) 50 MG tablet, Take 0.5-1 tablets (25-50 mg total) by mouth at bedtime as needed for sleep. .  TURMERIC PO, Take 1 capsule by mouth daily.    Past medical history, social, surgical and family history all reviewed in electronic medical record.  No pertanent information unless stated regarding to the chief complaint.   Review of Systems:  No headache, visual changes, nausea, vomiting, diarrhea, constipation, dizziness, abdominal pain, skin rash, fevers, chills, night sweats, weight loss, swollen lymph nodes, body aches, joint swelling, muscle aches, chest pain, shortness of breath, mood changes.  Positive muscle aches  Objective  Blood pressure 128/84, pulse 64, height 5\' 8"  (1.727 m), weight 206 lb (93.4 kg), SpO2 97 %.    General: No apparent distress alert and oriented x3 mood and affect normal, dressed appropriately.  HEENT: Pupils equal, extraocular movements intact  Respiratory: Patient's speak in full sentences and does not  appear short of breath  Cardiovascular: No lower extremity edema, non tender, no erythema  Skin: Warm dry intact with no signs of infection or rash on extremities or on axial skeleton.  Abdomen: Soft nontender  Neuro: Cranial nerves II through XII are intact, neurovascularly intact in all extremities with 2+ DTRs and 2+ pulses.  Lymph: No lymphadenopathy of posterior or anterior cervical chain or axillae bilaterally.  Gait normal with good balance and coordination.  MSK:  Non tender with full range of motion and good stability and symmetric strength and tone of shoulders, elbows, wrist, hip, knee and ankles bilaterally.  Back Exam:  Inspection: There is loss of lordosis and mild scoliosis Motion: Flexion 32 deg, Extension 20  deg, Side Bending to 35 deg bilaterally,  Rotation to 45 deg bilaterally  SLR laying: Positive left XSLR laying: Negative  Palpable tenderness: Tender to palpation in paraspinal musculature lumbar spine. FABER: Tightness of Faber. Sensory change: Gross sensation intact to all lumbar and sacral dermatomes.  Reflexes: 2+ at both patellar tendons, 2+ at achilles tendons, Babinski's downgoing.  Strength at foot  Patient does have weakness with plantarflexion and dorsiflexion of 4-5 on the left side.    Impression and Recommendations:     This case required medical decision making of moderate complexity. The above documentation has been reviewed and is accurate and complete Lyndal Pulley, DO       Note: This dictation was prepared with Dragon dictation along with smaller phrase technology. Any transcriptional errors that result from this process are unintentional.

## 2018-04-20 ENCOUNTER — Ambulatory Visit (INDEPENDENT_AMBULATORY_CARE_PROVIDER_SITE_OTHER): Payer: Medicare HMO | Admitting: Family Medicine

## 2018-04-20 ENCOUNTER — Encounter: Payer: Self-pay | Admitting: Family Medicine

## 2018-04-20 DIAGNOSIS — M5416 Radiculopathy, lumbar region: Secondary | ICD-10-CM

## 2018-04-20 NOTE — Patient Instructions (Signed)
Good to see you  Try the horizant 600mg  at night and try it for a week or 2  If better I can prescribe.  Keep being active and try to do the home exercises  Stay active Heel lift in the toes See me again in 3-4 weeks

## 2018-04-20 NOTE — Assessment & Plan Note (Signed)
Patient's back pain seems to be better but continues to have the radicular symptoms and does have some weakness.  Discussed with patient about home exercises, increased to long-acting gabapentin to see how patient responds.  Discussed topical anti-inflammatories.  Discussed the possibility of repeating the injections or even see if patient wanted a referral for radiofrequency ablation which patient declined at the moment.  Patient will continue with the home exercises and we will have patient follow-up with me again in 4 weeks spent  25 minutes with patient face-to-face and had greater than 50% of counseling including as described above in assessment and plan.

## 2018-05-10 ENCOUNTER — Encounter: Payer: Self-pay | Admitting: *Deleted

## 2018-05-11 ENCOUNTER — Ambulatory Visit: Payer: Medicare HMO | Admitting: Family Medicine

## 2018-07-26 DIAGNOSIS — R42 Dizziness and giddiness: Secondary | ICD-10-CM

## 2018-07-26 DIAGNOSIS — I639 Cerebral infarction, unspecified: Secondary | ICD-10-CM

## 2018-07-26 DIAGNOSIS — I69398 Other sequelae of cerebral infarction: Secondary | ICD-10-CM

## 2018-07-26 HISTORY — DX: Other sequelae of cerebral infarction: I69.398

## 2018-07-26 HISTORY — DX: Cerebral infarction, unspecified: I63.9

## 2018-07-26 HISTORY — DX: Dizziness and giddiness: R42

## 2018-08-03 ENCOUNTER — Inpatient Hospital Stay (HOSPITAL_COMMUNITY)
Admission: EM | Admit: 2018-08-03 | Discharge: 2018-08-05 | DRG: 065 | Disposition: A | Discharge status: Home Health | Payer: Medicare HMO | Attending: Family Medicine | Admitting: Family Medicine

## 2018-08-03 ENCOUNTER — Emergency Department (HOSPITAL_COMMUNITY): Payer: Medicare HMO

## 2018-08-03 ENCOUNTER — Other Ambulatory Visit: Payer: Self-pay

## 2018-08-03 ENCOUNTER — Encounter (HOSPITAL_COMMUNITY): Payer: Self-pay

## 2018-08-03 DIAGNOSIS — E1151 Type 2 diabetes mellitus with diabetic peripheral angiopathy without gangrene: Secondary | ICD-10-CM | POA: Diagnosis present

## 2018-08-03 DIAGNOSIS — E7800 Pure hypercholesterolemia, unspecified: Secondary | ICD-10-CM | POA: Diagnosis present

## 2018-08-03 DIAGNOSIS — Z7984 Long term (current) use of oral hypoglycemic drugs: Secondary | ICD-10-CM | POA: Diagnosis not present

## 2018-08-03 DIAGNOSIS — E669 Obesity, unspecified: Secondary | ICD-10-CM | POA: Diagnosis present

## 2018-08-03 DIAGNOSIS — Z820 Family history of epilepsy and other diseases of the nervous system: Secondary | ICD-10-CM | POA: Diagnosis not present

## 2018-08-03 DIAGNOSIS — Z20828 Contact with and (suspected) exposure to other viral communicable diseases: Secondary | ICD-10-CM | POA: Diagnosis not present

## 2018-08-03 DIAGNOSIS — Z880 Allergy status to penicillin: Secondary | ICD-10-CM | POA: Diagnosis not present

## 2018-08-03 DIAGNOSIS — I440 Atrioventricular block, first degree: Secondary | ICD-10-CM | POA: Diagnosis present

## 2018-08-03 DIAGNOSIS — Z6831 Body mass index (BMI) 31.0-31.9, adult: Secondary | ICD-10-CM

## 2018-08-03 DIAGNOSIS — E1169 Type 2 diabetes mellitus with other specified complication: Secondary | ICD-10-CM

## 2018-08-03 DIAGNOSIS — Z8249 Family history of ischemic heart disease and other diseases of the circulatory system: Secondary | ICD-10-CM | POA: Diagnosis not present

## 2018-08-03 DIAGNOSIS — Z7982 Long term (current) use of aspirin: Secondary | ICD-10-CM

## 2018-08-03 DIAGNOSIS — I447 Left bundle-branch block, unspecified: Secondary | ICD-10-CM | POA: Diagnosis present

## 2018-08-03 DIAGNOSIS — R29701 NIHSS score 1: Secondary | ICD-10-CM | POA: Diagnosis present

## 2018-08-03 DIAGNOSIS — N179 Acute kidney failure, unspecified: Secondary | ICD-10-CM | POA: Diagnosis not present

## 2018-08-03 DIAGNOSIS — E119 Type 2 diabetes mellitus without complications: Secondary | ICD-10-CM

## 2018-08-03 DIAGNOSIS — R51 Headache: Secondary | ICD-10-CM | POA: Diagnosis not present

## 2018-08-03 DIAGNOSIS — Z823 Family history of stroke: Secondary | ICD-10-CM | POA: Diagnosis not present

## 2018-08-03 DIAGNOSIS — R269 Unspecified abnormalities of gait and mobility: Secondary | ICD-10-CM | POA: Diagnosis not present

## 2018-08-03 DIAGNOSIS — E785 Hyperlipidemia, unspecified: Secondary | ICD-10-CM | POA: Diagnosis not present

## 2018-08-03 DIAGNOSIS — I639 Cerebral infarction, unspecified: Secondary | ICD-10-CM

## 2018-08-03 DIAGNOSIS — G464 Cerebellar stroke syndrome: Secondary | ICD-10-CM | POA: Diagnosis not present

## 2018-08-03 DIAGNOSIS — I10 Essential (primary) hypertension: Secondary | ICD-10-CM | POA: Diagnosis present

## 2018-08-03 DIAGNOSIS — I6381 Other cerebral infarction due to occlusion or stenosis of small artery: Principal | ICD-10-CM | POA: Diagnosis present

## 2018-08-03 DIAGNOSIS — R42 Dizziness and giddiness: Secondary | ICD-10-CM | POA: Diagnosis not present

## 2018-08-03 DIAGNOSIS — I480 Paroxysmal atrial fibrillation: Secondary | ICD-10-CM | POA: Diagnosis not present

## 2018-08-03 DIAGNOSIS — I6523 Occlusion and stenosis of bilateral carotid arteries: Secondary | ICD-10-CM | POA: Diagnosis not present

## 2018-08-03 DIAGNOSIS — R270 Ataxia, unspecified: Secondary | ICD-10-CM | POA: Diagnosis not present

## 2018-08-03 LAB — DIFFERENTIAL
Abs Immature Granulocytes: 0.01 10*3/uL (ref 0.00–0.07)
Basophils Absolute: 0 10*3/uL (ref 0.0–0.1)
Basophils Relative: 1 %
Eosinophils Absolute: 0.4 10*3/uL (ref 0.0–0.5)
Eosinophils Relative: 6 %
Immature Granulocytes: 0 %
Lymphocytes Relative: 30 %
Lymphs Abs: 1.7 10*3/uL (ref 0.7–4.0)
Monocytes Absolute: 0.4 10*3/uL (ref 0.1–1.0)
Monocytes Relative: 8 %
Neutro Abs: 3.1 10*3/uL (ref 1.7–7.7)
Neutrophils Relative %: 55 %

## 2018-08-03 LAB — URINALYSIS, ROUTINE W REFLEX MICROSCOPIC
Bilirubin Urine: NEGATIVE
Glucose, UA: NEGATIVE mg/dL
Hgb urine dipstick: NEGATIVE
Ketones, ur: NEGATIVE mg/dL
Leukocytes,Ua: NEGATIVE
Nitrite: NEGATIVE
Protein, ur: NEGATIVE mg/dL
Specific Gravity, Urine: 1.014 (ref 1.005–1.030)
pH: 6 (ref 5.0–8.0)

## 2018-08-03 LAB — RAPID URINE DRUG SCREEN, HOSP PERFORMED
Amphetamines: NOT DETECTED
Barbiturates: NOT DETECTED
Benzodiazepines: NOT DETECTED
Cocaine: NOT DETECTED
Opiates: NOT DETECTED
Tetrahydrocannabinol: NOT DETECTED

## 2018-08-03 LAB — CBC
HCT: 42.2 % (ref 36.0–46.0)
Hemoglobin: 13.8 g/dL (ref 12.0–15.0)
MCH: 32.2 pg (ref 26.0–34.0)
MCHC: 32.7 g/dL (ref 30.0–36.0)
MCV: 98.4 fL (ref 80.0–100.0)
Platelets: 159 10*3/uL (ref 150–400)
RBC: 4.29 MIL/uL (ref 3.87–5.11)
RDW: 12.3 % (ref 11.5–15.5)
WBC: 5.7 10*3/uL (ref 4.0–10.5)
nRBC: 0 % (ref 0.0–0.2)

## 2018-08-03 LAB — GLUCOSE, CAPILLARY: Glucose-Capillary: 101 mg/dL — ABNORMAL HIGH (ref 70–99)

## 2018-08-03 LAB — BASIC METABOLIC PANEL
Anion gap: 12 (ref 5–15)
BUN: 23 mg/dL (ref 8–23)
CO2: 24 mmol/L (ref 22–32)
Calcium: 9.1 mg/dL (ref 8.9–10.3)
Chloride: 105 mmol/L (ref 98–111)
Creatinine, Ser: 0.79 mg/dL (ref 0.44–1.00)
GFR calc Af Amer: >60 mL/min (ref >60)
GFR calc non Af Amer: >60 mL/min (ref >60)
Glucose, Bld: 118 mg/dL — ABNORMAL HIGH (ref 70–99)
Potassium: 3.7 mmol/L (ref 3.5–5.1)
Sodium: 141 mmol/L (ref 135–145)

## 2018-08-03 LAB — CBG MONITORING, ED: Glucose-Capillary: 158 mg/dL — ABNORMAL HIGH (ref 70–99)

## 2018-08-03 LAB — APTT: aPTT: 30 seconds (ref 24–36)

## 2018-08-03 LAB — PROTIME-INR
INR: 1 (ref 0.8–1.2)
Prothrombin Time: 13.4 seconds (ref 11.4–15.2)

## 2018-08-03 LAB — HEMOGLOBIN A1C
Hgb A1c MFr Bld: 5.7 % — ABNORMAL HIGH (ref 4.8–5.6)
Mean Plasma Glucose: 116.89 mg/dL

## 2018-08-03 LAB — TSH: TSH: 1.424 u[IU]/mL (ref 0.350–4.500)

## 2018-08-03 MED ORDER — VITAMIN B-12 1000 MCG PO TABS
500.0000 ug | ORAL_TABLET | Freq: Every morning | ORAL | Status: DC
  Administered 2018-08-04 – 2018-08-05 (×2): 500 ug via ORAL
  Filled 2018-08-03 (×2): qty 1
  Filled 2018-08-03: qty 0.5

## 2018-08-03 MED ORDER — ONDANSETRON HCL 4 MG/2ML IJ SOLN: 4.0000 mg | Freq: Four times a day (QID) | INTRAMUSCULAR | Status: DC | PRN

## 2018-08-03 MED ORDER — ACETAMINOPHEN 650 MG RE SUPP: 650.0000 mg | Freq: Four times a day (QID) | RECTAL | Status: DC | PRN

## 2018-08-03 MED ORDER — TIZANIDINE HCL 4 MG PO TABS
4.0000 mg | ORAL_TABLET | Freq: Every day | ORAL | Status: DC
  Administered 2018-08-03 – 2018-08-04 (×2): 4 mg via ORAL
  Filled 2018-08-03 (×3): qty 1

## 2018-08-03 MED ORDER — METOPROLOL TARTRATE 25 MG PO TABS
25.0000 mg | ORAL_TABLET | Freq: Two times a day (BID) | ORAL | Status: DC
  Administered 2018-08-03: 25 mg via ORAL
  Filled 2018-08-03: qty 1

## 2018-08-03 MED ORDER — INSULIN ASPART 100 UNIT/ML ~~LOC~~ SOLN: 0.0000 [IU] | Freq: Three times a day (TID) | SUBCUTANEOUS | Status: DC

## 2018-08-03 MED ORDER — CALCIUM CARBONATE-VITAMIN D 500-50 MG-UNIT PO CAPS: 1.0000 | ORAL_CAPSULE | Freq: Every evening | ORAL | Status: DC

## 2018-08-03 MED ORDER — SODIUM CHLORIDE 0.9% FLUSH: 3.0000 mL | Freq: Once | INTRAVENOUS | Status: DC

## 2018-08-03 MED ORDER — LORAZEPAM 2 MG/ML IJ SOLN
0.5000 mg | Freq: Once | INTRAMUSCULAR | Status: AC
  Administered 2018-08-03: 0.5 mg via INTRAVENOUS
  Filled 2018-08-03: qty 1

## 2018-08-03 MED ORDER — INSULIN ASPART 100 UNIT/ML ~~LOC~~ SOLN: 0.0000 [IU] | Freq: Every day | SUBCUTANEOUS | Status: DC

## 2018-08-03 MED ORDER — ONDANSETRON HCL 4 MG/2ML IJ SOLN
4.0000 mg | Freq: Once | INTRAMUSCULAR | Status: AC
  Administered 2018-08-03: 4 mg via INTRAVENOUS
  Filled 2018-08-03: qty 2

## 2018-08-03 MED ORDER — SODIUM CHLORIDE 0.9% FLUSH: 3.0000 mL | INTRAVENOUS | Status: DC | PRN

## 2018-08-03 MED ORDER — ATORVASTATIN CALCIUM 40 MG PO TABS
80.0000 mg | ORAL_TABLET | Freq: Every evening | ORAL | Status: DC
  Administered 2018-08-03 – 2018-08-04 (×2): 80 mg via ORAL
  Filled 2018-08-03 (×2): qty 2

## 2018-08-03 MED ORDER — CALCIUM CARBONATE-VITAMIN D 500-200 MG-UNIT PO TABS
1.0000 | ORAL_TABLET | Freq: Every evening | ORAL | Status: DC
  Administered 2018-08-03 – 2018-08-04 (×2): 1 via ORAL
  Filled 2018-08-03 (×3): qty 1

## 2018-08-03 MED ORDER — TRAZODONE HCL 50 MG PO TABS: 50.0000 mg | ORAL_TABLET | Freq: Every evening | ORAL | Status: DC | PRN

## 2018-08-03 MED ORDER — CLOPIDOGREL BISULFATE 75 MG PO TABS
75.0000 mg | ORAL_TABLET | Freq: Every day | ORAL | Status: DC
  Administered 2018-08-03 – 2018-08-05 (×3): 75 mg via ORAL
  Filled 2018-08-03 (×3): qty 1

## 2018-08-03 MED ORDER — GABAPENTIN 300 MG PO CAPS
600.0000 mg | ORAL_CAPSULE | Freq: Two times a day (BID) | ORAL | Status: DC
  Administered 2018-08-03 – 2018-08-05 (×4): 600 mg via ORAL
  Filled 2018-08-03 (×4): qty 2

## 2018-08-03 MED ORDER — LORATADINE 10 MG PO TABS
10.0000 mg | ORAL_TABLET | Freq: Every day | ORAL | Status: DC
  Administered 2018-08-04 – 2018-08-05 (×2): 10 mg via ORAL
  Filled 2018-08-03 (×2): qty 1

## 2018-08-03 MED ORDER — SODIUM CHLORIDE 0.9% FLUSH
3.0000 mL | Freq: Two times a day (BID) | INTRAVENOUS | Status: DC
  Administered 2018-08-03 – 2018-08-04 (×2): 3 mL via INTRAVENOUS

## 2018-08-03 MED ORDER — HYDRALAZINE HCL 20 MG/ML IJ SOLN: 10.0000 mg | Freq: Four times a day (QID) | INTRAMUSCULAR | Status: DC | PRN

## 2018-08-03 MED ORDER — POLYETHYLENE GLYCOL 3350 17 G PO PACK: 17.0000 g | PACK | Freq: Every day | ORAL | Status: DC | PRN

## 2018-08-03 MED ORDER — ONDANSETRON HCL 4 MG PO TABS: 4.0000 mg | ORAL_TABLET | Freq: Four times a day (QID) | ORAL | Status: DC | PRN

## 2018-08-03 MED ORDER — ALBUTEROL SULFATE (2.5 MG/3ML) 0.083% IN NEBU: 2.5000 mg | INHALATION_SOLUTION | RESPIRATORY_TRACT | Status: DC | PRN

## 2018-08-03 MED ORDER — HYDRALAZINE HCL 20 MG/ML IJ SOLN
10.0000 mg | Freq: Four times a day (QID) | INTRAMUSCULAR | Status: DC | PRN
  Administered 2018-08-03: 16:00:00 10 mg via INTRAVENOUS
  Filled 2018-08-03: qty 1

## 2018-08-03 MED ORDER — ACETAMINOPHEN 325 MG PO TABS: 650.0000 mg | ORAL_TABLET | Freq: Four times a day (QID) | ORAL | Status: DC | PRN

## 2018-08-03 MED ORDER — HEPARIN SODIUM (PORCINE) 5000 UNIT/ML IJ SOLN
5000.0000 [IU] | Freq: Three times a day (TID) | INTRAMUSCULAR | Status: DC
  Administered 2018-08-03 – 2018-08-05 (×5): 5000 [IU] via SUBCUTANEOUS
  Filled 2018-08-03 (×5): qty 1

## 2018-08-03 MED ORDER — SODIUM CHLORIDE 0.9 % IV SOLN: 250.0000 mL | INTRAVENOUS | Status: DC | PRN

## 2018-08-03 MED ORDER — ASPIRIN 81 MG PO CHEW
81.0000 mg | CHEWABLE_TABLET | Freq: Every day | ORAL | Status: DC
  Administered 2018-08-04 – 2018-08-05 (×2): 81 mg via ORAL
  Filled 2018-08-03 (×2): qty 1

## 2018-08-03 NOTE — Consult Note (Signed)
[facility] Consult Services   Date of Service:   08/03/2018 15:39:52  Impression:     .  cerebellar stroke  Comments/Sign-Out: Patient with history of hypertension, Hyperlipidemia/Hypercholesterolemia Presents with dizziness Brain MRI showed 3 x 5 mm acute RIGHT superior cerebellar infarct MRA brain Poorly visualized RIGHT superior cerebellar artery could be occluded. NIHSS 1 (sensory asymmetry in lower leg) likely not due to Acute Ischemic Stroke. Symptoms/presentation due to right superior cerebellar infarct Last time known well>24 hours therefore not a candidate for IV tPA or Endovascular treatment.  Mechanism of Stroke: Small Vessel Disease  Metrics: Last Known Well: 08/01/2018 23:00:00 [facility] Notification Time: 08/03/2018 15:39:08 Arrival Time: 08/03/2018 11:48:00 Stamp Time: 08/03/2018 15:39:52 Time First Login Attempt: 08/03/2018 15:49:05 Video Start Time: 08/03/2018 15:49:05  Symptoms: dizziness NIHSS Start Assessment Time: 08/03/2018 15:53:00 Patient is not a candidate for tPA. Patient was not deemed candidate for tPA thrombolytics because of Last Well Known Above 4.5 Hours. Video End Time: 08/03/2018 16:01:55  CT head showed no acute hemorrhage or acute core infarct. CT head was reviewed.  Clinical Presentation is not Suggestive of Large Vessel Occlusive Disease  Radiologist was not called back for review of advanced imaging because MRA already done and report available ED Physician notified of diagnostic impression and management plan on 08/03/2018 16:06:52  Our recommendations are outlined below.  Recommendations:     .  Activate Stroke Protocol Admission/Order Set     .  Stroke/Telemetry Floor     .  Neuro Checks     .  Bedside Swallow Eval     .  DVT Prophylaxis     .  IV Fluids, Normal Saline     .  Head of Bed 30 Degrees     .  Euglycemia and Avoid Hyperthermia (PRN Acetaminophen)     .  continue aspirin (home  medication),     .  start plavix (loading dose today and maintenance dose from tomorrow), continue dual Antiplatelet therapy for 21 days and then stop aspirin and continue with plavix monotherapy.     Marland Kitchen  Physical Therapy  Routine Consultation with [facility] for Follow up Care  Sign Out:     .  Discussed with Emergency Department Provider    ------------------------------------------------------------------------------  History of Present Illness: Patient is a 73 year old Female.  Patient was brought by private transportation with symptoms of dizziness  Patient with history of hypertension, Hyperlipidemia/Hypercholesterolemia last known well per patient: 08/01/2018 23:00 woke up yesterday morning with dizziness. Anticoagulation or Antiplatelet use: aspirin Premorbid Level of function: Independent, Normal cognition and gait. Patient with history of hypertension, Hyperlipidemia/Hypercholesterolemia last known well per patient: 08/01/2018 23:00 woke up yesterday morning with dizziness. Anticoagulation or Antiplatelet use: aspirin Premorbid Level of function: Independent, Normal cognition and gait Brain MRI showed 3 x 5 mm acute RIGHT superior cerebellar infarct MRA brain Poorly visualized RIGHT superior cerebellar artery could be occluded.   Examination: 1A: Level of Consciousness - Alert; keenly responsive + 0 1B: Ask Month and Age - Both Questions Right + 0 1C: Blink Eyes & Squeeze Hands - Performs Both Tasks + 0 2: Test Horizontal Extraocular Movements - Normal + 0 3: Test Visual Fields - No Visual Loss + 0 4: Test Facial Palsy (Use Grimace if Obtunded) - Normal symmetry + 0 5A: Test Left Arm Motor Drift - No Drift for 10 Seconds + 0 5B: Test Right Arm Motor Drift - No Drift for 10 Seconds + 0 6A: Test  Left Leg Motor Drift - No Drift for 5 Seconds + 0 6B: Test Right Leg Motor Drift - No Drift for 5 Seconds + 0 7: Test Limb Ataxia (FNF/Heel-Shin) - No Ataxia + 0 8: Test Sensation -  Mild-Moderate Loss: Less Sharp/More Dull + 1 9: Test Language/Aphasia - Normal; No aphasia + 0 10: Test Dysarthria - Normal + 0 11: Test Extinction/Inattention - No abnormality + 0  NIHSS Score: 1  Patient/Family was informed the Neurology Consult would happen via TeleHealth consult by way of interactive audio and video telecommunications and consented to receiving care in this manner.  Due to the immediate potential for life-threatening deterioration due to underlying acute neurologic illness, I spent 12 minutes providing critical care. This time includes time for face to face visit via telemedicine, review of medical records, imaging studies and discussion of findings with providers, the patient and/or family.   Dr Elenor Quinones   [facility] [phone]   Case [id number]

## 2018-08-03 NOTE — ED Notes (Signed)
Pt ambulatory to bathroom and back to room 

## 2018-08-03 NOTE — ED Provider Notes (Signed)
[facility] EMERGENCY DEPARTMENT Provider Note   CSN: [id number] Arrival date & time: 08/03/18  1048    History   Chief Complaint Chief Complaint  Patient presents with   Dizziness    HPI Sabrina Taylor is a 73 y.o. female with history of hypertension, hyperlipidemia, left bundle branch block, obesity, paroxysmal A. Fib presents for evaluation of acute onset, persistent dizziness for 2 days.  She reports that yesterday morning she awoke with a room spinning sensation which waxes and wanes but is persistent and occurs at rest.  She also feels unsteady when she ambulates and at times feels lightheaded like she might pass out.  She notes that she has been hypertensive during this time as well with a blood pressure of 204/110 at home.  She took an extra dose of her lisinopril last night without improvement in her symptoms.  She notes a mild left-sided frontal headache which she attributes to her seasonal allergies.  Denies difficulty swallowing, shortness of breath, chest pain, abdominal pain, vomiting, numbness or weakness of the extremities.  Does note some nausea.  She does have paresthesias of her feet which are chronic and unchanged for which she takes Neurontin.     The history is provided by the patient.    Past Medical History:  Diagnosis Date   DDD (degenerative disc disease)    Low back pain   First degree AV block    Hyperlipidemia    Hypertension    LBBB (left bundle branch block)    Obesity    PAF (paroxysmal atrial fibrillation) (HCC)    PONV (postoperative nausea and vomiting)     Patient Active Problem List   Diagnosis Date Noted   CVA (cerebral vascular accident) ([place]) 08/03/2018   Lumbar radiculopathy 06/06/2016   Greater trochanteric bursitis of left hip 02/19/2016   Injury of left rotator cuff 01/30/2014   Piriformis syndrome of right side 06/21/2013   HYPERLIPIDEMIA 02/28/2010   OBESITY 02/28/2010   AV BLOCK, 1ST DEGREE 02/28/2010   LEFT  BUNDLE BRANCH BLOCK 02/28/2010   BACK PAIN, LUMBAR 02/28/2010    Past Surgical History:  Procedure Laterality Date   APPENDECTOMY  1960   BACK SURGERY  may 2011   CATARACT EXTRACTION W/PHACO Right 06/05/2014   Procedure: CATARACT EXTRACTION PHACO AND INTRAOCULAR LENS PLACEMENT ([place]);  Surgeon: Tonny Branch, MD;  Location: [facility];  Service: Ophthalmology;  Laterality: Right;  CDE: 6.42   CATARACT EXTRACTION W/PHACO Left 06/19/2014   Procedure: CATARACT EXTRACTION PHACO AND INTRAOCULAR LENS PLACEMENT ([facility]);  Surgeon: Tonny Branch, MD;  Location: [facility];  Service: Ophthalmology;  Laterality: Left;  CDE 5.61   CHOLECYSTECTOMY     COLONOSCOPY  03/25/2011   Procedure: COLONOSCOPY;  Surgeon: Jamesetta So, MD;  Location: [facility] SUITE;  Service: Gastroenterology;  Laterality: N/A;   KNEE ARTHROSCOPY  2004, 2008   Right   TOTAL ABDOMINAL HYSTERECTOMY       OB History   No obstetric history on file.      Home Medications    Prior to Admission medications   Medication Sig Start Date End Date Taking? Authorizing Provider  aspirin 81 MG EC tablet Take 81 mg by mouth daily.     Yes [provider]  atorvastatin (LIPITOR) 20 MG tablet take 1 tablet by mouth once daily Patient taking differently: Take 20 mg by mouth every evening.  01/17/11  Yes Rothbart, Cristopher Estimable, MD  Calcium Carbonate-Vitamin D (CALCIUM PLUS VITAMIN D) 500-50 MG-UNIT  CAPS Take 1 tablet by mouth every evening.    Yes [provider]  Cinnamon 500 MG TABS Take 500 mg by mouth every evening.    Yes [provider]  gabapentin (NEURONTIN) 300 MG capsule Take 600 mg by mouth 2 (two) times daily.  12/19/13  Yes [provider]  Ginger, Zingiber officinalis, (GINGER ROOT PO) Take 1 tablet by mouth every evening.   Yes [provider]  ibuprofen (ADVIL,MOTRIN) 200 MG tablet Take 400 mg by mouth every 6 (six) hours as needed for mild pain.    Yes [provider]  lisinopril  (ZESTRIL) 10 MG tablet Take 10 mg by mouth daily.    Yes [provider]  loratadine (CLARITIN) 10 MG tablet Take 10 mg by mouth daily.   Yes [provider]  Magnesium Chloride (MAGNESIUM DR PO) Take 1 tablet by mouth every evening.   Yes [provider]  metFORMIN (GLUCOPHAGE-XR) 500 MG 24 hr tablet Take 500 mg by mouth every evening.  05/19/18  Yes [provider]  metoprolol tartrate (LOPRESSOR) 25 MG tablet Take 25 mg by mouth every morning. 05/19/18  Yes [provider]  Misc Natural Products (BLACK CHERRY CONCENTRATE PO) Take 1 tablet by mouth every evening.    Yes [provider]  tiZANidine (ZANAFLEX) 4 MG tablet Take 4 mg by mouth at bedtime. 07/24/18  Yes [provider]  TURMERIC PO Take 1 capsule by mouth every evening.    Yes [provider]  vitamin B-12 (CYANOCOBALAMIN) 500 MCG tablet Take 1 tablet by mouth every morning.    Yes [provider]    Family History Family History  Problem Relation Age of Onset   Hypertension Mother    Alzheimer's disease Mother    Stroke Father    Coronary artery disease Brother     Social History Social History   Tobacco Use   Smoking status: Never Smoker   Smokeless tobacco: Never Used  Substance Use Topics   Alcohol use: No   Drug use: No     Allergies   Penicillins   Review of Systems Review of Systems  Constitutional: Negative for chills and fever.  Respiratory: Negative for cough and shortness of breath.   Cardiovascular: Negative for chest pain.  Gastrointestinal: Positive for nausea. Negative for abdominal pain, diarrhea and vomiting.  Neurological: Positive for dizziness, numbness (chronic, unchanged) and headaches. Negative for weakness.  All other systems reviewed and are negative.    Physical Exam Updated Vital Signs BP (!) 192/85    Pulse 64    Temp 98.1 F (36.7 C)    Resp 14    Ht 5\' 8"  (1.727 m)    Wt 95.3 kg    SpO2 98%     BMI 31.93 kg/m   Physical Exam Vitals signs and nursing note reviewed.  Constitutional:      General: She is not in acute distress.    Appearance: She is well-developed.  HENT:     Head: Normocephalic and atraumatic.  Eyes:     General:        Right eye: No discharge.        Left eye: No discharge.     Extraocular Movements: Extraocular movements intact.     Conjunctiva/sclera: Conjunctivae normal.     Pupils: Pupils are equal, round, and reactive to light.  Neck:     Musculoskeletal: Normal range of motion and neck supple.     Vascular: No  JVD.     Trachea: No tracheal deviation.  Cardiovascular:     Rate and Rhythm: Normal rate and regular rhythm.     Pulses: Normal pulses.     Comments: Trace pitting edema of the bilateral lower extremities, right worse than left Pulmonary:     Effort: Pulmonary effort is normal.     Breath sounds: Normal breath sounds.  Abdominal:     General: Bowel sounds are normal.     Palpations: Abdomen is soft.     Tenderness: There is no abdominal tenderness. There is no guarding or rebound.  Skin:    General: Skin is warm and dry.     Findings: No erythema.  Neurological:     Mental Status: She is alert and oriented to person, place, and time.     Coordination: Coordination abnormal.     Comments: Mental Status:  Alert, thought content appropriate, able to give a coherent history. Speech fluent without evidence of aphasia. Able to follow 2 step commands without difficulty.  Cranial Nerves:  II:  Peripheral visual fields grossly normal, pupils equal, round, reactive to light III,IV, VI: ptosis not present, extra-ocular motions intact bilaterally  V,VII: smile symmetric, facial light touch sensation equal VIII: hearing grossly normal to voice  X: uvula elevates symmetrically  XI: bilateral shoulder shrug symmetric and strong XII: midline tongue extension without fassiculations Motor:  Normal tone. 5/5 strength of BUE and BLE major muscle  groups including strong and equal grip strength and dorsiflexion/plantar flexion Sensory: light touch normal in all extremities. Cerebellar: normal finger-to-nose with bilateral upper extremities, Romberg sign positive Gait: ambulates with unsteady gait, somewhat unbalanced and requires assistance.  Unable to heel walk and toe walk without assistance.  Psychiatric:        Behavior: Behavior normal.      ED Treatments / Results  Labs (all labs ordered are listed, but only abnormal results are displayed) Labs Reviewed  BASIC METABOLIC PANEL - Abnormal; Notable for the following components:      Result Value   Glucose, Bld 118 (*)    All other components within normal limits  URINALYSIS, ROUTINE W REFLEX MICROSCOPIC - Abnormal; Notable for the following components:   APPearance HAZY (*)    All other components within normal limits  CBG MONITORING, ED - Abnormal; Notable for the following components:   Glucose-Capillary 158 (*)    All other components within normal limits  NOVEL CORONAVIRUS, NAA (HOSPITAL ORDER, SEND-OUT TO REF LAB)  CBC  PROTIME-INR  APTT  DIFFERENTIAL  RAPID URINE DRUG SCREEN, HOSP PERFORMED  LIPID PANEL  BASIC METABOLIC PANEL  CBC  HEMOGLOBIN A1C  TSH    EKG None  Radiology Ct Head Wo Contrast  Result Date: 08/03/2018 CLINICAL DATA:  Dizziness. EXAM: CT HEAD WITHOUT CONTRAST TECHNIQUE: Contiguous axial images were obtained from the base of the skull through the vertex without intravenous contrast. COMPARISON:  None. FINDINGS: Brain: No evidence of acute infarction, hemorrhage, hydrocephalus, extra-axial collection or mass lesion/mass effect. Vascular: Atherosclerotic vascular calcification of the carotid siphons. No hyperdense vessel. Skull: Negative for fracture or focal lesion. Sinuses/Orbits: No acute finding. Other: None. IMPRESSION: 1.  No acute intracranial abnormality. Electronically Signed   By: Titus Dubin M.D.   On: 08/03/2018 13:37   Mr Jodene Nam  Head Wo Contrast  Result Date: 08/03/2018 CLINICAL DATA:  Dizziness since yesterday. History of hypertension, atrial fibrillation, and cardiac arrhythmia. EXAM: MRI HEAD WITHOUT CONTRAST MRA HEAD WITHOUT CONTRAST TECHNIQUE: Multiplanar, multiecho pulse  sequences of the brain and surrounding structures were obtained without intravenous contrast. Angiographic images of the head were obtained using MRA technique without contrast. COMPARISON:  CT head earlier today was unremarkable. FINDINGS: MRI HEAD FINDINGS Brain: Small focus of restricted diffusion, corresponding low ADC, RIGHT superior cerebellum, confirmed on axial and coronal DWI, consistent with acute infarct. See series 3, image 70. Elsewhere, no acute stroke, hemorrhage, mass lesion, hydrocephalus, or extra-axial fluid. Paragraph mild cerebral and cerebellar atrophy. Mild subcortical and periventricular T2 and FLAIR hyperintensities, likely chronic microvascular ischemic change. Chronic lacunar infarcts affect the brainstem most notably in the RIGHT paramedian pons, as well as punctate areas of chronic ischemia the elsewhere in the RIGHT cerebellum. Vascular: Reported separately. Skull and upper cervical spine: No acute findings. Bifrontal hyperostosis. Sinuses/Orbits: No significant paranasal sinus disease. BILATERAL cataract extraction, otherwise unremarkable orbits. Other: None. MRA HEAD FINDINGS The internal carotid arteries are widely patent. The basilar artery is widely patent with vertebrals both contributing, LEFT slightly larger. No proximal stenosis of the anterior or middle cerebral arteries. No intracranial saccular aneurysm. RIGHT superior cerebellar artery poorly visualized. Moderately diseased LEFT PICA. Poorly visualized LEFT AICA. LEFT SCA patent. Focal 75% stenosis of the proximal RIGHT posterior cerebral artery P1 segment. IMPRESSION: 3 x 5 mm acute RIGHT superior cerebellar infarct. Poorly visualized RIGHT superior cerebellar artery could  be occluded. Elsewhere, normal for age cerebral volume with mild small vessel disease. No proximal large vessel stenosis or occlusion. Electronically Signed   By: Staci Righter M.D.   On: 08/03/2018 15:10   Mr Brain Wo Contrast (neuro Protocol)  Result Date: 08/03/2018 CLINICAL DATA:  Dizziness since yesterday. History of hypertension, atrial fibrillation, and cardiac arrhythmia. EXAM: MRI HEAD WITHOUT CONTRAST MRA HEAD WITHOUT CONTRAST TECHNIQUE: Multiplanar, multiecho pulse sequences of the brain and surrounding structures were obtained without intravenous contrast. Angiographic images of the head were obtained using MRA technique without contrast. COMPARISON:  CT head earlier today was unremarkable. FINDINGS: MRI HEAD FINDINGS Brain: Small focus of restricted diffusion, corresponding low ADC, RIGHT superior cerebellum, confirmed on axial and coronal DWI, consistent with acute infarct. See series 3, image 70. Elsewhere, no acute stroke, hemorrhage, mass lesion, hydrocephalus, or extra-axial fluid. Paragraph mild cerebral and cerebellar atrophy. Mild subcortical and periventricular T2 and FLAIR hyperintensities, likely chronic microvascular ischemic change. Chronic lacunar infarcts affect the brainstem most notably in the RIGHT paramedian pons, as well as punctate areas of chronic ischemia the elsewhere in the RIGHT cerebellum. Vascular: Reported separately. Skull and upper cervical spine: No acute findings. Bifrontal hyperostosis. Sinuses/Orbits: No significant paranasal sinus disease. BILATERAL cataract extraction, otherwise unremarkable orbits. Other: None. MRA HEAD FINDINGS The internal carotid arteries are widely patent. The basilar artery is widely patent with vertebrals both contributing, LEFT slightly larger. No proximal stenosis of the anterior or middle cerebral arteries. No intracranial saccular aneurysm. RIGHT superior cerebellar artery poorly visualized. Moderately diseased LEFT PICA. Poorly  visualized LEFT AICA. LEFT SCA patent. Focal 75% stenosis of the proximal RIGHT posterior cerebral artery P1 segment. IMPRESSION: 3 x 5 mm acute RIGHT superior cerebellar infarct. Poorly visualized RIGHT superior cerebellar artery could be occluded. Elsewhere, normal for age cerebral volume with mild small vessel disease. No proximal large vessel stenosis or occlusion. Electronically Signed   By: Staci Righter M.D.   On: 08/03/2018 15:10    Procedures Procedures (including critical care time)  Medications Ordered in ED Medications  sodium chloride flush (NS) 0.9 % injection 3 mL (3 mLs Intravenous Not Given 08/03/18  1149)  hydrALAZINE (APRESOLINE) injection 10 mg (has no administration in time range)  atorvastatin (LIPITOR) tablet 80 mg (has no administration in time range)  metoprolol tartrate (LOPRESSOR) tablet 25 mg (has no administration in time range)  vitamin B-12 (CYANOCOBALAMIN) tablet 500 mcg (has no administration in time range)  gabapentin (NEURONTIN) capsule 600 mg (has no administration in time range)  tiZANidine (ZANAFLEX) tablet 4 mg (has no administration in time range)  loratadine (CLARITIN) tablet 10 mg (has no administration in time range)  Calcium Carbonate-Vitamin D 500-50 MG-UNIT CAPS 1 tablet (has no administration in time range)  sodium chloride flush (NS) 0.9 % injection 3 mL (has no administration in time range)  sodium chloride flush (NS) 0.9 % injection 3 mL (has no administration in time range)  0.9 %  sodium chloride infusion (has no administration in time range)  acetaminophen (TYLENOL) tablet 650 mg (has no administration in time range)    Or  acetaminophen (TYLENOL) suppository 650 mg (has no administration in time range)  traZODone (DESYREL) tablet 50 mg (has no administration in time range)  polyethylene glycol (MIRALAX / GLYCOLAX) packet 17 g (has no administration in time range)  ondansetron (ZOFRAN) tablet 4 mg (has no administration in time range)    Or   ondansetron (ZOFRAN) injection 4 mg (has no administration in time range)  albuterol (PROVENTIL) (2.5 MG/3ML) 0.083% nebulizer solution 2.5 mg (has no administration in time range)  heparin injection 5,000 Units (has no administration in time range)  aspirin chewable tablet 81 mg (has no administration in time range)  clopidogrel (PLAVIX) tablet 75 mg (has no administration in time range)  ondansetron (ZOFRAN) injection 4 mg (4 mg Intravenous Given 08/03/18 1403)  LORazepam (ATIVAN) injection 0.5 mg (0.5 mg Intravenous Given 08/03/18 1403)     Initial Impression / Assessment and Plan / ED Course  I have reviewed the triage vital signs and the nursing notes.  Pertinent labs & imaging results that were available during my care of the patient were reviewed by me and considered in my medical decision making (see chart for details).        Patient presenting for evaluation of acute onset, persistent dizziness that she noticed upon awakening yesterday morning.  Last known normal was Sunday night.  She is afebrile, persistently hypertensive in the ED.  Given persistent dizziness coupled with her history of hyperlipidemia and her hypertension, I am concerned she may have had a posterior circulation stroke.  We will obtain blood work and CT for for initial evaluation.  Lab work reviewed by me shows no leukocytosis, no anemia, no metabolic derangements.  No renal insufficiency.  Head CT shows no acute intracranial abnormalities.  On reevaluation patient reports persistent dizziness.  Will obtain MRI of the brain for further evaluation.  MR/MRA consistent with 3 x 5 mm acute right superior cerebellar infarct.  Possible occlusion of the right superior cerebellar artery.  Tele-neurology was consulted and assessed the patient emergently in the ED, recommends initiation of Plavix and admission for stroke work-up.  Spoke with Dr. Denton Brick with [facility] hospitalist service who agrees to assume care of patient and  bring her into the hospital for further evaluation and management.  Final Clinical Impressions(s) / ED Diagnoses   Final diagnoses:  New cerebellar infarct [facility])    ED Discharge Orders    None       Renita Papa, PA-C 08/03/18 [place], [place], DO 08/06/18 1658

## 2018-08-03 NOTE — ED Notes (Signed)
Pt states she is unable to provide a urine specimen at this time.   RN will check back with patient at a later time

## 2018-08-03 NOTE — H&P (Signed)
Patient Demographics:    Sabrina Taylor, is a 73 y.o. female  MRN: [id number]   DOB - 11-25-1945  Admit Date - 08/03/2018  Outpatient Primary MD for the patient is [name]   Assessment & Plan:    Principal Problem:   CVA (cerebral vascular accident)/right superior cerebelar infarct Active Problems:   OBESITY   HLD (hyperlipidemia)   DM (diabetes mellitus), type 2 ([place])    1)Acute Right Cerebellar CVA----Place patient on telemetry monitored unit, patient with history of questionable paroxysmal A. fib in the past, watch for arrhythmias on telemetry.  Brain MRI/MRA Head showed 3 x 5 mm acute RIGHT superior cerebellar infarct MRA brain Poorly visualized RIGHT superior cerebellar artery could be occluded, telemetry neurology consult appreciated, PTA patient was on aspirin 81 mg and Lipitor 20 mg, add Plavix 75 mg daily , patient will probably need combination of aspirin and Plavix for at least 3 weeks and then probably have to be on Plavix monotherapy after that.. Increase Lipitor to 80 mg daily... Neurology consult from Dr. Merlene Laughter requested.  MRA Head noted with possible right superior cerebellar artery occlusion, no LVO or hemodynamically significant stenosis.  await neurology input.. Echocardiogram to rule out intracardiac thrombus and evaluate EF is pending, PT and OT eval pending. Patient passed bedside swallow eval, neurochecks as ordered, TSH is 1.4, A1c is pending, fasting lipid profile pending  2)DM2--A1c pending, hold metformin to allow for possible contrast study if needed, Use Novolog/Humalog Sliding scale insulin with Accu-Cheks/Fingersticks as ordered   3)HTN--- BP is not at goal, Allow some permissive Hypertension due to acute stroke, avoid very aggressive, rapid BP control at this time.   Continue metoprolol 25 mg twice daily, hold lisinopril,  may use IV hydralazine as needed per parameters     With History of - Reviewed by me  Past Medical History:  Diagnosis Date   DDD (degenerative disc disease)    Low back pain   First degree AV block    Hyperlipidemia    Hypertension    LBBB (left bundle branch block)    Obesity    PAF (paroxysmal atrial fibrillation) (HCC)    PONV (postoperative nausea and vomiting)       Past Surgical History:  Procedure Laterality Date   APPENDECTOMY  1960   BACK SURGERY  may 2011   CATARACT EXTRACTION W/PHACO Right 06/05/2014   Procedure: CATARACT EXTRACTION PHACO AND INTRAOCULAR LENS PLACEMENT ([place]);  Surgeon: Tonny Branch, MD;  Location: [facility];  Service: Ophthalmology;  Laterality: Right;  CDE: 6.42   CATARACT EXTRACTION W/PHACO Left 06/19/2014   Procedure: CATARACT EXTRACTION PHACO AND INTRAOCULAR LENS PLACEMENT ([facility]);  Surgeon: Tonny Branch, MD;  Location: [facility];  Service: Ophthalmology;  Laterality: Left;  CDE 5.61   CHOLECYSTECTOMY     COLONOSCOPY  03/25/2011   Procedure: COLONOSCOPY;  Surgeon: Jamesetta So, MD;  Location: [facility];  Service: Gastroenterology;  Laterality: N/A;  KNEE ARTHROSCOPY  2004, 2008   Right   TOTAL ABDOMINAL HYSTERECTOMY        Chief Complaint  Patient presents with   Dizziness      HPI:    Sabrina Taylor  is a 73 y.o. female with past medical history relevant for HTN, DM 2, and HLD as well as obesity and status post menopause who presents with complaints of dizziness of more than 24 hours duration... Symptoms apparently started on 08/01/2018.... Dizziness worsened to the point where patient had difficulty with gait and felt like she might pass out,, patient also has some vertigo and the room was spinning around and she felt very lightheaded  At home patient noted that BP was elevated at 204/110, she took an extra dose of lisinopril but BP remains elevated,   Patient had frontal  headaches,, no visual disturbance... No chest pains no palpitations, no shortness of breath, no swallowing difficulties, no vomiting no diarrhea no paresthesia no focal extremity weakness per se... Patient does have sensory deficits specifically peripheral neuropathy of both feet which apparently are not new she takes Neurontin for this  In ED ---Brain MRI  And MRA head showed 3 x 5 mm acute RIGHT superior cerebellar infarct MRA brain Poorly visualized RIGHT superior cerebellar artery could be occluded  Tele- neurology consulted and recommended overnight hospitalization for further neuro work-up    Review of systems:    In addition to the HPI above,   A full Review of  Systems was done, all other systems reviewed are negative except as noted above in HPI , .    Social History:  Reviewed by me    Social History   Tobacco Use   Smoking status: Never Smoker   Smokeless tobacco: Never Used  Substance Use Topics   Alcohol use: No       Family History :  Reviewed by me    Family History  Problem Relation Age of Onset   Hypertension Mother    Alzheimer's disease Mother    Stroke Father    Coronary artery disease Brother      Home Medications:   Prior to Admission medications   Medication Sig Start Date End Date Taking? Authorizing Provider  aspirin 81 MG EC tablet Take 81 mg by mouth daily.     Yes [provider]  atorvastatin (LIPITOR) 20 MG tablet take 1 tablet by mouth once daily Patient taking differently: Take 20 mg by mouth every evening.  01/17/11  Yes Rothbart, Cristopher Estimable, MD  Calcium Carbonate-Vitamin D (CALCIUM PLUS VITAMIN D) 500-50 MG-UNIT CAPS Take 1 tablet by mouth every evening.    Yes [provider]  Cinnamon 500 MG TABS Take 500 mg by mouth every evening.    Yes [provider]  gabapentin (NEURONTIN) 300 MG capsule Take 600 mg by mouth 2 (two) times daily.  12/19/13  Yes [provider]  Ginger, Zingiber  officinalis, (GINGER ROOT PO) Take 1 tablet by mouth every evening.   Yes [provider]  ibuprofen (ADVIL,MOTRIN) 200 MG tablet Take 400 mg by mouth every 6 (six) hours as needed for mild pain.    Yes [provider]  lisinopril (ZESTRIL) 10 MG tablet Take 10 mg by mouth daily.    Yes [provider]  loratadine (CLARITIN) 10 MG tablet Take 10 mg by mouth daily.   Yes [provider]  Magnesium Chloride (MAGNESIUM DR PO) Take 1 tablet by mouth every evening.  Yes [provider]  metFORMIN (GLUCOPHAGE-XR) 500 MG 24 hr tablet Take 500 mg by mouth every evening.  05/19/18  Yes [provider]  metoprolol tartrate (LOPRESSOR) 25 MG tablet Take 25 mg by mouth every morning. 05/19/18  Yes [provider]  Misc Natural Products (BLACK CHERRY CONCENTRATE PO) Take 1 tablet by mouth every evening.    Yes [provider]  tiZANidine (ZANAFLEX) 4 MG tablet Take 4 mg by mouth at bedtime. 07/24/18  Yes [provider]  TURMERIC PO Take 1 capsule by mouth every evening.    Yes [provider]  vitamin B-12 (CYANOCOBALAMIN) 500 MCG tablet Take 1 tablet by mouth every morning.    Yes [provider]     Allergies:     Allergies  Allergen Reactions   Penicillins Hives    Did it involve swelling of the face/tongue/throat, SOB, or low BP? No Did it involve sudden or severe rash/hives, skin peeling, or any reaction on the inside of your mouth or nose? Yes-Hives Did you need to seek medical attention at a hospital or doctor's office? Yes When did it last happen?73 years old If all above answers are NO, may proceed with cephalosporin use.      Physical Exam:   Vitals  Blood pressure (!) 166/65, pulse 69, temperature 98.1 F (36.7 C), resp. rate 14, height 5\' 8"  (1.727 m), weight 95.3 kg, SpO2 96 %.  Physical Examination: General appearance - alert, well appearing, and in no distress Mental status -  alert, oriented to person, place, and time,  Eyes - sclera anicteric Neck - supple, no JVD elevation , Chest - clear  to auscultation bilaterally, symmetrical air movement,  Heart - S1 and S2 normal, regular (history of paroxysmal A. fib currently regular) Abdomen - soft, nontender, nondistended, no masses or organomegaly Extremities - no pedal edema noted, intact peripheral pulses  Skin - warm, dry Neurologial Exam: Mental Status: Patient is awake, alert, oriented to person, place, month, year, and situation. Patient is able to give a clear and coherent history. No signs of aphasia or neglect Cranial Nerves: II: Visual Fields are full. Pupils are equal, round, and reactive to light.   III,IV, VI: EOMI without ptosis or diploplia.  V: Facial sensation is symmetrical and wnl VII: Facial movement is symmetric.  VIII: hearing is intact to voice X: Uvula elevates symmetrically XI: Shoulder shrug is symmetric. XII: tongue is midline without atrophy or fasciculations.  Motor: Tone is normal. Bulk is normal. 5/5 strength was present in all four extremities.  Sensory: Sensation is  diminished in both feet due to peripheral neuropathy which is not new  cerebellar: FNF without ataxia, heel-to-shin WNL     Data Review:    CBC Recent Labs  Lab 08/03/18 1119 08/03/18 1253  WBC 5.7  --   HGB 13.8  --   HCT 42.2  --   PLT 159  --   MCV 98.4  --   MCH 32.2  --   MCHC 32.7  --   RDW 12.3  --   LYMPHSABS  --  1.7  MONOABS  --  0.4  EOSABS  --  0.4  BASOSABS  --  0.0   ------------------------------------------------------------------------------------------------------------------  Chemistries  Recent Labs  Lab 08/03/18 1119  NA 141  K 3.7  CL 105  CO2 24  GLUCOSE 118*  BUN 23  CREATININE 0.79  CALCIUM 9.1   ------------------------------------------------------------------------------------------------------------------ estimated creatinine clearance is 75.6 mL/min  (by  C-G formula based on SCr of 0.79 mg/dL). ------------------------------------------------------------------------------------------------------------------ Recent Labs    08/03/18 1253  TSH 1.424     Coagulation profile Recent Labs  Lab 08/03/18 1253  INR 1.0   ------------------------------------------------------------------------------------------------------------------- No results for input(s): DDIMER in the last 72 hours. -------------------------------------------------------------------------------------------------------------------  Cardiac Enzymes No results for input(s): CKMB, TROPONINI, MYOGLOBIN in the last 168 hours.  Invalid input(s): CK ------------------------------------------------------------------------------------------------------------------ No results found for: BNP   ---------------------------------------------------------------------------------------------------------------  Urinalysis    Component Value Date/Time   COLORURINE YELLOW 08/03/2018 1105   APPEARANCEUR HAZY (A) 08/03/2018 1105   LABSPEC 1.014 08/03/2018 1105   PHURINE 6.0 08/03/2018 1105   GLUCOSEU NEGATIVE 08/03/2018 1105   HGBUR NEGATIVE 08/03/2018 1105   [place] 08/03/2018 1105   KETONESUR NEGATIVE 08/03/2018 1105   PROTEINUR NEGATIVE 08/03/2018 1105   NITRITE NEGATIVE 08/03/2018 1105   LEUKOCYTESUR NEGATIVE 08/03/2018 1105    ----------------------------------------------------------------------------------------------------------------   Imaging Results:    Ct Head Wo Contrast  Result Date: 08/03/2018 CLINICAL DATA:  Dizziness. EXAM: CT HEAD WITHOUT CONTRAST TECHNIQUE: Contiguous axial images were obtained from the base of the skull through the vertex without intravenous contrast. COMPARISON:  None. FINDINGS: Brain: No evidence of acute infarction, hemorrhage, hydrocephalus, extra-axial collection or mass lesion/mass effect. Vascular: Atherosclerotic  vascular calcification of the carotid siphons. No hyperdense vessel. Skull: Negative for fracture or focal lesion. Sinuses/Orbits: No acute finding. Other: None. IMPRESSION: 1.  No acute intracranial abnormality. Electronically Signed   By: Titus Dubin M.D.   On: 08/03/2018 13:37   Mr Jodene Nam Head Wo Contrast  Result Date: 08/03/2018 CLINICAL DATA:  Dizziness since yesterday. History of hypertension, atrial fibrillation, and cardiac arrhythmia. EXAM: MRI HEAD WITHOUT CONTRAST MRA HEAD WITHOUT CONTRAST TECHNIQUE: Multiplanar, multiecho pulse sequences of the brain and surrounding structures were obtained without intravenous contrast. Angiographic images of the head were obtained using MRA technique without contrast. COMPARISON:  CT head earlier today was unremarkable. FINDINGS: MRI HEAD FINDINGS Brain: Small focus of restricted diffusion, corresponding low ADC, RIGHT superior cerebellum, confirmed on axial and coronal DWI, consistent with acute infarct. See series 3, image 70. Elsewhere, no acute stroke, hemorrhage, mass lesion, hydrocephalus, or extra-axial fluid. Paragraph mild cerebral and cerebellar atrophy. Mild subcortical and periventricular T2 and FLAIR hyperintensities, likely chronic microvascular ischemic change. Chronic lacunar infarcts affect the brainstem most notably in the RIGHT paramedian pons, as well as punctate areas of chronic ischemia the elsewhere in the RIGHT cerebellum. Vascular: Reported separately. Skull and upper cervical spine: No acute findings. Bifrontal hyperostosis. Sinuses/Orbits: No significant paranasal sinus disease. BILATERAL cataract extraction, otherwise unremarkable orbits. Other: None. MRA HEAD FINDINGS The internal carotid arteries are widely patent. The basilar artery is widely patent with vertebrals both contributing, LEFT slightly larger. No proximal stenosis of the anterior or middle cerebral arteries. No intracranial saccular aneurysm. RIGHT superior cerebellar  artery poorly visualized. Moderately diseased LEFT PICA. Poorly visualized LEFT AICA. LEFT SCA patent. Focal 75% stenosis of the proximal RIGHT posterior cerebral artery P1 segment. IMPRESSION: 3 x 5 mm acute RIGHT superior cerebellar infarct. Poorly visualized RIGHT superior cerebellar artery could be occluded. Elsewhere, normal for age cerebral volume with mild small vessel disease. No proximal large vessel stenosis or occlusion. Electronically Signed   By: Staci Righter M.D.   On: 08/03/2018 15:10   Mr Brain Wo Contrast (neuro Protocol)  Result Date: 08/03/2018 CLINICAL DATA:  Dizziness since yesterday. History of hypertension, atrial fibrillation, and cardiac arrhythmia. EXAM: MRI HEAD WITHOUT CONTRAST MRA HEAD WITHOUT CONTRAST TECHNIQUE:  Multiplanar, multiecho pulse sequences of the brain and surrounding structures were obtained without intravenous contrast. Angiographic images of the head were obtained using MRA technique without contrast. COMPARISON:  CT head earlier today was unremarkable. FINDINGS: MRI HEAD FINDINGS Brain: Small focus of restricted diffusion, corresponding low ADC, RIGHT superior cerebellum, confirmed on axial and coronal DWI, consistent with acute infarct. See series 3, image 70. Elsewhere, no acute stroke, hemorrhage, mass lesion, hydrocephalus, or extra-axial fluid. Paragraph mild cerebral and cerebellar atrophy. Mild subcortical and periventricular T2 and FLAIR hyperintensities, likely chronic microvascular ischemic change. Chronic lacunar infarcts affect the brainstem most notably in the RIGHT paramedian pons, as well as punctate areas of chronic ischemia the elsewhere in the RIGHT cerebellum. Vascular: Reported separately. Skull and upper cervical spine: No acute findings. Bifrontal hyperostosis. Sinuses/Orbits: No significant paranasal sinus disease. BILATERAL cataract extraction, otherwise unremarkable orbits. Other: None. MRA HEAD FINDINGS The internal carotid arteries are  widely patent. The basilar artery is widely patent with vertebrals both contributing, LEFT slightly larger. No proximal stenosis of the anterior or middle cerebral arteries. No intracranial saccular aneurysm. RIGHT superior cerebellar artery poorly visualized. Moderately diseased LEFT PICA. Poorly visualized LEFT AICA. LEFT SCA patent. Focal 75% stenosis of the proximal RIGHT posterior cerebral artery P1 segment. IMPRESSION: 3 x 5 mm acute RIGHT superior cerebellar infarct. Poorly visualized RIGHT superior cerebellar artery could be occluded. Elsewhere, normal for age cerebral volume with mild small vessel disease. No proximal large vessel stenosis or occlusion. Electronically Signed   By: Staci Righter M.D.   On: 08/03/2018 15:10    Radiological Exams on Admission: Ct Head Wo Contrast  Result Date: 08/03/2018 CLINICAL DATA:  Dizziness. EXAM: CT HEAD WITHOUT CONTRAST TECHNIQUE: Contiguous axial images were obtained from the base of the skull through the vertex without intravenous contrast. COMPARISON:  None. FINDINGS: Brain: No evidence of acute infarction, hemorrhage, hydrocephalus, extra-axial collection or mass lesion/mass effect. Vascular: Atherosclerotic vascular calcification of the carotid siphons. No hyperdense vessel. Skull: Negative for fracture or focal lesion. Sinuses/Orbits: No acute finding. Other: None. IMPRESSION: 1.  No acute intracranial abnormality. Electronically Signed   By: Titus Dubin M.D.   On: 08/03/2018 13:37   Mr Jodene Nam Head Wo Contrast  Result Date: 08/03/2018 CLINICAL DATA:  Dizziness since yesterday. History of hypertension, atrial fibrillation, and cardiac arrhythmia. EXAM: MRI HEAD WITHOUT CONTRAST MRA HEAD WITHOUT CONTRAST TECHNIQUE: Multiplanar, multiecho pulse sequences of the brain and surrounding structures were obtained without intravenous contrast. Angiographic images of the head were obtained using MRA technique without contrast. COMPARISON:  CT head earlier today was  unremarkable. FINDINGS: MRI HEAD FINDINGS Brain: Small focus of restricted diffusion, corresponding low ADC, RIGHT superior cerebellum, confirmed on axial and coronal DWI, consistent with acute infarct. See series 3, image 70. Elsewhere, no acute stroke, hemorrhage, mass lesion, hydrocephalus, or extra-axial fluid. Paragraph mild cerebral and cerebellar atrophy. Mild subcortical and periventricular T2 and FLAIR hyperintensities, likely chronic microvascular ischemic change. Chronic lacunar infarcts affect the brainstem most notably in the RIGHT paramedian pons, as well as punctate areas of chronic ischemia the elsewhere in the RIGHT cerebellum. Vascular: Reported separately. Skull and upper cervical spine: No acute findings. Bifrontal hyperostosis. Sinuses/Orbits: No significant paranasal sinus disease. BILATERAL cataract extraction, otherwise unremarkable orbits. Other: None. MRA HEAD FINDINGS The internal carotid arteries are widely patent. The basilar artery is widely patent with vertebrals both contributing, LEFT slightly larger. No proximal stenosis of the anterior or middle cerebral arteries. No intracranial saccular aneurysm. RIGHT superior cerebellar artery  poorly visualized. Moderately diseased LEFT PICA. Poorly visualized LEFT AICA. LEFT SCA patent. Focal 75% stenosis of the proximal RIGHT posterior cerebral artery P1 segment. IMPRESSION: 3 x 5 mm acute RIGHT superior cerebellar infarct. Poorly visualized RIGHT superior cerebellar artery could be occluded. Elsewhere, normal for age cerebral volume with mild small vessel disease. No proximal large vessel stenosis or occlusion. Electronically Signed   By: Staci Righter M.D.   On: 08/03/2018 15:10   Mr Brain Wo Contrast (neuro Protocol)  Result Date: 08/03/2018 CLINICAL DATA:  Dizziness since yesterday. History of hypertension, atrial fibrillation, and cardiac arrhythmia. EXAM: MRI HEAD WITHOUT CONTRAST MRA HEAD WITHOUT CONTRAST TECHNIQUE: Multiplanar,  multiecho pulse sequences of the brain and surrounding structures were obtained without intravenous contrast. Angiographic images of the head were obtained using MRA technique without contrast. COMPARISON:  CT head earlier today was unremarkable. FINDINGS: MRI HEAD FINDINGS Brain: Small focus of restricted diffusion, corresponding low ADC, RIGHT superior cerebellum, confirmed on axial and coronal DWI, consistent with acute infarct. See series 3, image 70. Elsewhere, no acute stroke, hemorrhage, mass lesion, hydrocephalus, or extra-axial fluid. Paragraph mild cerebral and cerebellar atrophy. Mild subcortical and periventricular T2 and FLAIR hyperintensities, likely chronic microvascular ischemic change. Chronic lacunar infarcts affect the brainstem most notably in the RIGHT paramedian pons, as well as punctate areas of chronic ischemia the elsewhere in the RIGHT cerebellum. Vascular: Reported separately. Skull and upper cervical spine: No acute findings. Bifrontal hyperostosis. Sinuses/Orbits: No significant paranasal sinus disease. BILATERAL cataract extraction, otherwise unremarkable orbits. Other: None. MRA HEAD FINDINGS The internal carotid arteries are widely patent. The basilar artery is widely patent with vertebrals both contributing, LEFT slightly larger. No proximal stenosis of the anterior or middle cerebral arteries. No intracranial saccular aneurysm. RIGHT superior cerebellar artery poorly visualized. Moderately diseased LEFT PICA. Poorly visualized LEFT AICA. LEFT SCA patent. Focal 75% stenosis of the proximal RIGHT posterior cerebral artery P1 segment. IMPRESSION: 3 x 5 mm acute RIGHT superior cerebellar infarct. Poorly visualized RIGHT superior cerebellar artery could be occluded. Elsewhere, normal for age cerebral volume with mild small vessel disease. No proximal large vessel stenosis or occlusion. Electronically Signed   By: Staci Righter M.D.   On: 08/03/2018 15:10    DVT Prophylaxis  -SCD/heparin AM Labs Ordered, also please review Full Orders  Family Communication: Admission, patients condition and plan of care including tests being ordered have been discussed with the patient who indicate understanding and agree with the plan   Code Status - Full Code  Likely DC to home  Condition   -stable  Roxan Hockey M.D on 08/03/2018 at 7:57 PM Go to [email or web address] -  for contact info  [facility] - Office  [phone]

## 2018-08-03 NOTE — ED Triage Notes (Signed)
Pt presents to ED with complaints of dizziness. Pt states she woke up with dizziness since yesterday morning. Pt also states she has noticed her BP has been high at home (204/110)

## 2018-08-04 ENCOUNTER — Inpatient Hospital Stay (HOSPITAL_COMMUNITY): Payer: Medicare HMO

## 2018-08-04 DIAGNOSIS — E785 Hyperlipidemia, unspecified: Secondary | ICD-10-CM

## 2018-08-04 DIAGNOSIS — R269 Unspecified abnormalities of gait and mobility: Secondary | ICD-10-CM | POA: Diagnosis not present

## 2018-08-04 LAB — BASIC METABOLIC PANEL
Anion gap: 9 (ref 5–15)
BUN: 29 mg/dL — ABNORMAL HIGH (ref 8–23)
CO2: 26 mmol/L (ref 22–32)
Calcium: 9.6 mg/dL (ref 8.9–10.3)
Chloride: 105 mmol/L (ref 98–111)
Creatinine, Ser: 1.35 mg/dL — ABNORMAL HIGH (ref 0.44–1.00)
GFR calc Af Amer: 45 mL/min — ABNORMAL LOW (ref >60)
GFR calc non Af Amer: 39 mL/min — ABNORMAL LOW (ref >60)
Glucose, Bld: 121 mg/dL — ABNORMAL HIGH (ref 70–99)
Potassium: 4.2 mmol/L (ref 3.5–5.1)
Sodium: 140 mmol/L (ref 135–145)

## 2018-08-04 LAB — GLUCOSE, CAPILLARY
Glucose-Capillary: 88 mg/dL (ref 70–99)
Glucose-Capillary: 96 mg/dL (ref 70–99)
Glucose-Capillary: 98 mg/dL (ref 70–99)
Glucose-Capillary: 80 mg/dL (ref 70–99)

## 2018-08-04 LAB — CBC
HCT: 39.2 % (ref 36.0–46.0)
Hemoglobin: 12.5 g/dL (ref 12.0–15.0)
MCH: 31.9 pg (ref 26.0–34.0)
MCHC: 31.9 g/dL (ref 30.0–36.0)
MCV: 100 fL (ref 80.0–100.0)
Platelets: 162 10*3/uL (ref 150–400)
RBC: 3.92 MIL/uL (ref 3.87–5.11)
RDW: 12.2 % (ref 11.5–15.5)
WBC: 6.3 10*3/uL (ref 4.0–10.5)
nRBC: 0 % (ref 0.0–0.2)

## 2018-08-04 LAB — LIPID PANEL
Cholesterol: 134 mg/dL (ref 0–200)
HDL: 35 mg/dL — ABNORMAL LOW (ref >40)
LDL Cholesterol: 72 mg/dL (ref 0–99)
Total CHOL/HDL Ratio: 3.8 RATIO
Triglycerides: 134 mg/dL (ref <150)
VLDL: 27 mg/dL (ref 0–40)

## 2018-08-04 LAB — NOVEL CORONAVIRUS, NAA (HOSP ORDER, SEND-OUT TO REF LAB; TAT 18-24 HRS): SARS-CoV-2, NAA: NOT DETECTED

## 2018-08-04 MED ORDER — SODIUM CHLORIDE 0.9 % IV SOLN
INTRAVENOUS | Status: DC
  Administered 2018-08-04: 08:00:00 via INTRAVENOUS

## 2018-08-04 MED ORDER — SODIUM CHLORIDE 0.9 % IV SOLN
INTRAVENOUS | Status: DC
  Administered 2018-08-04: 18:00:00 via INTRAVENOUS

## 2018-08-04 MED ORDER — SODIUM CHLORIDE 0.9 % IV BOLUS
500.0000 mL | Freq: Once | INTRAVENOUS | Status: AC
  Administered 2018-08-04: 500 mL via INTRAVENOUS

## 2018-08-04 NOTE — Plan of Care (Signed)
  Problem: Acute Rehab PT Goals(only PT should resolve) Goal: Patient Will Transfer Sit To/From Stand Outcome: Progressing Flowsheets (Taken 08/04/2018 1007) Patient will transfer sit to/from stand: with modified independence   Problem: Acute Rehab PT Goals(only PT should resolve) Goal: Pt Will Transfer Bed To Chair/Chair To Bed Outcome: Progressing Flowsheets (Taken 08/04/2018 1007) Pt will Transfer Bed to Chair/Chair to Bed: with modified independence   Problem: Acute Rehab PT Goals(only PT should resolve) Goal: Pt Will Ambulate Outcome: Progressing Flowsheets (Taken 08/04/2018 1007) Pt will Ambulate: > 125 feet; with min guard assist; with least restrictive assistive device   Problem: Acute Rehab PT Goals(only PT should resolve) Goal: Pt Will Go Up/Down Stairs Outcome: Progressing Flowsheets (Taken 08/04/2018 1007) Pt will Go Up / Down Stairs: Flight; with min guard assist; with rail(s)   10:08 AM, 08/04/18 Talbot Grumbling, DPT Physical Therapist with [facility] [phone] office

## 2018-08-04 NOTE — Progress Notes (Addendum)
Patient Demographics:    Sabrina Taylor, is a 73 y.o. female, DOB - 12/19/1945, EXH:[id number]  Admit date - 08/03/2018   Admitting Physician [name], MD  Outpatient Primary MD for the patient is [name] [name]  LOS - 1   Chief Complaint  Patient presents with   Dizziness        Subjective:    Sabrina Taylor today has no fevers, no emesis,  No chest pain, dizziness improving, gait problems improving, elevated creatinine noted but patient is voiding okay  Assessment  & Plan :    Principal Problem:   CVA (cerebral vascular accident)/right superior cerebelar infarct Active Problems:   OBESITY   HLD (hyperlipidemia)   DM (diabetes mellitus), type 2 [name])  Brief Summary 73 year old with past medical history relevant for DM2, HTN and HLD as well as obesity and status post menopause admitted on 08/03/2018 with dizziness and gait problems found to have acute right cerebellar infarct, post admission patient developed AKI with creatinine up to 1.35 from 0.79  A/p 1)Acute Right Cerebellar CVA---- on telemetry monitored unit no significant arrhythmias so far, patient with history of questionable paroxysmal A. fib in the past, watch for arrhythmias on telemetry.  Brain MRI/MRA Head showed 3 x 5 mm acute RIGHT superior cerebellar infarct MRA brain Poorly visualized RIGHT superior cerebellar artery could be occluded, telemetry neurology consult appreciated, discussed with Dr. Phillips Odor , he advised outpatient neurology follow-up in 3 to 4 weeks, PTA patient was on aspirin 81 mg and Lipitor 20 mg, added Plavix 75 mg daily , patient will need combination of aspirin and Plavix for at least 3 weeks and then probably have to be on Plavix monotherapy after that.. Increasd  Lipitor to 80 mg daily...    MRA Head noted with possible right superior cerebellar artery occlusion, no LVO or hemodynamically  significant stenosis.    Awaiting Echocardiogram to rule out intracardiac thrombus and evaluate EF is pending (Echocardiogram was delayed as patient's COVID-19 test was not back ), PT recommends home health PT  patient passed bedside swallow eval,  eating and drinking okay, TSH is 1.4, A1c is 5.7, fasting lipid profile with LDL of 72, HDL of 35 and triglycerides of 134 Even if his lipid panel is within desired limits, patient should still take Lipitor/Statin for it's Pleiotropic effects (beyond cholesterol lowering benefits)  2)DM2--A1c 5.7,  reflecting excellent diabetic control, c/n to  hold metformin ,  Use Novolog/Humalog Sliding scale insulin with Accu-Cheks/Fingersticks as ordered   3)HTN--- BP is not at goal, Allow some permissive Hypertension due to acute stroke, avoid very aggressive, rapid BP control at this time.  Continue metoprolol 25 mg twice daily, hold lisinopril,  may use IV hydralazine as needed per parameters  4)AKI-- post admission patient developed AKI with creatinine up to 1.35 from 0.79, had transient hypotension, continue IV fluids, continue to hold metformin, repeat BMP in a.m.   Disposition/Need for in-Hospital Stay- patient unable to be discharged at this time due to AKi-post admission patient developed AKI with creatinine up to 1.35 from 0.79--requiring IV fluids  awaiting echocardiogram as part of stroke work-up, continue physical therapy  Code Status : Full  Family Communication:   Na    Disposition Plan  :  Possibly home on 08/05/2018 with home health  Consults  : Phone consultation with neurologist  DVT Prophylaxis  :    Heparin - SCDs   Lab Results  Component Value Date   PLT 162 08/04/2018    Inpatient Medications  Scheduled Meds:  aspirin  81 mg Oral Q breakfast   atorvastatin  80 mg Oral QPM   calcium-vitamin D  1 tablet Oral QPM   clopidogrel  75 mg Oral Daily   gabapentin  600 mg Oral BID   heparin  5,000 Units Subcutaneous Q8H    insulin aspart  0-5 Units Subcutaneous QHS   insulin aspart  0-9 Units Subcutaneous TID WC   loratadine  10 mg Oral Daily   sodium chloride flush  3 mL Intravenous Once   sodium chloride flush  3 mL Intravenous Q12H   tiZANidine  4 mg Oral QHS   vitamin B-12  500 mcg Oral q morning - 10a   Continuous Infusions:  sodium chloride     PRN Meds:.sodium chloride, acetaminophen **OR** acetaminophen, albuterol, hydrALAZINE, ondansetron **OR** ondansetron (ZOFRAN) IV, polyethylene glycol, sodium chloride flush, traZODone    Anti-infectives (From admission, onward)   None        Objective:   Vitals:   08/04/18 0600 08/04/18 0800 08/04/18 1200 08/04/18 1600  BP: 122/64 136/69 (!) 152/70 (!) 174/73  Pulse: (!) 54 (!) 50 (!) 53 (!) 55  Resp: 16 16 16 18   Temp: 98 F (36.7 C)   98.2 F (36.8 C)  TempSrc: Oral   Oral  SpO2: 98% 98% 99%   Weight:      Height:        Wt Readings from Last 3 Encounters:  08/03/18 93.4 kg  04/20/18 93.4 kg  03/26/18 91.6 kg     Intake/Output Summary (Last 24 hours) at 08/04/2018 1701 Last data filed at 08/04/2018 1234 Gross per 24 hour  Intake 1220 ml  Output --  Net 1220 ml     Physical Exam Patient is examined daily including today on 08/04/2018 , exams remain the same as of yesterday except that has changed   Gen:- Awake Alert,  In no apparent distress  HEENT:- [place].AT, No sclera icterus Neck-Supple Neck,No JVD,.  Lungs-  CTAB , fair symmetrical air movement CV- S1, S2 normal, regular  Abd-  +ve B.Sounds, Abd Soft, No tenderness,    Extremity/Skin:- No  edema, pedal pulses present  Psych-affect is appropriate, oriented x3 Neuro-dizziness and gait concerns continues to improve, no new focal deficits, no tremors   Data Review:   Micro Results Recent Results (from the past 240 hour(s))  Novel Coronavirus,NAA,(SEND-OUT TO REF LAB - TAT 24-48 hrs); Hosp Order     Status: None   Collection Time: 08/03/18  3:47 PM  Result Value Ref  Range Status   SARS-CoV-2, NAA NOT DETECTED NOT DETECTED Final    Comment: (NOTE) This test was developed and its performance characteristics determined by [employer]. This test has not been FDA cleared or approved. This test has been authorized by FDA under an Emergency Use Authorization (EUA). This test is only authorized for the duration of time the declaration that circumstances exist justifying the authorization of the emergency use of in vitro diagnostic tests for detection of SARS-CoV-2 virus and/or diagnosis of COVID-19 infection under section 564(b)(1) of the Act, 21 U.S.C. 629BMW-4(X)(3), unless the authorization is terminated or revoked sooner. When diagnostic testing is negative, the possibility of a false negative result should be  considered in the context of a patient's recent exposures and the presence of clinical signs and symptoms consistent with COVID-19. An individual without symptoms of COVID-19 and who is not shedding SARS-CoV-2 virus would expect to have a negative (not detected) result in this assay. Performed  At: [facility] [place], [place] [id number] Rush Farmer MD AT:[id number]    [place]  Final    Comment: Performed at [facility], [address], [place], [place] [address]    Radiology Reports Ct Head Wo Contrast  Result Date: 08/03/2018 CLINICAL DATA:  Dizziness. EXAM: CT HEAD WITHOUT CONTRAST TECHNIQUE: Contiguous axial images were obtained from the base of the skull through the vertex without intravenous contrast. COMPARISON:  None. FINDINGS: Brain: No evidence of acute infarction, hemorrhage, hydrocephalus, extra-axial collection or mass lesion/mass effect. Vascular: Atherosclerotic vascular calcification of the carotid siphons. No hyperdense vessel. Skull: Negative for fracture or focal lesion. Sinuses/Orbits: No acute finding. Other: None. IMPRESSION: 1.  No acute intracranial abnormality.  Electronically Signed   By: Titus Dubin M.D.   On: 08/03/2018 13:37   Mr Jodene Nam Head Wo Contrast  Result Date: 08/03/2018 CLINICAL DATA:  Dizziness since yesterday. History of hypertension, atrial fibrillation, and cardiac arrhythmia. EXAM: MRI HEAD WITHOUT CONTRAST MRA HEAD WITHOUT CONTRAST TECHNIQUE: Multiplanar, multiecho pulse sequences of the brain and surrounding structures were obtained without intravenous contrast. Angiographic images of the head were obtained using MRA technique without contrast. COMPARISON:  CT head earlier today was unremarkable. FINDINGS: MRI HEAD FINDINGS Brain: Small focus of restricted diffusion, corresponding low ADC, RIGHT superior cerebellum, confirmed on axial and coronal DWI, consistent with acute infarct. See series 3, image 70. Elsewhere, no acute stroke, hemorrhage, mass lesion, hydrocephalus, or extra-axial fluid. Paragraph mild cerebral and cerebellar atrophy. Mild subcortical and periventricular T2 and FLAIR hyperintensities, likely chronic microvascular ischemic change. Chronic lacunar infarcts affect the brainstem most notably in the RIGHT paramedian pons, as well as punctate areas of chronic ischemia the elsewhere in the RIGHT cerebellum. Vascular: Reported separately. Skull and upper cervical spine: No acute findings. Bifrontal hyperostosis. Sinuses/Orbits: No significant paranasal sinus disease. BILATERAL cataract extraction, otherwise unremarkable orbits. Other: None. MRA HEAD FINDINGS The internal carotid arteries are widely patent. The basilar artery is widely patent with vertebrals both contributing, LEFT slightly larger. No proximal stenosis of the anterior or middle cerebral arteries. No intracranial saccular aneurysm. RIGHT superior cerebellar artery poorly visualized. Moderately diseased LEFT PICA. Poorly visualized LEFT AICA. LEFT SCA patent. Focal 75% stenosis of the proximal RIGHT posterior cerebral artery P1 segment. IMPRESSION: 3 x 5 mm acute RIGHT  superior cerebellar infarct. Poorly visualized RIGHT superior cerebellar artery could be occluded. Elsewhere, normal for age cerebral volume with mild small vessel disease. No proximal large vessel stenosis or occlusion. Electronically Signed   By: Staci Righter M.D.   On: 08/03/2018 15:10   Mr Brain Wo Contrast (neuro Protocol)  Result Date: 08/03/2018 CLINICAL DATA:  Dizziness since yesterday. History of hypertension, atrial fibrillation, and cardiac arrhythmia. EXAM: MRI HEAD WITHOUT CONTRAST MRA HEAD WITHOUT CONTRAST TECHNIQUE: Multiplanar, multiecho pulse sequences of the brain and surrounding structures were obtained without intravenous contrast. Angiographic images of the head were obtained using MRA technique without contrast. COMPARISON:  CT head earlier today was unremarkable. FINDINGS: MRI HEAD FINDINGS Brain: Small focus of restricted diffusion, corresponding low ADC, RIGHT superior cerebellum, confirmed on axial and coronal DWI, consistent with acute infarct. See series 3, image 70. Elsewhere, no acute stroke, hemorrhage, mass lesion,  hydrocephalus, or extra-axial fluid. Paragraph mild cerebral and cerebellar atrophy. Mild subcortical and periventricular T2 and FLAIR hyperintensities, likely chronic microvascular ischemic change. Chronic lacunar infarcts affect the brainstem most notably in the RIGHT paramedian pons, as well as punctate areas of chronic ischemia the elsewhere in the RIGHT cerebellum. Vascular: Reported separately. Skull and upper cervical spine: No acute findings. Bifrontal hyperostosis. Sinuses/Orbits: No significant paranasal sinus disease. BILATERAL cataract extraction, otherwise unremarkable orbits. Other: None. MRA HEAD FINDINGS The internal carotid arteries are widely patent. The basilar artery is widely patent with vertebrals both contributing, LEFT slightly larger. No proximal stenosis of the anterior or middle cerebral arteries. No intracranial saccular aneurysm. RIGHT  superior cerebellar artery poorly visualized. Moderately diseased LEFT PICA. Poorly visualized LEFT AICA. LEFT SCA patent. Focal 75% stenosis of the proximal RIGHT posterior cerebral artery P1 segment. IMPRESSION: 3 x 5 mm acute RIGHT superior cerebellar infarct. Poorly visualized RIGHT superior cerebellar artery could be occluded. Elsewhere, normal for age cerebral volume with mild small vessel disease. No proximal large vessel stenosis or occlusion. Electronically Signed   By: Staci Righter M.D.   On: 08/03/2018 15:10   US Carotid Bilateral  Result Date: 08/04/2018 CLINICAL DATA:  73 year old female with a history of stroke EXAM: BILATERAL CAROTID DUPLEX ULTRASOUND TECHNIQUE: Pearline Cables scale imaging, color Doppler and duplex ultrasound were performed of bilateral carotid and vertebral arteries in the neck. COMPARISON:  None. FINDINGS: Criteria: Quantification of carotid stenosis is based on velocity parameters that correlate the residual internal carotid diameter with NASCET-based stenosis levels, using the diameter of the distal internal carotid lumen as the denominator for stenosis measurement. The following velocity measurements were obtained: RIGHT ICA:  Systolic 324 cm/sec, Diastolic 31 cm/sec CCA:  83 cm/sec SYSTOLIC ICA/CCA RATIO:  1.3 ECA:  85 cm/sec LEFT ICA:  Systolic 69 cm/sec, Diastolic 24 cm/sec CCA:  64 cm/sec SYSTOLIC ICA/CCA RATIO:  1.1 ECA:  67 cm/sec Right Brachial SBP: Not acquired Left Brachial SBP: Not acquired RIGHT CAROTID ARTERY: No significant calcifications of the right common carotid artery. Intermediate waveform maintained. Heterogeneous and partially calcified plaque at the right carotid bifurcation. No significant lumen shadowing. Low resistance waveform of the right ICA. No significant tortuosity. RIGHT VERTEBRAL ARTERY: Antegrade flow with low resistance waveform. LEFT CAROTID ARTERY: No significant calcifications of the left common carotid artery. Intermediate waveform maintained.  Heterogeneous and partially calcified plaque at the left carotid bifurcation without significant lumen shadowing. Low resistance waveform of the left ICA. No significant tortuosity. LEFT VERTEBRAL ARTERY:  Antegrade flow with low resistance waveform. IMPRESSION: Color duplex indicates minimal heterogeneous and calcified plaque, with no hemodynamically significant stenosis by duplex criteria in the extracranial cerebrovascular circulation. Signed, Dulcy Fanny. Dellia Nims, RPVI [facility] Electronically Signed   By: Corrie Mckusick D.O.   On: 08/04/2018 15:58     CBC Recent Labs  Lab 08/03/18 1119 08/03/18 1253 08/04/18 0511  WBC 5.7  --  6.3  HGB 13.8  --  12.5  HCT 42.2  --  39.2  PLT 159  --  162  MCV 98.4  --  100.0  MCH 32.2  --  31.9  MCHC 32.7  --  31.9  RDW 12.3  --  12.2  LYMPHSABS  --  1.7  --   MONOABS  --  0.4  --   EOSABS  --  0.4  --   BASOSABS  --  0.0  --     Chemistries  Recent Labs  Lab 08/03/18 1119 08/04/18 0511  NA 141 140  K 3.7 4.2  CL 105 105  CO2 24 26  GLUCOSE 118* 121*  BUN 23 29*  CREATININE 0.79 1.35*  CALCIUM 9.1 9.6   ------------------------------------------------------------------------------------------------------------------ Recent Labs    08/04/18 0511  CHOL 134  HDL 35*  LDLCALC 72  TRIG 134  CHOLHDL 3.8    Lab Results  Component Value Date   HGBA1C 5.7 (H) 08/03/2018   ------------------------------------------------------------------------------------------------------------------ Recent Labs    08/03/18 1253  TSH 1.424   ------------------------------------------------------------------------------------------------------------------ No results for input(s): VITAMINB12, FOLATE, FERRITIN, TIBC, IRON, RETICCTPCT in the last 72 hours.  Coagulation profile Recent Labs  Lab 08/03/18 1253  INR 1.0    No results for input(s): DDIMER in the last 72  hours.  Cardiac Enzymes No results for input(s): CKMB, TROPONINI, MYOGLOBIN in the last 168 hours.  Invalid input(s): CK ------------------------------------------------------------------------------------------------------------------ No results found for: BNP   Roxan Hockey M.D on 08/04/2018 at 5:01 PM  Go to [email or web address] - for contact info  [facility] - Office  [phone]

## 2018-08-04 NOTE — Evaluation (Signed)
Physical Therapy Evaluation Patient Details Name: Sabrina Taylor MRN: [id number] DOB: 1945/04/12 Today's Date: 08/04/2018   History of Present Illness  Acute Right Cerebellar CVA----Place patient on telemetry monitored unit, patient with history of questionable paroxysmal A. fib in the past, watch for arrhythmias on telemetry.  Brain MRI/MRA Head showed 3 x 5 mm acute RIGHT superior cerebellar infarct MRA brain Poorly visualized RIGHT superior cerebellar artery could be occluded, telemetry neurology consult appreciated, PTA patient was on aspirin 81 mg and Lipitor 20 mg, add Plavix 75 mg daily , patient will probably need combination of aspirin and Plavix for at least 3 weeks and then probably have to be on Plavix monotherapy after that.. Increase Lipitor to 80 mg daily... Neurology consult from Dr. Merlene Laughter requested.  MRA Head noted with possible right superior cerebellar artery occlusion, no LVO or hemodynamically significant stenosis.  await neurology input..    Clinical Impression  Patient alert and oriented, pleasant, and is agreeable to therapy. Patient near baseline for functional mobility and gait, slightly limited secondary to generalized weakness and fatigue with activity. Pt with equal strength throughout B LE and no complaints of pain or numbness/tingling. Pt with initial unsteadiness with transfers, but improves with time once standing and with RW. Patient initially ambulates without AD demonstrating drifting R and L equally with occasional LOB requiring min assist from therapist or holding onto nearby objects to prevent fall. Pt with much improvement using RW and able to complete 150 feet using RW with SUPV and no unsteadiness noted. Pt requests outpatient PT over HHPT due to past success post back surgery. Patient left up in chair with call bell in lap. Patient will benefit from continued physical therapy in hospital and recommended venue below to increase strength, balance, endurance for  safe ADLs and gait.     Follow Up Recommendations Home health PT;Outpatient PT    Equipment Recommendations  None recommended by PT    Recommendations for Other Services       Precautions / Restrictions Precautions Precautions: Fall Precaution Comments: intermittent dizziness Restrictions Weight Bearing Restrictions: No      Mobility  Bed Mobility Overal bed mobility: Modified Independent                Transfers Overall transfer level: Modified independent Equipment used: None             General transfer comment: Initial unsteadiness, improving with time  Ambulation/Gait Ambulation/Gait assistance: Min assist;Supervision Gait Distance (Feet): 150 Feet Assistive device: Rolling walker (2 wheeled) Gait Pattern/deviations: Decreased step length - right;Decreased step length - left;Decreased stride length;Narrow base of support;Drifts right/left Gait velocity: decreased   General Gait Details: Pt demonstrates narrow BOS with occasional drifting to R and L equally requiring min assist to prevent loss of balance or holding onto nearby obstacles. Pt with improved balance with gait using RW demonstrating no unsteadiness, equal B step length, no drifting or staggering and SUPV  Stairs            Wheelchair Mobility    Modified Rankin (Stroke Patients Only)       Balance Overall balance assessment: Needs assistance Sitting-balance support: No upper extremity supported;Feet supported Sitting balance-Leahy Scale: Good     Standing balance support: During functional activity;No upper extremity supported Standing balance-Leahy Scale: Poor Standing balance comment: poor without AD requiring occasional min assist to prevent loss of balance, fair with RW requiring only SUPV with straightline and turning  Pertinent Vitals/Pain Pain Assessment: No/denies pain    Home Living Family/patient expects to be discharged  to:: Private residence Living Arrangements: Spouse/significant other;Children Available Help at Discharge: Family;Available 24 hours/day Type of Home: House Home Access: Stairs to enter Entrance Stairs-Rails: Left Entrance Stairs-Number of Steps: 4 Home Layout: Laundry or work area in basement(12 steps to basement) Home Equipment: Environmental consultant - 2 wheels;Grab bars - toilet;Grab bars - tub/shower      Prior Function Level of Independence: Independent         Comments: pt and husband babysit their 27 year old twin grandchildren daily     Hand Dominance   Dominant Hand: Right    Extremity/Trunk Assessment   Upper Extremity Assessment Upper Extremity Assessment: Defer to OT evaluation    Lower Extremity Assessment Lower Extremity Assessment: Overall WFL for tasks assessed(B LE grossly 4+/5)    Cervical / Trunk Assessment Cervical / Trunk Assessment: Normal  Communication   Communication: No difficulties  Cognition Arousal/Alertness: Awake/alert Behavior During Therapy: WFL for tasks assessed/performed Overall Cognitive Status: Within Functional Limits for tasks assessed                                        General Comments      Exercises     Assessment/Plan    PT Assessment Patient needs continued PT services  PT Problem List Decreased strength;Decreased activity tolerance;Decreased balance;Decreased mobility       PT Treatment Interventions DME instruction;Gait training;Stair training;Therapeutic activities;Therapeutic exercise;Balance training;Neuromuscular re-education;Patient/family education    PT Goals (Current goals can be found in the Care Plan section)  Acute Rehab PT Goals Patient Stated Goal: return home PT Goal Formulation: With patient Time For Goal Achievement: 08/11/18 Potential to Achieve Goals: Good    Frequency Min 2X/week   Barriers to discharge        Co-evaluation               AM-PAC PT "6 Clicks" Mobility   Outcome Measure Help needed turning from your back to your side while in a flat bed without using bedrails?: None Help needed moving from lying on your back to sitting on the side of a flat bed without using bedrails?: None Help needed moving to and from a bed to a chair (including a wheelchair)?: None Help needed standing up from a chair using your arms (e.g., wheelchair or bedside chair)?: None Help needed to walk in hospital room?: A Little Help needed climbing 3-5 steps with a railing? : A Little 6 Click Score: 22    End of Session Equipment Utilized During Treatment: Gait belt Activity Tolerance: Patient tolerated treatment well Patient left: in chair;with call bell/phone within reach Nurse Communication: Mobility status PT Visit Diagnosis: Unsteadiness on feet (R26.81);Other abnormalities of gait and mobility (R26.89)    Time: 8469-6295 PT Time Calculation (min) (ACUTE ONLY): 17 min   Charges:   PT Evaluation $PT Eval Moderate Complexity: 1 Mod PT Treatments $Gait Training: 8-22 mins        10:06 AM, 08/04/18 Talbot Grumbling, DPT Physical Therapist with [facility] [phone] office

## 2018-08-04 NOTE — Progress Notes (Signed)
Notified t opyd via amion page system of trending low Bps. New orders received. Will continue to monitor.

## 2018-08-04 NOTE — Consult Note (Signed)
[place] A. Merlene Laughter, MD     [email or web address]          Sabrina Taylor is an 73 y.o. female.   ASSESSMENT/PLAN: 1.  Acute vertiginous symptoms due to acute right lacunar infarct involving the superior cerebellar area.  Risk factors hypertension, dyslipidemia and age.  Dual antiplatelet agents are recommended at this time.  Subsequently, aspirin 325 is recommended.  The patient's statin has been increased.  Typical risk factor modifications are recommended including blood pressure control.  Follow-up in the office in 4 to 6 weeks.  2.  Remote history of atrial fibrillation during acute illness   This is a 73 year old right-handed white female who presents with the acute onset of lightheadedness and spinning.  She does report having some gait ataxia.  She denies any diplopia, dysarthria, dysphagia, focal numbness or weakness.  She denies any loss of consciousness.  She denies headaches.  She denies palpitation, chest pain or shortness of breath.  The chart reports a history of paroxysmal atrial fibrillation but the patient tells me she only has had one episode when she had a severe case of constipation after being on opioids after procedure.  She has been on 81 mg aspirin and has been compliant with this.  She is noted to have ptosis and inject Tatian of the right eye.  She is not aware of this.  She has had bilateral cataract procedures.  The review of systems otherwise negative.    GENERAL: This is a pleasant female who is in no acute distress.  HEENT: There is mild ptosis on the right associated with mild injection of the sclera.  ABDOMEN: soft  EXTREMITIES: No edema   BACK: Normal  SKIN: Normal by inspection.    MENTAL STATUS: Alert and oriented -including orientation to her age and the month. Speech, language and cognition are generally intact. Judgment and insight normal.   CRANIAL NERVES: Pupils are equal, round and reactive to light and accomodation; extra  ocular movements are full, there is no significant nystagmus; visual fields are full; upper and lower facial muscles are normal in strength and symmetric, there is no flattening of the nasolabial folds; tongue is midline; uvula is midline; shoulder elevation is normal.  MOTOR: Normal tone, bulk and strength; no pronator drift.  COORDINATION: Left finger to nose shows mild dysmetria and past-pointing, right finger to nose is normal, No rest tremor; no intention tremor; no postural tremor; no bradykinesia.  Rapid alternating movements of the hand are normal.  REFLEXES: Deep tendon reflexes are symmetrical and normal.   SENSATION: Normal to light touch, temperature, and pain.   NIH stroke scale 1.   Blood pressure (!) 174/73, pulse (!) 55, temperature 98.2 F (36.8 C), temperature source Oral, resp. rate 18, height 5\' 8"  (1.727 m), weight 93.4 kg, SpO2 99 %.  Past Medical History:  Diagnosis Date  . DDD (degenerative disc disease)    Low back pain  . First degree AV block   . Hyperlipidemia   . Hypertension   . LBBB (left bundle branch block)   . Obesity   . PAF (paroxysmal atrial fibrillation) ([place])   . PONV (postoperative nausea and vomiting)     Past Surgical History:  Procedure Laterality Date  . APPENDECTOMY  1960  . BACK SURGERY  may 2011  . CATARACT EXTRACTION W/PHACO Right 06/05/2014   Procedure: CATARACT EXTRACTION PHACO AND INTRAOCULAR LENS PLACEMENT (IOC);  Surgeon: Tonny Branch, MD;  Location: [facility];  Service: Ophthalmology;  Laterality: Right;  CDE: 6.42  . CATARACT EXTRACTION W/PHACO Left 06/19/2014   Procedure: CATARACT EXTRACTION PHACO AND INTRAOCULAR LENS PLACEMENT (IOC);  Surgeon: Tonny Branch, MD;  Location: [facility];  Service: Ophthalmology;  Laterality: Left;  CDE 5.61  . CHOLECYSTECTOMY    . COLONOSCOPY  03/25/2011   Procedure: COLONOSCOPY;  Surgeon: Jamesetta So, MD;  Location: [facility];  Service: Gastroenterology;  Laterality: N/A;  . KNEE ARTHROSCOPY   2004, 2008   Right  . TOTAL ABDOMINAL HYSTERECTOMY      Family History  Problem Relation Age of Onset  . Hypertension Mother   . Alzheimer's disease Mother   . Stroke Father   . Coronary artery disease Brother     Social History:  reports that she has never smoked. She has never used smokeless tobacco. She reports that she does not drink alcohol or use drugs.  Allergies:  Allergies  Allergen Reactions  . Penicillins Hives    Did it involve swelling of the face/tongue/throat, SOB, or low BP? No Did it involve sudden or severe rash/hives, skin peeling, or any reaction on the inside of your mouth or nose? Yes-Hives Did you need to seek medical attention at a hospital or doctor's office? Yes When did it last happen?73 years old If all above answers are "NO", may proceed with cephalosporin use.     Medications: Prior to Admission medications   Medication Sig Start Date End Date Taking? Authorizing Provider  aspirin 81 MG EC tablet Take 81 mg by mouth daily.     Yes [provider]  atorvastatin (LIPITOR) 20 MG tablet take 1 tablet by mouth once daily Patient taking differently: Take 20 mg by mouth every evening.  01/17/11  Yes Rothbart, Cristopher Estimable, MD  Calcium Carbonate-Vitamin D (CALCIUM PLUS VITAMIN D) 500-50 MG-UNIT CAPS Take 1 tablet by mouth every evening.    Yes [provider]  Cinnamon 500 MG TABS Take 500 mg by mouth every evening.    Yes [provider]  gabapentin (NEURONTIN) 300 MG capsule Take 600 mg by mouth 2 (two) times daily.  12/19/13  Yes [provider]  Ginger, Zingiber officinalis, (GINGER ROOT PO) Take 1 tablet by mouth every evening.   Yes [provider]  ibuprofen (ADVIL,MOTRIN) 200 MG tablet Take 400 mg by mouth every 6 (six) hours as needed for mild pain.    Yes [provider]  lisinopril (ZESTRIL) 10 MG tablet Take 10 mg by mouth daily.    Yes [provider]  loratadine (CLARITIN) 10 MG  tablet Take 10 mg by mouth daily.   Yes [provider]  Magnesium Chloride (MAGNESIUM DR PO) Take 1 tablet by mouth every evening.   Yes [provider]  metFORMIN (GLUCOPHAGE-XR) 500 MG 24 hr tablet Take 500 mg by mouth every evening.  05/19/18  Yes [provider]  metoprolol tartrate (LOPRESSOR) 25 MG tablet Take 25 mg by mouth every morning. 05/19/18  Yes [provider]  Misc Natural Products (BLACK CHERRY CONCENTRATE PO) Take 1 tablet by mouth every evening.    Yes [provider]  tiZANidine (ZANAFLEX) 4 MG tablet Take 4 mg by mouth at bedtime. 07/24/18  Yes [provider]  TURMERIC PO Take 1 capsule by mouth every evening.    Yes [provider]  vitamin B-12 (CYANOCOBALAMIN) 500 MCG tablet Take 1 tablet by mouth every morning.    Yes [provider]    Scheduled Meds: .  aspirin  81 mg Oral Q breakfast  . atorvastatin  80 mg Oral QPM  . calcium-vitamin D  1 tablet Oral QPM  . clopidogrel  75 mg Oral Daily  . gabapentin  600 mg Oral BID  . heparin  5,000 Units Subcutaneous Q8H  . insulin aspart  0-5 Units Subcutaneous QHS  . insulin aspart  0-9 Units Subcutaneous TID WC  . loratadine  10 mg Oral Daily  . sodium chloride flush  3 mL Intravenous Once  . sodium chloride flush  3 mL Intravenous Q12H  . tiZANidine  4 mg Oral QHS  . vitamin B-12  500 mcg Oral q morning - 10a   Continuous Infusions: . sodium chloride    . sodium chloride     PRN Meds:.sodium chloride, acetaminophen **OR** acetaminophen, albuterol, hydrALAZINE, ondansetron **OR** ondansetron (ZOFRAN) IV, polyethylene glycol, sodium chloride flush, traZODone     Results for orders placed or performed during the hospital encounter of 08/03/18 (from the past 48 hour(s))  Urinalysis, Routine w reflex microscopic     Status: Abnormal   Collection Time: 08/03/18 11:05 AM  Result Value Ref Range   Color, Urine YELLOW YELLOW   APPearance HAZY (A)  CLEAR   Specific Gravity, Urine 1.014 1.005 - 1.030   pH 6.0 5.0 - 8.0   Glucose, UA NEGATIVE NEGATIVE mg/dL   Hgb urine dipstick NEGATIVE NEGATIVE   Bilirubin Urine NEGATIVE NEGATIVE   Ketones, ur NEGATIVE NEGATIVE mg/dL   Protein, ur NEGATIVE NEGATIVE mg/dL   Nitrite NEGATIVE NEGATIVE   Leukocytes,Ua NEGATIVE NEGATIVE    Comment: Performed at [facility], [address], [place], [place] [address]  CBG monitoring, ED     Status: Abnormal   Collection Time: 08/03/18 11:13 AM  Result Value Ref Range   Glucose-Capillary 158 (H) 70 - 99 mg/dL  Basic metabolic panel     Status: Abnormal   Collection Time: 08/03/18 11:19 AM  Result Value Ref Range   Sodium 141 135 - 145 mmol/L   Potassium 3.7 3.5 - 5.1 mmol/L   Chloride 105 98 - 111 mmol/L   CO2 24 22 - 32 mmol/L   Glucose, Bld 118 (H) 70 - 99 mg/dL   BUN 23 8 - 23 mg/dL   Creatinine, Ser 0.79 0.44 - 1.00 mg/dL   Calcium 9.1 8.9 - 10.3 mg/dL   GFR calc non Af Amer >60 >60 mL/min   GFR calc Af Amer >60 >60 mL/min   Anion gap 12 5 - 15    Comment: Performed at [facility], [address] [place], [place] [address]  CBC     Status: None   Collection Time: 08/03/18 11:19 AM  Result Value Ref Range   WBC 5.7 4.0 - 10.5 K/uL   RBC 4.29 3.87 - 5.11 MIL/uL   Hemoglobin 13.8 12.0 - 15.0 g/dL   HCT 42.2 36.0 - 46.0 %   MCV 98.4 80.0 - 100.0 fL   MCH 32.2 26.0 - 34.0 pg   MCHC 32.7 30.0 - 36.0 g/dL   RDW 12.3 11.5 - 15.5 %   Platelets 159 150 - 400 K/uL   nRBC 0.0 0.0 - 0.2 %    Comment: Performed at [facility], [address], [place], [place] [address]  Urine rapid drug screen (hosp performed)     Status: None   Collection Time: 08/03/18 12:19 PM  Result Value Ref Range   Opiates NONE DETECTED NONE DETECTED   Cocaine NONE DETECTED NONE DETECTED  Benzodiazepines NONE DETECTED NONE DETECTED   Amphetamines NONE DETECTED NONE DETECTED   Tetrahydrocannabinol NONE DETECTED NONE DETECTED   Barbiturates NONE DETECTED NONE DETECTED     Comment: (NOTE) DRUG SCREEN FOR MEDICAL PURPOSES ONLY.  IF CONFIRMATION IS NEEDED FOR ANY PURPOSE, NOTIFY LAB WITHIN 5 DAYS. LOWEST DETECTABLE LIMITS FOR URINE DRUG SCREEN Drug Class                     Cutoff (ng/mL) Amphetamine and metabolites    1000 Barbiturate and metabolites    200 Benzodiazepine                 409 Tricyclics and metabolites     300 Opiates and metabolites        300 Cocaine and metabolites        300 THC                            50 Performed at [facility], [address], [place], [place] [address]   Protime-INR     Status: None   Collection Time: 08/03/18 12:53 PM  Result Value Ref Range   Prothrombin Time 13.4 11.4 - 15.2 seconds   INR 1.0 0.8 - 1.2    Comment: (NOTE) INR goal varies based on device and disease states. Performed at [facility], [address], [place], [place] [address]   APTT     Status: None   Collection Time: 08/03/18 12:53 PM  Result Value Ref Range   aPTT 30 24 - 36 seconds    Comment: Performed at [facility], [address], [place], [place] [address]  Differential     Status: None   Collection Time: 08/03/18 12:53 PM  Result Value Ref Range   Neutrophils Relative % 55 %   Neutro Abs 3.1 1.7 - 7.7 K/uL   Lymphocytes Relative 30 %   Lymphs Abs 1.7 0.7 - 4.0 K/uL   Monocytes Relative 8 %   Monocytes Absolute 0.4 0.1 - 1.0 K/uL   Eosinophils Relative 6 %   Eosinophils Absolute 0.4 0.0 - 0.5 K/uL   Basophils Relative 1 %   Basophils Absolute 0.0 0.0 - 0.1 K/uL   Immature Granulocytes 0 %   Abs Immature Granulocytes 0.01 0.00 - 0.07 K/uL    Comment: Performed at [facility], [address], [place], [place] [address]  Hemoglobin A1c     Status: Abnormal   Collection Time: 08/03/18 12:53 PM  Result Value Ref Range   Hgb A1c MFr Bld 5.7 (H) 4.8 - 5.6 %    Comment: (NOTE) Pre diabetes:          5.7%-6.4% Diabetes:              >6.4% Glycemic control for   <7.0% adults with diabetes    Mean Plasma Glucose 116.89  mg/dL    Comment: Performed at [facility], [place] [address], [place], [place] [address]  TSH     Status: None   Collection Time: 08/03/18 12:53 PM  Result Value Ref Range   TSH 1.424 0.350 - 4.500 uIU/mL    Comment: Performed by a 3rd Generation assay with a functional sensitivity of <=0.01 uIU/mL. Performed at [facility], [address] [place], [place] [address]   Novel Coronavirus,NAA,(SEND-OUT TO REF LAB - TAT 24-48 hrs); Hosp Order     Status: None   Collection Time: 08/03/18  3:47 PM  Result Value Ref Range   SARS-CoV-2, NAA NOT DETECTED NOT DETECTED    Comment: (NOTE) This test was developed and its performance characteristics determined by [employer]. This test has not been FDA cleared or approved. This test has been authorized by FDA under an Emergency Use Authorization (EUA). This test is only authorized for the duration of time the declaration that circumstances exist justifying the authorization of the emergency use of in vitro diagnostic tests for detection of SARS-CoV-2 virus and/or diagnosis of COVID-19 infection under section 564(b)(1) of the Act, 21 U.S.C. 025KYH-0(W)(2), unless the authorization is terminated or revoked sooner. When diagnostic testing is negative, the possibility of a false negative result should be considered in the context of a patient's recent exposures and the presence of clinical signs and symptoms consistent with COVID-19. An individual without symptoms of COVID-19 and who is not shedding SARS-CoV-2 virus would expect to have a negative (not detected) result in this assay. Performed  At: [facility] [place], [place] [id number] Rush Farmer MD VO:[phone]    Coronavirus Source NASOPHARYNGEAL     Comment: Performed at [facility], [address], [place], [place] [address]  Glucose, capillary     Status: Abnormal   Collection Time: 08/03/18  9:16 PM  Result Value Ref Range   Glucose-Capillary  101 (H) 70 - 99 mg/dL  Lipid panel     Status: Abnormal   Collection Time: 08/04/18  5:11 AM  Result Value Ref Range   Cholesterol 134 0 - 200 mg/dL   Triglycerides 134 <150 mg/dL   HDL 35 (L) >40 mg/dL   Total CHOL/HDL Ratio 3.8 RATIO   VLDL 27 0 - 40 mg/dL   LDL Cholesterol 72 0 - 99 mg/dL    Comment:        Total Cholesterol/HDL:CHD Risk Coronary Heart Disease Risk Table                     Men   Women  1/2 Average Risk   3.4   3.3  Average Risk       5.0   4.4  2 X Average Risk   9.6   7.1  3 X Average Risk  23.4   11.0        Use the calculated Patient Ratio above and the CHD Risk Table to determine the patient's CHD Risk.        ATP III CLASSIFICATION (LDL):  <100     mg/dL   Optimal  100-129  mg/dL   Near or Above                    Optimal  130-159  mg/dL   Borderline  160-189  mg/dL   High  >190     mg/dL   Very High Performed at [address] [place], [place] [address]   Basic metabolic panel     Status: Abnormal   Collection Time: 08/04/18  5:11 AM  Result Value Ref Range   Sodium 140 135 - 145 mmol/L   Potassium 4.2 3.5 - 5.1 mmol/L   Chloride 105 98 - 111 mmol/L   CO2 26 22 - 32 mmol/L   Glucose, Bld 121 (H) 70 - 99 mg/dL   BUN 29 (H) 8 - 23 mg/dL   Creatinine, Ser 1.35 (H) 0.44 - 1.00 mg/dL   Calcium 9.6 8.9 - 10.3 mg/dL   GFR calc non Af Amer 39 (L) >  60 mL/min   GFR calc Af Amer 45 (L) >60 mL/min   Anion gap 9 5 - 15    Comment: Performed at [facility], [address], [place], [place] [address]  CBC     Status: None   Collection Time: 08/04/18  5:11 AM  Result Value Ref Range   WBC 6.3 4.0 - 10.5 K/uL   RBC 3.92 3.87 - 5.11 MIL/uL   Hemoglobin 12.5 12.0 - 15.0 g/dL   HCT 39.2 36.0 - 46.0 %   MCV 100.0 80.0 - 100.0 fL   MCH 31.9 26.0 - 34.0 pg   MCHC 31.9 30.0 - 36.0 g/dL   RDW 12.2 11.5 - 15.5 %   Platelets 162 150 - 400 K/uL   nRBC 0.0 0.0 - 0.2 %    Comment: Performed at [facility], [address], [place], [place]  [address]  Glucose, capillary     Status: None   Collection Time: 08/04/18  7:21 AM  Result Value Ref Range   Glucose-Capillary 88 70 - 99 mg/dL  Glucose, capillary     Status: None   Collection Time: 08/04/18 11:05 AM  Result Value Ref Range   Glucose-Capillary 96 70 - 99 mg/dL  Glucose, capillary     Status: None   Collection Time: 08/04/18  4:04 PM  Result Value Ref Range   Glucose-Capillary 98 70 - 99 mg/dL    Studies/Results: BRAIN MRI MRA FINDINGS: MRI HEAD FINDINGS  Brain: Small focus of restricted diffusion, corresponding low ADC, RIGHT superior cerebellum, confirmed on axial and coronal DWI, consistent with acute infarct. See series 3, image 70.  Elsewhere, no acute stroke, hemorrhage, mass lesion, hydrocephalus, or extra-axial fluid. Paragraph mild cerebral and cerebellar atrophy. Mild subcortical and periventricular T2 and FLAIR hyperintensities, likely chronic microvascular ischemic change. Chronic lacunar infarcts affect the brainstem most notably in the RIGHT paramedian pons, as well as punctate areas of chronic ischemia the elsewhere in the RIGHT cerebellum.  Vascular: Reported separately.  Skull and upper cervical spine: No acute findings. Bifrontal hyperostosis.  Sinuses/Orbits: No significant paranasal sinus disease. BILATERAL cataract extraction, otherwise unremarkable orbits.  Other: None.  MRA HEAD FINDINGS  The internal carotid arteries are widely patent. The basilar artery is widely patent with vertebrals both contributing, LEFT slightly larger. No proximal stenosis of the anterior or middle cerebral arteries. No intracranial saccular aneurysm.  RIGHT superior cerebellar artery poorly visualized. Moderately diseased LEFT PICA. Poorly visualized LEFT AICA. LEFT SCA patent. Focal 75% stenosis of the proximal RIGHT posterior cerebral artery P1 segment.  IMPRESSION: 3 x 5 mm acute RIGHT superior cerebellar infarct. Poorly visualized  RIGHT superior cerebellar artery could be occluded.  Elsewhere, normal for age cerebral volume with mild small vessel disease.  No proximal large vessel stenosis or occlusion.       The brain MRI and MRA are both reviewed in person.  There is a tiny infarct involving the superior part of the right cerebellum.  This is seen on DWI.  There is mild to moderate deep white matter increased signal seen on FLAIR imaging.  No encephalomalacia is appreciated.  There appears to be poor visualization of the right superior cerebellar artery indicating significant disease.  Other intracranial blood vessels are patent.  No hemorrhages appreciated.    Terrall Bley A. Merlene Laughter, M.D.  Diplomate, Tax adviser of Psychiatry and Neurology ( Neurology). 08/04/2018, 5:31 PM

## 2018-08-04 NOTE — Evaluation (Signed)
Occupational Therapy Evaluation Patient Details Name: Sabrina Taylor MRN: [id number] DOB: April 27, 1945 Today's Date: 08/04/2018    History of Present Illness Acute Right Cerebellar CVA----Place patient on telemetry monitored unit, patient with history of questionable paroxysmal A. fib in the past, watch for arrhythmias on telemetry.  Brain MRI/MRA Head showed 3 x 5 mm acute RIGHT superior cerebellar infarct MRA brain Poorly visualized RIGHT superior cerebellar artery could be occluded, telemetry neurology consult appreciated, PTA patient was on aspirin 81 mg and Lipitor 20 mg, add Plavix 75 mg daily , patient will probably need combination of aspirin and Plavix for at least 3 weeks and then probably have to be on Plavix monotherapy after that.. Increase Lipitor to 80 mg daily... Neurology consult from Dr. Merlene Laughter requested.  MRA Head noted with possible right superior cerebellar artery occlusion, no LVO or hemodynamically significant stenosis.  await neurology input..   Clinical Impression   Pt agreeable to OT evaluation, reports occasional lightheadedness during evaluation. Pt demonstrating BUE strength and coordination WNL, sensation is intact. Pt is performing ADLs at baseline, supervision for standing/functional mobility due to lightheaded feeling. Pt verbalizing strategies for dizziness/lightheadedness including sitting down and not bending over, taking time with transfers. No further OT services required at this time.     Follow Up Recommendations  No OT follow up    Equipment Recommendations  Tub/shower seat       Precautions / Restrictions Precautions Precautions: Fall Precaution Comments: intermittent dizziness Restrictions Weight Bearing Restrictions: No      Mobility Bed Mobility Overal bed mobility: Modified Independent                Transfers Overall transfer level: Modified independent Equipment used: None                      ADL either performed or  assessed with clinical judgement   ADL Overall ADL's : Needs assistance/impaired     Grooming: Wash/dry hands;Oral care;Supervision/safety;Standing               Lower Body Dressing: Modified independent;Sitting/lateral leans               Functional mobility during ADLs: Supervision/safety       Vision Baseline Vision/History: Wears glasses Wears Glasses: At all times Patient Visual Report: No change from baseline Vision Assessment?: No apparent visual deficits            Pertinent Vitals/Pain Pain Assessment: No/denies pain     Hand Dominance Right   Extremity/Trunk Assessment Upper Extremity Assessment Upper Extremity Assessment: Overall WFL for tasks assessed(BUE strength 4/5 )   Lower Extremity Assessment Lower Extremity Assessment: Defer to PT evaluation   Cervical / Trunk Assessment Cervical / Trunk Assessment: Normal   Communication Communication Communication: No difficulties   Cognition Arousal/Alertness: Awake/alert Behavior During Therapy: WFL for tasks assessed/performed Overall Cognitive Status: Within Functional Limits for tasks assessed                                                Home Living Family/patient expects to be discharged to:: Private residence Living Arrangements: Spouse/significant other;Children Available Help at Discharge: Family;Available 24 hours/day Type of Home: House Home Access: Stairs to enter [employer] of Steps: 4 Entrance Stairs-Rails: Left Home Layout: Laundry or work area in basement(12 steps to basement)  Bathroom Shower/Tub: Occupational psychologist: Standard     Home Equipment: Environmental consultant - 2 wheels;Grab bars - toilet;Grab bars - tub/shower          Prior Functioning/Environment Level of Independence: Independent        Comments: pt and husband babysit their 42 year old twin grandchildren daily        OT Problem List: Decreased activity  tolerance       End of Session Equipment Utilized During Treatment: Gait belt  Activity Tolerance: Patient tolerated treatment well Patient left: in chair;with call bell/phone within reach;with chair alarm set  OT Visit Diagnosis: Muscle weakness (generalized) (M62.81)                Time: 2411-4643 OT Time Calculation (min): 22 min Charges:  OT General Charges $OT Visit: 1 Visit OT Evaluation $OT Eval Low Complexity: [place], OTR/L  [phone] 08/04/2018, 8:35 AM

## 2018-08-05 ENCOUNTER — Inpatient Hospital Stay (HOSPITAL_COMMUNITY): Payer: Medicare HMO

## 2018-08-05 DIAGNOSIS — I639 Cerebral infarction, unspecified: Secondary | ICD-10-CM

## 2018-08-05 LAB — BASIC METABOLIC PANEL
Anion gap: 5 (ref 5–15)
BUN: 28 mg/dL — ABNORMAL HIGH (ref 8–23)
CO2: 27 mmol/L (ref 22–32)
Calcium: 9.1 mg/dL (ref 8.9–10.3)
Chloride: 109 mmol/L (ref 98–111)
Creatinine, Ser: 0.91 mg/dL (ref 0.44–1.00)
GFR calc Af Amer: >60 mL/min (ref >60)
GFR calc non Af Amer: >60 mL/min (ref >60)
Glucose, Bld: 116 mg/dL — ABNORMAL HIGH (ref 70–99)
Potassium: 4 mmol/L (ref 3.5–5.1)
Sodium: 141 mmol/L (ref 135–145)

## 2018-08-05 LAB — GLUCOSE, CAPILLARY
Glucose-Capillary: 79 mg/dL (ref 70–99)
Glucose-Capillary: 87 mg/dL (ref 70–99)

## 2018-08-05 LAB — ECHOCARDIOGRAM COMPLETE BUBBLE STUDY
Height: 68 in
Weight: 3294.55 oz

## 2018-08-05 MED ORDER — SODIUM CHLORIDE 0.9 % IV BOLUS
1000.0000 mL | Freq: Once | INTRAVENOUS | Status: AC
  Administered 2018-08-05: 1000 mL via INTRAVENOUS

## 2018-08-05 MED ORDER — CLOPIDOGREL BISULFATE 75 MG PO TABS: 75.0000 mg | ORAL_TABLET | Freq: Every day | ORAL | 11 refills | Status: AC

## 2018-08-05 MED ORDER — ACETAMINOPHEN 325 MG PO TABS: 650.0000 mg | ORAL_TABLET | Freq: Four times a day (QID) | ORAL | 0 refills | Status: DC | PRN

## 2018-08-05 MED ORDER — MECLIZINE HCL 12.5 MG PO TABS: 12.5000 mg | ORAL_TABLET | Freq: Three times a day (TID) | ORAL | 0 refills | Status: AC | PRN

## 2018-08-05 MED ORDER — ATORVASTATIN CALCIUM 80 MG PO TABS: 80.0000 mg | ORAL_TABLET | Freq: Every evening | ORAL | 11 refills | Status: DC

## 2018-08-05 MED ORDER — ASPIRIN 81 MG PO TBEC: 81.0000 mg | DELAYED_RELEASE_TABLET | Freq: Every day | ORAL | 0 refills | Status: AC

## 2018-08-05 MED ORDER — METOPROLOL SUCCINATE ER 25 MG PO TB24: 25.0000 mg | ORAL_TABLET | Freq: Every day | ORAL | 11 refills | Status: AC

## 2018-08-05 MED ORDER — LISINOPRIL 40 MG PO TABS: 40.0000 mg | ORAL_TABLET | Freq: Every day | ORAL | 11 refills | Status: DC

## 2018-08-05 NOTE — Discharge Summary (Signed)
Sabrina Taylor, is a 73 y.o. female  DOB 04/13/1945  MRN [id number].  Admission date:  08/03/2018  Admitting Physician  Roxan Hockey, MD  Discharge Date:  08/05/2018   Primary MD  [name]  Recommendations for primary care physician for things to follow:   1)Follow-up with neurologist Dr-[place]oon[place] Trey Sailors A MD in 3 to 4 weeks at [facility] [facility], [place], [place] [address] Please call phone: [phone] to make neurology appointment 2) take aspirin 81 mg daily along with Plavix 75 mg daily for 4 weeks then after 4 weeks stop the aspirin but continue the Plavix 75 mg daily indefinitely for stroke prevention 3) you are taking aspirin and Plavix which are both blood thinners so please Avoid ibuprofen/Advil/Aleve/Motrin/Goody Powders/Naproxen/BC powders/Meloxicam/Diclofenac/Indomethacin and other Nonsteroidal anti-inflammatory medications as these will make you more likely to bleed and can cause stomach ulcers, can also cause Kidney problems.  4) long-term goal of your blood pressure control is to keep your systolic blood pressure below 654 and your diastolic blood pressure below 84mmhg 5) your metoprolol has been changed to metoprolol succinate/Toprol-XL 25 mg daily 6) your lisinopril has been increased to 40 mg daily 8) your Lipitor/atorvastatin has been increased to 80 mg daily for stroke prevention  Admission Diagnosis  New cerebellar infarct [facility]) [I63.9]   Discharge Diagnosis  New cerebellar infarct ([place]) [I63.9]    Principal Problem:   CVA (cerebral vascular accident)/right superior cerebelar infarct Active Problems:   OBESITY   HLD (hyperlipidemia)   DM (diabetes mellitus), type 2 ([place])      Past Medical History:  Diagnosis Date   DDD (degenerative disc disease)    Low back pain   First degree AV block    Hyperlipidemia    Hypertension    LBBB (left bundle  branch block)    Obesity    PAF (paroxysmal atrial fibrillation) (HCC)    PONV (postoperative nausea and vomiting)     Past Surgical History:  Procedure Laterality Date   APPENDECTOMY  1960   BACK SURGERY  may 2011   CATARACT EXTRACTION W/PHACO Right 06/05/2014   Procedure: CATARACT EXTRACTION PHACO AND INTRAOCULAR LENS PLACEMENT ([place]);  Surgeon: Tonny Branch, MD;  Location: [facility];  Service: Ophthalmology;  Laterality: Right;  CDE: 6.42   CATARACT EXTRACTION W/PHACO Left 06/19/2014   Procedure: CATARACT EXTRACTION PHACO AND INTRAOCULAR LENS PLACEMENT ([facility]);  Surgeon: Tonny Branch, MD;  Location: [facility];  Service: Ophthalmology;  Laterality: Left;  CDE 5.61   CHOLECYSTECTOMY     COLONOSCOPY  03/25/2011   Procedure: COLONOSCOPY;  Surgeon: Jamesetta So, MD;  Location: [facility] SU[facility];  Service: Gastroenterology;  Laterality: N/A;   KNEE ARTHROSCOPY  2004, 2008   Right   TOTAL ABDOMINAL HYSTERECTOMY         HPI  from the history and physical done on the day of admission:    Sabrina Taylor  is a 73 y.o. female with past medical history relevant for HTN, DM 2, and HLD as  well as obesity and status post menopause who presents with complaints of dizziness of more than 24 hours duration... Symptoms apparently started on 08/01/2018.... Dizziness worsened to the point where patient had difficulty with gait and felt like she might pass out,, patient also has some vertigo and the room was spinning around and she felt very lightheaded  At home patient noted that BP was elevated at 204/110, she took an extra dose of lisinopril but BP remains elevated,   Patient had frontal headaches,, no visual disturbance... No chest pains no palpitations, no shortness of breath, no swallowing difficulties, no vomiting no diarrhea no paresthesia no focal extremity weakness per se... Patient does have sensory deficits specifically peripheral neuropathy of both feet which apparently are not new she takes Neurontin  for this  In ED ---Brain MRI  And MRA head showed 3 x 5 mm acute RIGHT superior cerebellar infarct MRA brain Poorly visualized RIGHT superior cerebellar artery could be occluded  Tele- neurology consulted and recommended overnight hospitalization for further neuro work-up    Hospital Course:   Brief Summary 73 year old with past medical history relevant for DM2, HTN and HLD as well as obesity and status post menopause admitted on 08/03/2018 with dizziness and gait problems found to have acute right cerebellar infarct, post admission patient developed AKI with creatinine up to 1.35 from 0.79,  AKI resolved with hydration..... Creatinine is 0.91   A/p 1)AcuteRightCerebellar CVA---- on telemetry monitoredunit no significant arrhythmias so far,patient with history of questionable paroxysmal A. fib in the past (apparently had only one episode during acute illness),  Brain MRI/MRA Headshowed 3 x 5 mm acute RIGHT superior cerebellar infarct MRA brain Poorly visualized RIGHT superior cerebellar artery could be occluded,telemetry neurology consult appreciated, discussed with Dr. Phillips Odor , he advised outpatient neurology follow-up in 3 to 4 weeks, PTA patient was on aspirin 81 mg and Lipitor 20 mg,added Plavix 75 mg daily,patient will need combination of aspirin and Plavix for at least 4 weeks and then probably have to be on Plavix monotherapy after that..Increasd  Lipitor to 80 mg daily... MRA Headnoted with possible right superior cerebellar artery occlusion, no LVO or hemodynamically significant stenosis.  Echocardiogram without intracardiac thrombus and evaluate EF is 55 to 60%, with grade 1 diastolic dysfunction, PT recommends home health PT  patient passed bedside swallow eval, eating and drinking okay, TSH is 1.4, A1c is 5.7,fasting lipid profile with LDL of 72, HDL of 35 and triglycerides of 134 Even if his lipid panel is within desired limits, patient should still take  Lipitor/Statin for it's Pleiotropic effects (beyond cholesterol lowering benefits)  2)DM2--A1c 5.7, reflecting excellent diabetic control, okay to continue low-dose metformin ,   3)HTN---BP is not at goal, initially we allowed some permissive Hypertension due to acute stroke, avoid very aggressive, rapid BP control at this time. Upon discharge okay to increase lisinopril to 40 mg daily and change metoprolol to Toprol-XL 25 mg daily , long-term goal is to keep systolic blood pressure below 093 and diastolic blood pressure below 80  4)AKI-- post admission patient developed AKI with creatinine up to 1.35 from 0.79, had transient hypotension,had resolved with hydration/IV fluids,creatinine is back down to 0.9  Disposition--home with home health PT  Code Status : Full  Family Communication:   Na (patient is alert and coherent)  Consults  :   neurologist Discharge Condition: stable  Fu--- neurologist as advised Diet and Activity recommendation:  As advised  Discharge Instructions    Discharge Instructions  Call MD for:  difficulty breathing, headache or visual disturbances   Complete by: As directed    Call MD for:  persistant dizziness or light-headedness   Complete by: As directed    Call MD for:  persistant nausea and vomiting   Complete by: As directed    Call MD for:  severe uncontrolled pain   Complete by: As directed    Call MD for:  temperature >100.4   Complete by: As directed    Diet - low sodium heart healthy   Complete by: As directed    Diet Carb Modified   Complete by: As directed    Discharge instructions   Complete by: As directed    1)Follow-up with neurologist Dr-Doon[place] Trey Sailors A MD in 3 to 4 weeks at [place] [facility], [place], [place] [address] Please call phone: [phone] to make neurology appointment 2) take aspirin 81 mg daily along with Plavix 75 mg daily for 4 weeks then after 4 weeks stop the aspirin but continue the Plavix 75 mg daily  indefinitely for stroke prevention 3) you are taking aspirin and Plavix which are both blood thinners so please Avoid ibuprofen/Advil/Aleve/Motrin/Goody Powders/Naproxen/BC powders/Meloxicam/Diclofenac/Indomethacin and other Nonsteroidal anti-inflammatory medications as these will make you more likely to bleed and can cause stomach ulcers, can also cause Kidney problems.  4) long-term goal of your blood pressure control is to keep your systolic blood pressure below 626 and your diastolic blood pressure below 15mmhg 5) your metoprolol has been changed to metoprolol succinate/Toprol-XL 25 mg daily 6) your lisinopril has been increased to 40 mg daily 8) your Lipitor/atorvastatin has been increased to 80 mg daily for stroke prevention   Increase activity slowly   Complete by: As directed        Discharge Medications     Allergies as of 08/05/2018      Reactions   Penicillins Hives   Did it involve swelling of the face/tongue/throat, SOB, or low BP? No Did it involve sudden or severe rash/hives, skin peeling, or any reaction on the inside of your mouth or nose? Yes-Hives Did you need to seek medical attention at a hospital or doctor's office? Yes When did it last happen?73 years old If all above answers are NO, may proceed with cephalosporin use.      Medication List    STOP taking these medications   ibuprofen 200 MG tablet Commonly known as: ADVIL   metoprolol tartrate 25 MG tablet Commonly known as: LOPRESSOR     TAKE these medications   acetaminophen 325 MG tablet Commonly known as: TYLENOL Take 2 tablets (650 mg total) by mouth every 6 (six) hours as needed for mild pain, fever or headache (or Fever >/= 101).   aspirin 81 MG EC tablet Take 1 tablet (81 mg total) by mouth daily with breakfast. Along with Plavix for 4 weeks then stop aspirin and take Plavix alone after 4 weeks What changed:   when to take this  additional instructions   atorvastatin 80 MG  tablet Commonly known as: LIPITOR Take 1 tablet (80 mg total) by mouth every evening. For stroke prevention What changed:   medication strength  how much to take  when to take this  additional instructions   BLACK CHERRY CONCENTRATE PO Take 1 tablet by mouth every evening.   Calcium Plus Vitamin D 500-50 MG-UNIT Caps Generic drug: Calcium Carbonate-Vitamin D Take 1 tablet by mouth every evening.   Cinnamon 500 MG Tabs Take 500 mg  by mouth every evening.   clopidogrel 75 MG tablet Commonly known as: PLAVIX Take 1 tablet (75 mg total) by mouth daily. Take along with aspirin 81 mg daily for 4 weeks then stop aspirin and continue Plavix indefinitely Start taking on: August 06, 2018   gabapentin 300 MG capsule Commonly known as: NEURONTIN Take 600 mg by mouth 2 (two) times daily.   GINGER ROOT PO Take 1 tablet by mouth every evening.   lisinopril 40 MG tablet Commonly known as: ZESTRIL Take 1 tablet (40 mg total) by mouth daily. What changed:   medication strength  how much to take   loratadine 10 MG tablet Commonly known as: CLARITIN Take 10 mg by mouth daily.   MAGNESIUM DR PO Take 1 tablet by mouth every evening.   meclizine 12.5 MG tablet Commonly known as: ANTIVERT Take 1 tablet (12.5 mg total) by mouth 3 (three) times daily as needed for dizziness.   metFORMIN 500 MG 24 hr tablet Commonly known as: GLUCOPHAGE-XR Take 500 mg by mouth every evening.   metoprolol succinate 25 MG 24 hr tablet Commonly known as: Toprol XL Take 1 tablet (25 mg total) by mouth daily.   tiZANidine 4 MG tablet Commonly known as: ZANAFLEX Take 4 mg by mouth at bedtime.   TURMERIC PO Take 1 capsule by mouth every evening.   vitamin B-12 500 MCG tablet Commonly known as: CYANOCOBALAMIN Take 1 tablet by mouth every morning.      Major procedures and Radiology Reports - PLEASE review detailed and final reports for all details, in brief -   Ct Head Wo Contrast  Result  Date: 08/03/2018 CLINICAL DATA:  Dizziness. EXAM: CT HEAD WITHOUT CONTRAST TECHNIQUE: Contiguous axial images were obtained from the base of the skull through the vertex without intravenous contrast. COMPARISON:  None. FINDINGS: Brain: No evidence of acute infarction, hemorrhage, hydrocephalus, extra-axial collection or mass lesion/mass effect. Vascular: Atherosclerotic vascular calcification of the carotid siphons. No hyperdense vessel. Skull: Negative for fracture or focal lesion. Sinuses/Orbits: No acute finding. Other: None. IMPRESSION: 1.  No acute intracranial abnormality. Electronically Signed   By: Titus Dubin M.D.   On: 08/03/2018 13:37   Mr Jodene Nam Head Wo Contrast  Result Date: 08/03/2018 CLINICAL DATA:  Dizziness since yesterday. History of hypertension, atrial fibrillation, and cardiac arrhythmia. EXAM: MRI HEAD WITHOUT CONTRAST MRA HEAD WITHOUT CONTRAST TECHNIQUE: Multiplanar, multiecho pulse sequences of the brain and surrounding structures were obtained without intravenous contrast. Angiographic images of the head were obtained using MRA technique without contrast. COMPARISON:  CT head earlier today was unremarkable. FINDINGS: MRI HEAD FINDINGS Brain: Small focus of restricted diffusion, corresponding low ADC, RIGHT superior cerebellum, confirmed on axial and coronal DWI, consistent with acute infarct. See series 3, image 70. Elsewhere, no acute stroke, hemorrhage, mass lesion, hydrocephalus, or extra-axial fluid. Paragraph mild cerebral and cerebellar atrophy. Mild subcortical and periventricular T2 and FLAIR hyperintensities, likely chronic microvascular ischemic change. Chronic lacunar infarcts affect the brainstem most notably in the RIGHT paramedian pons, as well as punctate areas of chronic ischemia the elsewhere in the RIGHT cerebellum. Vascular: Reported separately. Skull and upper cervical spine: No acute findings. Bifrontal hyperostosis. Sinuses/Orbits: No significant paranasal sinus  disease. BILATERAL cataract extraction, otherwise unremarkable orbits. Other: None. MRA HEAD FINDINGS The internal carotid arteries are widely patent. The basilar artery is widely patent with vertebrals both contributing, LEFT slightly larger. No proximal stenosis of the anterior or middle cerebral arteries. No intracranial saccular aneurysm. RIGHT superior cerebellar artery  poorly visualized. Moderately diseased LEFT PICA. Poorly visualized LEFT AICA. LEFT SCA patent. Focal 75% stenosis of the proximal RIGHT posterior cerebral artery P1 segment. IMPRESSION: 3 x 5 mm acute RIGHT superior cerebellar infarct. Poorly visualized RIGHT superior cerebellar artery could be occluded. Elsewhere, normal for age cerebral volume with mild small vessel disease. No proximal large vessel stenosis or occlusion. Electronically Signed   By: Staci Righter M.D.   On: 08/03/2018 15:10   Mr Brain Wo Contrast (neuro Protocol)  Result Date: 08/03/2018 CLINICAL DATA:  Dizziness since yesterday. History of hypertension, atrial fibrillation, and cardiac arrhythmia. EXAM: MRI HEAD WITHOUT CONTRAST MRA HEAD WITHOUT CONTRAST TECHNIQUE: Multiplanar, multiecho pulse sequences of the brain and surrounding structures were obtained without intravenous contrast. Angiographic images of the head were obtained using MRA technique without contrast. COMPARISON:  CT head earlier today was unremarkable. FINDINGS: MRI HEAD FINDINGS Brain: Small focus of restricted diffusion, corresponding low ADC, RIGHT superior cerebellum, confirmed on axial and coronal DWI, consistent with acute infarct. See series 3, image 70. Elsewhere, no acute stroke, hemorrhage, mass lesion, hydrocephalus, or extra-axial fluid. Paragraph mild cerebral and cerebellar atrophy. Mild subcortical and periventricular T2 and FLAIR hyperintensities, likely chronic microvascular ischemic change. Chronic lacunar infarcts affect the brainstem most notably in the RIGHT paramedian pons, as well  as punctate areas of chronic ischemia the elsewhere in the RIGHT cerebellum. Vascular: Reported separately. Skull and upper cervical spine: No acute findings. Bifrontal hyperostosis. Sinuses/Orbits: No significant paranasal sinus disease. BILATERAL cataract extraction, otherwise unremarkable orbits. Other: None. MRA HEAD FINDINGS The internal carotid arteries are widely patent. The basilar artery is widely patent with vertebrals both contributing, LEFT slightly larger. No proximal stenosis of the anterior or middle cerebral arteries. No intracranial saccular aneurysm. RIGHT superior cerebellar artery poorly visualized. Moderately diseased LEFT PICA. Poorly visualized LEFT AICA. LEFT SCA patent. Focal 75% stenosis of the proximal RIGHT posterior cerebral artery P1 segment. IMPRESSION: 3 x 5 mm acute RIGHT superior cerebellar infarct. Poorly visualized RIGHT superior cerebellar artery could be occluded. Elsewhere, normal for age cerebral volume with mild small vessel disease. No proximal large vessel stenosis or occlusion. Electronically Signed   By: Staci Righter M.D.   On: 08/03/2018 15:10   US Carotid Bilateral  Result Date: 08/04/2018 CLINICAL DATA:  73 year old female with a history of stroke EXAM: BILATERAL CAROTID DUPLEX ULTRASOUND TECHNIQUE: Pearline Cables scale imaging, color Doppler and duplex ultrasound were performed of bilateral carotid and vertebral arteries in the neck. COMPARISON:  None. FINDINGS: Criteria: Quantification of carotid stenosis is based on velocity parameters that correlate the residual internal carotid diameter with NASCET-based stenosis levels, using the diameter of the distal internal carotid lumen as the denominator for stenosis measurement. The following velocity measurements were obtained: RIGHT ICA:  Systolic 865 cm/sec, Diastolic 31 cm/sec CCA:  83 cm/sec SYSTOLIC ICA/CCA RATIO:  1.3 ECA:  85 cm/sec LEFT ICA:  Systolic 69 cm/sec, Diastolic 24 cm/sec CCA:  64 cm/sec SYSTOLIC ICA/CCA  RATIO:  1.1 ECA:  67 cm/sec Right Brachial SBP: Not acquired Left Brachial SBP: Not acquired RIGHT CAROTID ARTERY: No significant calcifications of the right common carotid artery. Intermediate waveform maintained. Heterogeneous and partially calcified plaque at the right carotid bifurcation. No significant lumen shadowing. Low resistance waveform of the right ICA. No significant tortuosity. RIGHT VERTEBRAL ARTERY: Antegrade flow with low resistance waveform. LEFT CAROTID ARTERY: No significant calcifications of the left common carotid artery. Intermediate waveform maintained. Heterogeneous and partially calcified plaque at the left carotid bifurcation without significant  lumen shadowing. Low resistance waveform of the left ICA. No significant tortuosity. LEFT VERTEBRAL ARTERY:  Antegrade flow with low resistance waveform. IMPRESSION: Color duplex indicates minimal heterogeneous and calcified plaque, with no hemodynamically significant stenosis by duplex criteria in the extracranial cerebrovascular circulation. Signed, Dulcy Fanny. Dellia Nims, RPVI [facility] Radiology Electronically Signed   By: Corrie Mckusick D.O.   On: 08/04/2018 15:58    Micro Results   Recent Results (from the past 240 hour(s))  Novel Coronavirus,NAA,(SEND-OUT TO REF LAB - TAT 24-48 hrs); Hosp Order     Status: None   Collection Time: 08/03/18  3:47 PM   Specimen: Nasopharyngeal Swab; Respiratory  Result Value Ref Range Status   SARS-CoV-2, NAA NOT DETECTED NOT DETECTED Final    Comment: (NOTE) This test was developed and its performance characteristics determined by [employer]. This test has not been FDA cleared or approved. This test has been authorized by FDA under an Emergency Use Authorization (EUA). This test is only authorized for the duration of time the declaration that circumstances exist justifying the authorization of the emergency use of in vitro diagnostic  tests for detection of SARS-CoV-2 virus and/or diagnosis of COVID-19 infection under section 564(b)(1) of the Act, 21 U.S.C. 544BEE-1(E)(0), unless the authorization is terminated or revoked sooner. When diagnostic testing is negative, the possibility of a false negative result should be considered in the context of a patient's recent exposures and the presence of clinical signs and symptoms consistent with COVID-19. An individual without symptoms of COVID-19 and who is not shedding SARS-CoV-2 virus would expect to have a negative (not detected) result in this assay. Performed  At: [facility] [address] [place], [place] [id number] Rush Farmer MD TG:[id number]    [place]  Final    Comment: Performed at [facility], [address] [place], [place] [address]       Today   Subjective    Jamie Hafford today has no new concerns, some dizziness and gait issues persist due to cerebellar stroke          Patient has been seen and examined prior to discharge   Objective   Blood pressure (!) 149/72, pulse 61, temperature 97.9 F (36.6 C), temperature source Oral, resp. rate 17, height 5\' 8"  (1.727 m), weight 93.4 kg, SpO2 99 %.   Intake/Output Summary (Last 24 hours) at 08/05/2018 1541 Last data filed at 08/05/2018 0900 Gross per 24 hour  Intake 871.52 ml  Output --  Net 871.52 ml    Exam Gen:- Awake Alert,  In no apparent distress  HEENT:- [place].AT, No sclera icterus Neck-Supple Neck,No JVD,.  Lungs-  CTAB , fair symmetrical air movement CV- S1, S2 normal, regular  Abd-  +ve B.Sounds, Abd Soft, No tenderness,    Extremity/Skin:- No  edema, pedal pulses present  Psych-affect is appropriate, oriented x3 Neuro-dizziness and gait concerns continues to improve, no further new focal deficits, no tremors   Data Review   CBC w Diff:  Lab Results  Component Value Date   WBC 6.3 08/04/2018   HGB 12.5 08/04/2018   HCT 39.2 08/04/2018   PLT  162 08/04/2018   LYMPHOPCT 30 08/03/2018   MONOPCT 8 08/03/2018   EOSPCT 6 08/03/2018   BASOPCT 1 08/03/2018    CMP:  Lab Results  Component Value Date   NA 141 08/05/2018   K 4.0 08/05/2018   CL 109 08/05/2018   CO2 27 08/05/2018  BUN 28 (H) 08/05/2018   CREATININE 0.91 08/05/2018   PROT 6.8 04/16/2010   ALBUMIN 4.2 04/16/2010   BILITOT 0.6 04/16/2010   ALKPHOS 50 04/16/2010   AST 17 04/16/2010   ALT 23 04/16/2010  .   Total Discharge time is about 33 minutes  Roxan Hockey M.D on 08/05/2018 at 3:41 PM  Go to [email or web address] -  for contact info  [facility] - Office  [phone]

## 2018-08-05 NOTE — Progress Notes (Addendum)
Patient agreeable to [facility] Choices provided to patient.  Referral made to Glyn Ade with [facility].    Dulce Martian, Clydene Pugh, LCSW

## 2018-08-05 NOTE — Discharge Instructions (Signed)
1)Follow-up with neurologist Dr-Doonquah Trey Sailors A MD in 3 to 4 weeks at [facility], [place], [place] [address] Please call phone: [phone] to make neurology appointment 2) take aspirin 81 mg daily along with Plavix 75 mg daily for 4 weeks then after 4 weeks stop the aspirin but continue the Plavix 75 mg daily indefinitely for stroke prevention 3) you are taking aspirin and Plavix which are both blood thinners so please Avoid ibuprofen/Advil/Aleve/Motrin/Goody Powders/Naproxen/BC powders/Meloxicam/Diclofenac/Indomethacin and other Nonsteroidal anti-inflammatory medications as these will make you more likely to bleed and can cause stomach ulcers, can also cause Kidney problems.  4) long-term goal of your blood pressure control is to keep your systolic blood pressure below 626 and your diastolic blood pressure below 53mmhg 5) your metoprolol has been changed to metoprolol succinate/Toprol-XL 25 mg daily 6) your lisinopril has been increased to 40 mg daily 8) your Lipitor/atorvastatin has been increased to 80 mg daily for stroke prevention    Ischemic Stroke  An ischemic stroke is the sudden death of brain tissue. Blood carries oxygen to all areas of the body. This type of stroke happens when your blood does not flow to your brain like normal. Your brain cannot get the oxygen it needs. This is an emergency. It must be treated right away. Symptoms of a stroke usually happen all of a sudden. You may notice them when you wake up. They can include:  Weakness or loss of feeling in your face, arm, or leg. This often happens on one side of the body.  Trouble walking.  Trouble moving your arms or legs.  Loss of balance or coordination.  Feeling confused.  Trouble talking or understanding what people are saying.  Slurred speech.  Trouble seeing.  Seeing two of one object (double vision).  Feeling dizzy.  Feeling sick to your stomach (nauseous) and throwing up (vomiting).  A  very bad headache for no reason. Get help as soon as any of these problems start. This is important. Some treatments work better if they are given right away. These include:  Aspirin.  Medicines to control blood pressure.  A shot (injection) of medicine to break up the blood clot.  Treatments given in the blood vessel (artery) to take out the clot or break it up. Other treatments may include:  Oxygen.  Fluids given through an IV tube.  Medicines to thin out your blood.  Procedures to help your blood flow better. What increases the risk? Certain things may make you more likely to have a stroke. Some of these are things that you can change, such as:  Being very overweight (obesity).  Smoking.  Taking birth control pills.  Not being active.  Drinking too much alcohol.  Using drugs. Other risk factors include:  High blood pressure.  High cholesterol.  Diabetes.  Heart disease.  Being [place] American, Native American, Hispanic, or [place] Native.  Being over age 45.  Family history of stroke.  Having had blood clots, stroke, or warning stroke (transient ischemic attack, TIA) in the past.  Sickle cell disease.  Being a woman with a history of high blood pressure in pregnancy (preeclampsia).  Migraine headache.  Sleep apnea.  Having an irregular heartbeat (atrial fibrillation).  Long-term (chronic) diseases that cause soreness and swelling (inflammation).  Disorders that affect how your blood clots. Follow these instructions at home: Medicines  Take over-the-counter and prescription medicines only as told by your doctor.  If you were told to take aspirin or another  medicine to thin your blood, take it exactly as told by your doctor. ? Taking too much of the medicine can cause bleeding. ? If you do not take enough, it may not work as well.  Know the side effects of your medicines. If you are taking a blood thinner, make sure you: ? Hold pressure over  any cuts for longer than usual. ? Tell your dentist and other doctors that you take this medicine. ? Avoid activities that may cause damage or injury to your body. Eating and drinking  Follow instructions from your doctor about what you cannot eat or drink.  Eat healthy foods.  If you have trouble with swallowing, do these things to avoid choking: ? Take small bites when eating. ? Eat foods that are soft or pureed. Safety  Follow instructions from your health care team about physical activity.  Use a walker or cane as told by your doctor.  Keep your home safe so you do not fall. This may include: ? Having experts look at your home to make sure it is safe. ? Putting grab bars in the bedroom and bathroom. ? Using raised toilets. ? Putting a seat in the shower. General instructions  Do not use any tobacco products. ? Examples of these are cigarettes, chewing tobacco, and e-cigarettes. ? If you need help quitting, ask your doctor.  Limit how much alcohol you drink. This means no more than 1 drink a day for nonpregnant women and 2 drinks a day for men. One drink equals 12 oz of beer, 5 oz of wine, or 1 oz of hard liquor.  If you need help to stop using drugs or alcohol, ask your doctor to refer you to a program or specialist.  Stay active. Exercise as told by your doctor.  Keep all follow-up visits as told by your doctor. This is important. Get help right away if:   You have any signs of a stroke. "BE FAST" is an easy way to remember the main warning signs: ? B - Balance. Signs are dizziness, sudden trouble walking, or loss of balance. ? E - Eyes. Signs are trouble seeing or a change in how you see. ? F - Face. Signs are sudden weakness or loss of feeling of the face, or the face or eyelid drooping on one side. ? A - Arms. Signs are weakness or loss of feeling in an arm. This happens suddenly and usually on one side of the body. ? S - Speech. Signs are sudden trouble speaking,  slurred speech, or trouble understanding what people say. ? T - Time. Time to call emergency services. Write down what time symptoms started.  You have other signs of a stroke, such as: ? A sudden, very bad headache with no known cause. ? Feeling sick to your stomach (nausea). ? Throwing up (vomiting). ? Jerky movements you cannot control (seizure). These symptoms may be an emergency. Do not wait to see if the symptoms will go away. Get medical help right away. Call your local emergency services (911 in the [place] Do not drive yourself to the hospital. Summary  An ischemic stroke is the sudden death of brain tissue.  Symptoms of a stroke usually happen all of a sudden. You may notice them when you wake up.  Get help if you have any warning signs of a stroke. This is important. Some treatments work better if they are given right away. This information is not intended to replace advice given to you  by your health care provider. Make sure you discuss any questions you have with your health care provider. 1)Follow-up with neurologist Dr-Doonquah Trey Sailors A MD in 3 to 4 weeks at [facility], [place], [place] [address] Please call phone: [phone] to make neurology appointment 2) take aspirin 81 mg daily along with Plavix 75 mg daily for 4 weeks then after 4 weeks stop the aspirin but continue the Plavix 75 mg daily indefinitely for stroke prevention 3) you are taking aspirin and Plavix which are both blood thinners so please Avoid ibuprofen/Advil/Aleve/Motrin/Goody Powders/Naproxen/BC powders/Meloxicam/Diclofenac/Indomethacin and other Nonsteroidal anti-inflammatory medications as these will make you more likely to bleed and can cause stomach ulcers, can also cause Kidney problems.  4) long-term goal of your blood pressure control is to keep your systolic blood pressure below 462 and your diastolic blood pressure below 69mmhg 5) your metoprolol has been changed to metoprolol  succinate/Toprol-XL 25 mg daily 6) your lisinopril has been increased to 40 mg daily 8) your Lipitor/atorvastatin has been increased to 80 mg daily for stroke prevention

## 2018-08-05 NOTE — Progress Notes (Signed)
*  PRELIMINARY RESULTS* Echocardiogram 2D Echocardiogram has been performed with saline bubble study.  Samuel Germany 08/05/2018, 12:13 PM

## 2018-08-05 NOTE — Progress Notes (Signed)
Pt awake and attempting to get up and walk to restroom but states she "feels like the room is spinning." BP stable but has trended down throughout the night. Notified dr Olevia Bowens via [facility] page system. New orders received. Will continue to monitor.

## 2018-08-07 ENCOUNTER — Observation Stay (HOSPITAL_COMMUNITY)
Admission: EM | Admit: 2018-08-07 | Discharge: 2018-08-08 | Disposition: A | Discharge status: Home / Self Care | Payer: Medicare HMO | Attending: Internal Medicine | Admitting: Internal Medicine

## 2018-08-07 ENCOUNTER — Other Ambulatory Visit: Payer: Self-pay

## 2018-08-07 ENCOUNTER — Encounter (HOSPITAL_COMMUNITY): Payer: Self-pay | Admitting: *Deleted

## 2018-08-07 ENCOUNTER — Emergency Department (HOSPITAL_COMMUNITY): Payer: Medicare HMO

## 2018-08-07 DIAGNOSIS — I6502 Occlusion and stenosis of left vertebral artery: Secondary | ICD-10-CM | POA: Diagnosis not present

## 2018-08-07 DIAGNOSIS — I614 Nontraumatic intracerebral hemorrhage in cerebellum: Principal | ICD-10-CM | POA: Insufficient documentation

## 2018-08-07 DIAGNOSIS — I480 Paroxysmal atrial fibrillation: Secondary | ICD-10-CM | POA: Diagnosis not present

## 2018-08-07 DIAGNOSIS — I6503 Occlusion and stenosis of bilateral vertebral arteries: Secondary | ICD-10-CM | POA: Diagnosis not present

## 2018-08-07 DIAGNOSIS — E785 Hyperlipidemia, unspecified: Secondary | ICD-10-CM | POA: Diagnosis not present

## 2018-08-07 DIAGNOSIS — Z20828 Contact with and (suspected) exposure to other viral communicable diseases: Secondary | ICD-10-CM | POA: Insufficient documentation

## 2018-08-07 DIAGNOSIS — I639 Cerebral infarction, unspecified: Secondary | ICD-10-CM | POA: Diagnosis present

## 2018-08-07 DIAGNOSIS — I10 Essential (primary) hypertension: Secondary | ICD-10-CM

## 2018-08-07 DIAGNOSIS — Z79899 Other long term (current) drug therapy: Secondary | ICD-10-CM | POA: Insufficient documentation

## 2018-08-07 DIAGNOSIS — R42 Dizziness and giddiness: Secondary | ICD-10-CM | POA: Diagnosis not present

## 2018-08-07 DIAGNOSIS — Z03818 Encounter for observation for suspected exposure to other biological agents ruled out: Secondary | ICD-10-CM | POA: Diagnosis not present

## 2018-08-07 DIAGNOSIS — Z7984 Long term (current) use of oral hypoglycemic drugs: Secondary | ICD-10-CM | POA: Diagnosis not present

## 2018-08-07 DIAGNOSIS — I6523 Occlusion and stenosis of bilateral carotid arteries: Secondary | ICD-10-CM | POA: Diagnosis not present

## 2018-08-07 DIAGNOSIS — N390 Urinary tract infection, site not specified: Secondary | ICD-10-CM | POA: Insufficient documentation

## 2018-08-07 DIAGNOSIS — E119 Type 2 diabetes mellitus without complications: Secondary | ICD-10-CM | POA: Diagnosis not present

## 2018-08-07 DIAGNOSIS — I6621 Occlusion and stenosis of right posterior cerebral artery: Secondary | ICD-10-CM | POA: Diagnosis not present

## 2018-08-07 HISTORY — DX: Type 2 diabetes mellitus without complications: E11.9

## 2018-08-07 LAB — DIFFERENTIAL
Abs Immature Granulocytes: 0 10*3/uL (ref 0.00–0.07)
Basophils Absolute: 0 10*3/uL (ref 0.0–0.1)
Basophils Relative: 0 %
Eosinophils Absolute: 0.3 10*3/uL (ref 0.0–0.5)
Eosinophils Relative: 6 %
Immature Granulocytes: 0 %
Lymphocytes Relative: 35 %
Lymphs Abs: 1.9 10*3/uL (ref 0.7–4.0)
Monocytes Absolute: 0.5 10*3/uL (ref 0.1–1.0)
Monocytes Relative: 9 %
Neutro Abs: 2.8 10*3/uL (ref 1.7–7.7)
Neutrophils Relative %: 50 %

## 2018-08-07 LAB — CBC
HCT: 38.2 % (ref 36.0–46.0)
Hemoglobin: 12.7 g/dL (ref 12.0–15.0)
MCH: 32 pg (ref 26.0–34.0)
MCHC: 33.2 g/dL (ref 30.0–36.0)
MCV: 96.2 fL (ref 80.0–100.0)
Platelets: 154 10*3/uL (ref 150–400)
RBC: 3.97 MIL/uL (ref 3.87–5.11)
RDW: 12.1 % (ref 11.5–15.5)
WBC: 5.5 10*3/uL (ref 4.0–10.5)
nRBC: 0 % (ref 0.0–0.2)

## 2018-08-07 LAB — COMPREHENSIVE METABOLIC PANEL
ALT: 48 U/L — ABNORMAL HIGH (ref 0–44)
AST: 50 U/L — ABNORMAL HIGH (ref 15–41)
Albumin: 3.7 g/dL (ref 3.5–5.0)
Alkaline Phosphatase: 52 U/L (ref 38–126)
Anion gap: 11 (ref 5–15)
BUN: 22 mg/dL (ref 8–23)
CO2: 23 mmol/L (ref 22–32)
Calcium: 9.1 mg/dL (ref 8.9–10.3)
Chloride: 109 mmol/L (ref 98–111)
Creatinine, Ser: 0.62 mg/dL (ref 0.44–1.00)
GFR calc Af Amer: >60 mL/min (ref >60)
GFR calc non Af Amer: >60 mL/min (ref >60)
Glucose, Bld: 109 mg/dL — ABNORMAL HIGH (ref 70–99)
Potassium: 3.7 mmol/L (ref 3.5–5.1)
Sodium: 143 mmol/L (ref 135–145)
Total Bilirubin: 0.6 mg/dL (ref 0.3–1.2)
Total Protein: 7 g/dL (ref 6.5–8.1)

## 2018-08-07 LAB — URINALYSIS, ROUTINE W REFLEX MICROSCOPIC
Bilirubin Urine: NEGATIVE
Glucose, UA: NEGATIVE mg/dL
Hgb urine dipstick: NEGATIVE
Ketones, ur: NEGATIVE mg/dL
Leukocytes,Ua: NEGATIVE
Nitrite: POSITIVE — AB
Protein, ur: NEGATIVE mg/dL
Specific Gravity, Urine: 1.013 (ref 1.005–1.030)
pH: 7 (ref 5.0–8.0)

## 2018-08-07 LAB — RAPID URINE DRUG SCREEN, HOSP PERFORMED
Amphetamines: NOT DETECTED
Barbiturates: NOT DETECTED
Benzodiazepines: NOT DETECTED
Cocaine: NOT DETECTED
Opiates: NOT DETECTED
Tetrahydrocannabinol: NOT DETECTED

## 2018-08-07 LAB — APTT: aPTT: 29 seconds (ref 24–36)

## 2018-08-07 LAB — ETHANOL: Alcohol, Ethyl (B): <10 mg/dL (ref <10)

## 2018-08-07 LAB — PROTIME-INR
INR: 1 (ref 0.8–1.2)
Prothrombin Time: 13.3 seconds (ref 11.4–15.2)

## 2018-08-07 MED ORDER — IOHEXOL 350 MG/ML SOLN
75.0000 mL | Freq: Once | INTRAVENOUS | Status: AC | PRN
  Administered 2018-08-07: 75 mL via INTRAVENOUS

## 2018-08-07 MED ORDER — HYDROCODONE-ACETAMINOPHEN 5-325 MG PO TABS
1.0000 | ORAL_TABLET | Freq: Once | ORAL | Status: DC
  Filled 2018-08-07: qty 1

## 2018-08-07 MED ORDER — MECLIZINE HCL 12.5 MG PO TABS
25.0000 mg | ORAL_TABLET | Freq: Once | ORAL | Status: AC
  Administered 2018-08-07: 25 mg via ORAL
  Filled 2018-08-07: qty 2

## 2018-08-07 MED ORDER — ACETAMINOPHEN 325 MG PO TABS
650.0000 mg | ORAL_TABLET | Freq: Once | ORAL | Status: AC
  Administered 2018-08-07: 22:00:00 650 mg via ORAL
  Filled 2018-08-07: qty 2

## 2018-08-07 NOTE — ED Triage Notes (Signed)
Pt states she was just discharged from the ED for high blood pressure and a stroke; pt states her meds have been changed and tonight her BP was in the 200/90 at home; pt states she felt dizzy at home and had a mild headache

## 2018-08-07 NOTE — ED Provider Notes (Signed)
[facility] EMERGENCY DEPARTMENT Provider Note   CSN: [id number] Arrival date & time: 08/07/18  2106     History   Chief Complaint Chief Complaint  Patient presents with  . Hypertension    HPI Sabrina Taylor is a 73 y.o. female.      Hypertension    Pt was seen at 2140. Per pt, c/o gradual onset and persistence of constant "high blood pressure" and "dizziness" since discharge from the hospital for same symptoms 2 days ago. Pt was dx with HTN and cerebellar stroke, rx ASA/plavix, and had a change in BP meds. States she was told by the Neurologist that her dizziness was due to her stroke and was rx antivert. Pt states since she has been home, she has been taking her BP at least 3 times per day, every day. States "it's been high." Pt states several hours ago she noted a mild posterior headache and "spinning sensation." States she took her BP and it was "200/90." LD antivert at [employer]. States she has taken all her BP meds as prescribed today. Denies any change in her symptoms since admission/discharge from the hospital. Denies CP/palpitations, no SOB/cough, no abd pain, no N/V/D, no fevers, no rash, no visual changes, no focal motor weakness, no tingling/numbness in extremities, no ataxia, no slurred speech, no facial droop.     Past Medical History:  Diagnosis Date  . DDD (degenerative disc disease)    Low back pain  . First degree AV block   . Hyperlipidemia   . Hypertension   . LBBB (left bundle branch block)   . Obesity   . PAF (paroxysmal atrial fibrillation) ([place])   . PONV (postoperative nausea and vomiting)   . Stroke ([place]) 07/2018  . Vertigo following cerebrovascular accident 07/2018    Patient Active Problem List   Diagnosis Date Noted  . CVA (cerebral vascular accident)/right superior cerebelar infarct 08/03/2018  . HLD (hyperlipidemia) 08/03/2018  . DM (diabetes mellitus), type 2 ([place]) 08/03/2018  . Lumbar radiculopathy 06/06/2016  . Greater trochanteric bursitis  of left hip 02/19/2016  . Injury of left rotator cuff 01/30/2014  . Piriformis syndrome of right side 06/21/2013  . HYPERLIPIDEMIA 02/28/2010  . OBESITY 02/28/2010  . AV BLOCK, 1ST DEGREE 02/28/2010  . LEFT BUNDLE BRANCH BLOCK 02/28/2010  . BACK PAIN, LUMBAR 02/28/2010    Past Surgical History:  Procedure Laterality Date  . APPENDECTOMY  1960  . BACK SURGERY  may 2011  . CATARACT EXTRACTION W/PHACO Right 06/05/2014   Procedure: CATARACT EXTRACTION PHACO AND INTRAOCULAR LENS PLACEMENT (IOC);  Surgeon: Tonny Branch, MD;  Location: [facility] ORS;  Service: Ophthalmology;  Laterality: Right;  CDE: 6.42  . CATARACT EXTRACTION W/PHACO Left 06/19/2014   Procedure: CATARACT EXTRACTION PHACO AND INTRAOCULAR LENS PLACEMENT (IOC);  Surgeon: Tonny Branch, MD;  Location: [facility] ORS;  Service: Ophthalmology;  Laterality: Left;  CDE 5.61  . CHOLECYSTECTOMY    . COLONOSCOPY  03/25/2011   Procedure: COLONOSCOPY;  Surgeon: Jamesetta So, MD;  Location: [facility] ENDO SUITE;  Service: Gastroenterology;  Laterality: N/A;  . KNEE ARTHROSCOPY  2004, 2008   Right  . TOTAL ABDOMINAL HYSTERECTOMY       OB History   No obstetric history on file.      Home Medications    Prior to Admission medications   Medication Sig Start Date End Date Taking? Authorizing Provider  acetaminophen (TYLENOL) 325 MG tablet Take 2 tablets (650 mg total) by mouth every 6 (six) hours as needed  for mild pain, fever or headache (or Fever >/= 101). 08/05/18   Roxan Hockey, MD  aspirin 81 MG EC tablet Take 1 tablet (81 mg total) by mouth daily with breakfast. Along with Plavix for 4 weeks then stop aspirin and take Plavix alone after 4 weeks 08/05/18   Roxan Hockey, MD  atorvastatin (LIPITOR) 80 MG tablet Take 1 tablet (80 mg total) by mouth every evening. For stroke prevention 08/05/18   Roxan Hockey, MD  Calcium Carbonate-Vitamin D (CALCIUM PLUS VITAMIN D) 500-50 MG-UNIT CAPS Take 1 tablet by mouth every evening.     [provider]   Cinnamon 500 MG TABS Take 500 mg by mouth every evening.     [provider]  clopidogrel (PLAVIX) 75 MG tablet Take 1 tablet (75 mg total) by mouth daily. Take along with aspirin 81 mg daily for 4 weeks then stop aspirin and continue Plavix indefinitely 08/06/18   Roxan Hockey, MD  gabapentin (NEURONTIN) 300 MG capsule Take 600 mg by mouth 2 (two) times daily.  12/19/13   [provider]  Ginger, Zingiber officinalis, (GINGER ROOT PO) Take 1 tablet by mouth every evening.    [provider]  lisinopril (ZESTRIL) 40 MG tablet Take 1 tablet (40 mg total) by mouth daily. 08/05/18   Roxan Hockey, MD  loratadine (CLARITIN) 10 MG tablet Take 10 mg by mouth daily.    [provider]  Magnesium Chloride (MAGNESIUM DR PO) Take 1 tablet by mouth every evening.    [provider]  meclizine (ANTIVERT) 12.5 MG tablet Take 1 tablet (12.5 mg total) by mouth 3 (three) times daily as needed for dizziness. 08/05/18   Roxan Hockey, MD  metFORMIN (GLUCOPHAGE-XR) 500 MG 24 hr tablet Take 500 mg by mouth every evening.  05/19/18   [provider]  metoprolol succinate (TOPROL XL) 25 MG 24 hr tablet Take 1 tablet (25 mg total) by mouth daily. 08/05/18 08/05/19  Roxan Hockey, MD  Misc Natural Products (BLACK CHERRY CONCENTRATE PO) Take 1 tablet by mouth every evening.     [provider]  tiZANidine (ZANAFLEX) 4 MG tablet Take 4 mg by mouth at bedtime. 07/24/18   [provider]  TURMERIC PO Take 1 capsule by mouth every evening.     [provider]  vitamin B-12 (CYANOCOBALAMIN) 500 MCG tablet Take 1 tablet by mouth every morning.     [provider]    Family History Family History  Problem Relation Age of Onset  . Hypertension Mother   . Alzheimer's disease Mother   . Stroke Father   . Coronary artery disease Brother     Social History Social History   Tobacco Use  . Smoking status: Never Smoker  .  Smokeless tobacco: Never Used  Substance Use Topics  . Alcohol use: No  . Drug use: No     Allergies   Penicillins   Review of Systems Review of Systems ROS: Statement: All systems negative except as marked or noted in the HPI; Constitutional: Negative for fever and chills. ; ; Eyes: Negative for eye pain, redness and discharge. ; ; ENMT: Negative for ear pain, hoarseness, nasal congestion, sinus pressure and sore throat. ; ; Cardiovascular: Negative for chest pain, palpitations, diaphoresis, dyspnea and peripheral edema. ; ; Respiratory: Negative for cough, wheezing and stridor. ; ; Gastrointestinal: Negative for nausea, vomiting, diarrhea, abdominal pain, blood in stool, hematemesis, jaundice and rectal bleeding. . ; ; Genitourinary: Negative for dysuria, flank pain  and hematuria. ; ; Musculoskeletal: Negative for back pain and neck pain. Negative for swelling and trauma.; ; Skin: Negative for pruritus, rash, abrasions, blisters, bruising and skin lesion.; ; Neuro: +headache, "spinning." Negative for lightheadedness and neck stiffness. Negative for weakness, altered level of consciousness, altered mental status, extremity weakness, paresthesias, involuntary movement, seizure and syncope.       Physical Exam Updated Vital Signs BP (!) 214/96 (BP Location: Right Arm)   Pulse 69   Temp 98.6 F (37 C) (Oral)   Resp 18   Ht 5\' 8"  (1.727 m)   Wt 95.3 kg   SpO2 98%   BMI 31.93 kg/m    Patient Vitals for the past 24 hrs:  BP Temp Temp src Pulse Resp SpO2 Height Weight  08/07/18 2300 (!) 190/68 - - 65 15 98 % - -  08/07/18 2200 (!) 189/77 - - 64 16 97 % - -  08/07/18 2113 - - - - - - 5\' 8"  (1.727 m) 95.3 kg  08/07/18 2111 (!) 214/96 98.6 F (37 C) Oral 69 18 98 % - -     Physical Exam 2145: Physical examination:  Nursing notes reviewed; Vital signs and O2 SAT reviewed;  Constitutional: Well developed, Well nourished, Well hydrated, In no acute distress; Head:  Normocephalic,  atraumatic; Eyes: EOMI, PERRL, No scleral icterus; ENMT: Mouth and pharynx normal, Mucous membranes moist; Neck: Supple, Full range of motion, No lymphadenopathy; Cardiovascular: Regular rate and rhythm, No gallop; Respiratory: Breath sounds clear & equal bilaterally, No wheezes.  Speaking full sentences with ease, Normal respiratory effort/excursion; Chest: Nontender, Movement normal; Abdomen: Soft, Nontender, Nondistended, Normal bowel sounds; Genitourinary: No CVA tenderness; Extremities: Peripheral pulses normal, No tenderness, No edema, No calf edema or asymmetry.; Neuro: AA&Ox3, Major CN grossly intact. Speech clear.  No facial droop. +horizontal nystagmus. Grips equal. Strength 5/5 equal bilat UE's and LE's.  DTR 2/4 equal bilat UE's and LE's.  No gross sensory deficits.  Normal cerebellar testing RUE (finger-nose) and bilat LE's (heel-shin), mild ataxia LUE (present during last admission, per Neuro MD notes)..; Skin: Color normal, Warm, Dry.; Psych:  Anxious.    ED Treatments / Results  Labs (all labs ordered are listed, but only abnormal results are displayed)   EKG EKG Interpretation  Date/Time:  Saturday August 07 2018 21:59:03 EDT Ventricular Rate:  65 PR Interval:    QRS Duration: 149 QT Interval:  501 QTC Calculation: 521 R Axis:   -79 Text Interpretation:  Sinus rhythm Probable left atrial enlargement Left bundle branch block Baseline wander When compared with ECG of 08/03/2018 No significant change was found Confirmed by Francine Graven [phone]) on 08/07/2018 10:10:57 PM   Radiology   Procedures Procedures (including critical care time)  Medications Ordered in ED Medications  meclizine (ANTIVERT) tablet 25 mg (has no administration in time range)  HYDROcodone-acetaminophen (NORCO/VICODIN) 5-325 MG per tablet 1 tablet (has no administration in time range)     Initial Impression / Assessment and Plan / ED Course  I have reviewed the triage vital signs and the nursing  notes.  Pertinent labs & imaging results that were available during my care of the patient were reviewed by me and considered in my medical decision making (see chart for details).     MDM Reviewed: previous chart, nursing note and vitals Reviewed previous: labs, ECG and MRI Interpretation: labs, ECG and CT scan    Results for orders placed or performed during the hospital encounter of 08/07/18  Ethanol  Result Value Ref Range   Alcohol, Ethyl (B) <10 <10 mg/dL  Protime-INR  Result Value Ref Range   Prothrombin Time 13.3 11.4 - 15.2 seconds   INR 1.0 0.8 - 1.2  APTT  Result Value Ref Range   aPTT 29 24 - 36 seconds  CBC  Result Value Ref Range   WBC 5.5 4.0 - 10.5 K/uL   RBC 3.97 3.87 - 5.11 MIL/uL   Hemoglobin 12.7 12.0 - 15.0 g/dL   HCT 38.2 36.0 - 46.0 %   MCV 96.2 80.0 - 100.0 fL   MCH 32.0 26.0 - 34.0 pg   MCHC 33.2 30.0 - 36.0 g/dL   RDW 12.1 11.5 - 15.5 %   Platelets 154 150 - 400 K/uL   nRBC 0.0 0.0 - 0.2 %  Differential  Result Value Ref Range   Neutrophils Relative % 50 %   Neutro Abs 2.8 1.7 - 7.7 K/uL   Lymphocytes Relative 35 %   Lymphs Abs 1.9 0.7 - 4.0 K/uL   Monocytes Relative 9 %   Monocytes Absolute 0.5 0.1 - 1.0 K/uL   Eosinophils Relative 6 %   Eosinophils Absolute 0.3 0.0 - 0.5 K/uL   Basophils Relative 0 %   Basophils Absolute 0.0 0.0 - 0.1 K/uL   Immature Granulocytes 0 %   Abs Immature Granulocytes 0.00 0.00 - 0.07 K/uL  Comprehensive metabolic panel  Result Value Ref Range   Sodium 143 135 - 145 mmol/L   Potassium 3.7 3.5 - 5.1 mmol/L   Chloride 109 98 - 111 mmol/L   CO2 23 22 - 32 mmol/L   Glucose, Bld 109 (H) 70 - 99 mg/dL   BUN 22 8 - 23 mg/dL   Creatinine, Ser 0.62 0.44 - 1.00 mg/dL   Calcium 9.1 8.9 - 10.3 mg/dL   Total Protein 7.0 6.5 - 8.1 g/dL   Albumin 3.7 3.5 - 5.0 g/dL   AST 50 (H) 15 - 41 U/L   ALT 48 (H) 0 - 44 U/L   Alkaline Phosphatase 52 38 - 126 U/L   Total Bilirubin 0.6 0.3 - 1.2 mg/dL   GFR calc non Af Amer  >60 >60 mL/min   GFR calc Af Amer >60 >60 mL/min   Anion gap 11 5 - 15  Urine rapid drug screen (hosp performed)not at [facility]  Result Value Ref Range   Opiates NONE DETECTED NONE DETECTED   Cocaine NONE DETECTED NONE DETECTED   Benzodiazepines NONE DETECTED NONE DETECTED   Amphetamines NONE DETECTED NONE DETECTED   Tetrahydrocannabinol NONE DETECTED NONE DETECTED   Barbiturates NONE DETECTED NONE DETECTED  Urinalysis, Routine w reflex microscopic  Result Value Ref Range   Color, Urine YELLOW YELLOW   APPearance CLEAR CLEAR   Specific Gravity, Urine 1.013 1.005 - 1.030   pH 7.0 5.0 - 8.0   Glucose, UA NEGATIVE NEGATIVE mg/dL   Hgb urine dipstick NEGATIVE NEGATIVE   Bilirubin Urine NEGATIVE NEGATIVE   Ketones, ur NEGATIVE NEGATIVE mg/dL   Protein, ur NEGATIVE NEGATIVE mg/dL   Nitrite POSITIVE (A) NEGATIVE   Leukocytes,Ua NEGATIVE NEGATIVE   RBC / HPF 0-5 0 - 5 RBC/hpf   WBC, UA 0-5 0 - 5 WBC/hpf   Bacteria, UA MANY (A) NONE SEEN   Squamous Epithelial / LPF 0-5 0 - 5     2300:  Symptoms and neuro exam today seem unchanged from admission notes. Pt's BP meds changed as of a few days ago. PO antivert and APAP given  while in the ED. BP improving. +UTI on Udip; UC pending. CTA pending; pt will need Tele Neuro MD consult after results. Sign out to Dr. Laverta Baltimore.      Final Clinical Impressions(s) / ED Diagnoses   Final diagnoses:  None    ED Discharge Orders    None       Francine Graven, DO 08/07/18 2309

## 2018-08-08 ENCOUNTER — Observation Stay (HOSPITAL_COMMUNITY): Payer: Medicare HMO

## 2018-08-08 DIAGNOSIS — I10 Essential (primary) hypertension: Secondary | ICD-10-CM | POA: Diagnosis not present

## 2018-08-08 DIAGNOSIS — N390 Urinary tract infection, site not specified: Secondary | ICD-10-CM | POA: Diagnosis not present

## 2018-08-08 DIAGNOSIS — I480 Paroxysmal atrial fibrillation: Secondary | ICD-10-CM | POA: Diagnosis not present

## 2018-08-08 DIAGNOSIS — I639 Cerebral infarction, unspecified: Secondary | ICD-10-CM | POA: Diagnosis present

## 2018-08-08 DIAGNOSIS — Z79899 Other long term (current) drug therapy: Secondary | ICD-10-CM | POA: Diagnosis not present

## 2018-08-08 DIAGNOSIS — Z20828 Contact with and (suspected) exposure to other viral communicable diseases: Secondary | ICD-10-CM | POA: Diagnosis not present

## 2018-08-08 DIAGNOSIS — E119 Type 2 diabetes mellitus without complications: Secondary | ICD-10-CM | POA: Diagnosis not present

## 2018-08-08 DIAGNOSIS — Z7984 Long term (current) use of oral hypoglycemic drugs: Secondary | ICD-10-CM | POA: Diagnosis not present

## 2018-08-08 DIAGNOSIS — E785 Hyperlipidemia, unspecified: Secondary | ICD-10-CM | POA: Diagnosis not present

## 2018-08-08 DIAGNOSIS — I614 Nontraumatic intracerebral hemorrhage in cerebellum: Secondary | ICD-10-CM | POA: Diagnosis not present

## 2018-08-08 DIAGNOSIS — R42 Dizziness and giddiness: Secondary | ICD-10-CM | POA: Diagnosis not present

## 2018-08-08 LAB — GLUCOSE, CAPILLARY
Glucose-Capillary: 92 mg/dL (ref 70–99)
Glucose-Capillary: 151 mg/dL — ABNORMAL HIGH (ref 70–99)
Glucose-Capillary: 85 mg/dL (ref 70–99)

## 2018-08-08 MED ORDER — ASPIRIN EC 81 MG PO TBEC
81.0000 mg | DELAYED_RELEASE_TABLET | Freq: Every day | ORAL | Status: DC
  Administered 2018-08-08: 81 mg via ORAL
  Filled 2018-08-08: qty 1

## 2018-08-08 MED ORDER — INSULIN ASPART 100 UNIT/ML ~~LOC~~ SOLN
0.0000 [IU] | Freq: Three times a day (TID) | SUBCUTANEOUS | Status: DC
  Administered 2018-08-08: 3 [IU] via SUBCUTANEOUS

## 2018-08-08 MED ORDER — ENOXAPARIN SODIUM 40 MG/0.4ML ~~LOC~~ SOLN
40.0000 mg | SUBCUTANEOUS | Status: DC
  Administered 2018-08-08: 40 mg via SUBCUTANEOUS
  Filled 2018-08-08: qty 0.4

## 2018-08-08 MED ORDER — LISINOPRIL 10 MG PO TABS: 10.0000 mg | ORAL_TABLET | Freq: Every day | ORAL | 11 refills | Status: DC

## 2018-08-08 MED ORDER — TIZANIDINE HCL 4 MG PO TABS: 4.0000 mg | ORAL_TABLET | Freq: Every day | ORAL | Status: DC

## 2018-08-08 MED ORDER — STROKE: EARLY STAGES OF RECOVERY BOOK
Freq: Once | Status: AC
  Administered 2018-08-08: 11:00:00
  Filled 2018-08-08 (×2): qty 1

## 2018-08-08 MED ORDER — LORAZEPAM 2 MG/ML IJ SOLN
0.5000 mg | Freq: Once | INTRAMUSCULAR | Status: AC
  Administered 2018-08-08: 08:00:00 0.5 mg via INTRAVENOUS
  Filled 2018-08-08: qty 1

## 2018-08-08 MED ORDER — ACETAMINOPHEN 160 MG/5ML PO SOLN: 650.0000 mg | ORAL | Status: DC | PRN

## 2018-08-08 MED ORDER — CLOPIDOGREL BISULFATE 75 MG PO TABS
75.0000 mg | ORAL_TABLET | Freq: Every day | ORAL | Status: DC
  Administered 2018-08-08: 75 mg via ORAL
  Filled 2018-08-08: qty 1

## 2018-08-08 MED ORDER — ATORVASTATIN CALCIUM 40 MG PO TABS: 40.0000 mg | ORAL_TABLET | Freq: Every day | ORAL | 1 refills | Status: AC

## 2018-08-08 MED ORDER — ACETAMINOPHEN 325 MG PO TABS: 650.0000 mg | ORAL_TABLET | ORAL | Status: DC | PRN

## 2018-08-08 MED ORDER — MECLIZINE HCL 12.5 MG PO TABS: 12.5000 mg | ORAL_TABLET | Freq: Three times a day (TID) | ORAL | Status: DC | PRN

## 2018-08-08 MED ORDER — GABAPENTIN 300 MG PO CAPS
600.0000 mg | ORAL_CAPSULE | Freq: Two times a day (BID) | ORAL | Status: DC
  Administered 2018-08-08: 600 mg via ORAL
  Filled 2018-08-08: qty 2

## 2018-08-08 MED ORDER — ACETAMINOPHEN 650 MG RE SUPP: 650.0000 mg | RECTAL | Status: DC | PRN

## 2018-08-08 MED ORDER — LORATADINE 10 MG PO TABS
10.0000 mg | ORAL_TABLET | Freq: Every day | ORAL | Status: DC
  Administered 2018-08-08: 10 mg via ORAL
  Filled 2018-08-08: qty 1

## 2018-08-08 MED ORDER — ATORVASTATIN CALCIUM 80 MG PO TABS
80.0000 mg | ORAL_TABLET | Freq: Every evening | ORAL | Status: DC
  Administered 2018-08-08: 80 mg via ORAL
  Filled 2018-08-08: qty 1

## 2018-08-08 MED ORDER — METFORMIN HCL ER 500 MG PO TB24
500.0000 mg | ORAL_TABLET | Freq: Every evening | ORAL | Status: DC
  Administered 2018-08-08: 500 mg via ORAL
  Filled 2018-08-08: qty 1

## 2018-08-08 NOTE — ED Notes (Signed)
Pt ambulating to restroom with walker.

## 2018-08-08 NOTE — Evaluation (Signed)
Speech Language Pathology Evaluation Patient Details Name: Sabrina Taylor MRN: [id number] DOB: [birth year] Today's Date: 08/08/2018 Time: 1240-1300 SLP Time Calculation (min) (ACUTE ONLY): 20 min  Problem List:  Patient Active Problem List   Diagnosis Date Noted  . Cerebellar stroke ([place]) 08/08/2018  . PAF (paroxysmal atrial fibrillation) ([place])   . Hypertension   . CVA (cerebral vascular accident)/right superior cerebelar infarct 08/03/2018  . HLD (hyperlipidemia) 08/03/2018  . DM (diabetes mellitus), type 2 ([place]) 08/03/2018  . Lumbar radiculopathy 06/06/2016  . Greater trochanteric bursitis of left hip 02/19/2016  . Injury of left rotator cuff 01/30/2014  . Piriformis syndrome of right side 06/21/2013  . HYPERLIPIDEMIA 02/28/2010  . OBESITY 02/28/2010  . AV BLOCK, 1ST DEGREE 02/28/2010  . LEFT BUNDLE BRANCH BLOCK 02/28/2010  . BACK PAIN, LUMBAR 02/28/2010   Past Medical History:  Past Medical History:  Diagnosis Date  . DDD (degenerative disc disease)    Low back pain  . Diabetes mellitus without complication ([place])   . First degree AV block   . Hyperlipidemia   . Hypertension   . LBBB (left bundle branch block)   . Obesity   . PAF (paroxysmal atrial fibrillation) ([place])   . PONV (postoperative nausea and vomiting)   . Stroke ([place]) 07/2018  . Vertigo following cerebrovascular accident 07/2018   Past Surgical History:  Past Surgical History:  Procedure Laterality Date  . APPENDECTOMY  1960  . BACK SURGERY  may 2011  . CATARACT EXTRACTION W/PHACO Right 06/05/2014   Procedure: CATARACT EXTRACTION PHACO AND INTRAOCULAR LENS PLACEMENT (IOC);  Surgeon: Tonny Branch, MD;  Location: [facility];  Service: Ophthalmology;  Laterality: Right;  CDE: 6.42  . CATARACT EXTRACTION W/PHACO Left 06/19/2014   Procedure: CATARACT EXTRACTION PHACO AND INTRAOCULAR LENS PLACEMENT (IOC);  Surgeon: Tonny Branch, MD;  Location: [facility];  Service: Ophthalmology;  Laterality: Left;  CDE 5.61  .  CHOLECYSTECTOMY    . COLONOSCOPY  03/25/2011   Procedure: COLONOSCOPY;  Surgeon: Jamesetta So, MD;  Location: [facility];  Service: Gastroenterology;  Laterality: N/A;  . KNEE ARTHROSCOPY  2004, 2008   Right  . TOTAL ABDOMINAL HYSTERECTOMY     HPI:  Sabrina Taylor is a 73 y.o. female with medical history significant of DDD, type 2 diabetes, first-degree AV block, hyperlipidemia, hypertension, LBBB, obesity, PAF, history of recent CVA who was just discharged on Thursday and returns to the emergency department due to recurrence of the dizziness similar to what she had during her stroke initial presentation.  She denies headache, blurred vision, dysarthria, aphasia, weakness or changes in sensorium.  No fever, chills, sore throat, rhinorrhea, wheezing, hemoptysis, chest pain, dyspnea, palpitations, diaphoresis, PND, orthopnea or pitting edema of the lower extremities.  Denies abdominal pain, nausea or emesis, diarrhea or constipation, melena or hematochezia.  No dysuria, frequency or hematuria.  No polyuria, polydipsia, polyphagia or blurred vision.  Most recent MRI was showing no acute intracranial abnormality with near complete resolution of diffusion signal abnormality associated with the now subacute subcentimeter right cerebellar infarct.     Assessment / Plan / Recommendation Clinical Impression  Cognitive/linguistic and motor speech evaluation were completed.  Cranial nerve exam was completed and unremarkable.  Lingual, labial, facial and jaw range of motion and strength were judged to be adequate.  Sensation appeared to be intact.  Speech was clear and easy to understand. No discernible dysarthria or apraxia was noted.  She achieved a 28 out of apossible 28 points on the  Mini Mental State Exam (ie the writing and copying portions were not administered this date).  She was oriented to person, place, time and situation.  She had good attention to task.  Her immediate and delayed recall of three  novel words was good.  Language skills appeared to be intact.  Speech was fluent and she was able to easily describe the events that led to her hospital stay.  She was also able to easily provide logical solutions to simple problems and perform simple math problems in her head quickly.  Given this ST follow up is not indicated.  She was encouraged to follow up with her GP to request ST services if she had any issues managing tasks she easily completed before the CVA.  If we can be of further assistance please feel free to reconsult.      SLP Assessment  SLP Recommendation/Assessment: Patient does not need any further Speech Lanaguage Pathology Services    Follow Up Recommendations  None          SLP Evaluation Cognition  Overall Cognitive Status: Within Functional Limits for tasks assessed Arousal/Alertness: Awake/alert Orientation Level: Oriented X4 Attention: Focused Focused Attention: Appears intact Memory: Appears intact Awareness: Appears intact Problem Solving: Appears intact Safety/Judgment: Appears intact       Comprehension  Auditory Comprehension Overall Auditory Comprehension: Appears within functional limits for tasks assessed Commands: Within Functional Limits Conversation: Complex Reading Comprehension Reading Status: Within funtional limits    Expression Expression Primary Mode of Expression: Verbal Verbal Expression Overall Verbal Expression: Appears within functional limits for tasks assessed Initiation: No impairment Automatic Speech: Name;Social Response Level of Generative/Spontaneous Verbalization: Conversation Repetition: No impairment Naming: No impairment Pragmatics: No impairment Non-Verbal Means of Communication: Not applicable Written Expression Dominant Hand: Right Written Expression: Not tested   Oral / Motor  Oral Motor/Sensory Function Overall Oral Motor/Sensory Function: Within functional limits Motor Speech Overall Motor Speech:  Appears within functional limits for tasks assessed Respiration: Within functional limits Phonation: Normal Resonance: Within functional limits Articulation: Within functional limitis Intelligibility: Intelligible Motor Planning: Witnin functional limits Motor Speech Errors: Not applicable   GO                    Shelly Flatten, MA, CCC-SLP Acute Rehab SLP [phone]  Lamar Sprinkles 08/08/2018, 1:13 PM

## 2018-08-08 NOTE — Progress Notes (Signed)
AVS reviewed with patient and patient given a copy. Patient dressed, all lines removed, and belongings packed. Patient taken via wheelchair by nurse to husband's car for discharge.

## 2018-08-08 NOTE — ED Provider Notes (Signed)
Blood pressure (!) 195/89, pulse 73, temperature 98.6 F (37 C), temperature source Oral, resp. rate 15, height 5\' 8"  (1.727 m), weight 95.3 kg, SpO2 96 %.  Assuming care from Dr. Elise Benne.  In short, Sabrina Taylor is a 73 y.o. female with a chief complaint of Hypertension .  Refer to the original H&P for additional details.  The current plan of care is to follow up with CTA and Tele-Neuro recommendations.  01:44 PM  Spoke with tele-neurology regarding the patient's presentation, imaging, current blood pressures.  Plan to obtain repeat MRI to make sure stroke has not extended beyond its initial days.  No acute blood pressure management.  Blood pressure goal would be to keep systolic less than 294.  Patient is not requiring medication to keep her below this number at this time.  Continue to allow for permissive hypertension and MRI in the AM.    Discussed patient's case with Hospitalist, Dr. Olevia Bowens to request admission. Patient and family (if present) updated with plan. Care transferred to Hospitalist service.  I reviewed all nursing notes, vitals, pertinent old records, EKGs, labs, imaging (as available).      Margette Fast, MD 08/08/18 [phone]

## 2018-08-08 NOTE — H&P (Signed)
History and Physical    Sabrina Taylor JSE:[id number] DOB: 03-Jun-1945 DOA: 08/07/2018  PCP: [place], [place]   Patient coming from: Home.  I have personally briefly reviewed patient's old medical records in [place]  Chief Complaint: Dizziness and high blood pressure.  HPI: Sabrina Taylor is a 73 y.o. female with medical history significant of DDD, type 2 diabetes, first-degree AV block, hyperlipidemia, hypertension, LBBB, obesity, PAF, history of recent CVA who was just discharged on Thursday and returns to the emergency department due to recurrence of the dizziness similar to what she had during her stroke initial presentation.  She denies headache, blurred vision, dysarthria, aphasia, weakness or changes in sensorium.  No fever, chills, sore throat, rhinorrhea, wheezing, hemoptysis, chest pain, dyspnea, palpitations, diaphoresis, PND, orthopnea or pitting edema of the lower extremities.  Denies abdominal pain, nausea or emesis, diarrhea or constipation, melena or hematochezia.  No dysuria, frequency or hematuria.  No polyuria, polydipsia, polyphagia or blurred vision.  ED Course: Initial vital signs temperature 98.6 F, pulse 69, respiration 18, blood pressure 214/96 mmHg and O2 sat 98% on room air.  Urinalysis was positive for nitrites and had many bacteria.  UDS was negative.  CBC was normal.  PT/INR and APTT within normal limits.  CMP shows a glucose of 109 mg/dL, and an very minimal elevation of transaminases.  Alcohol level was normal.  CT head showed previous known CVA, but no new areas of ischemia.  Please see images and full radiology report for further detail.  Review of Systems: As per HPI otherwise 10 point review of systems negative.   Past Medical History:  Diagnosis Date   DDD (degenerative disc disease)    Low back pain   Diabetes mellitus without complication (HCC)    First degree AV block    Hyperlipidemia    Hypertension    LBBB (left  bundle branch block)    Obesity    PAF (paroxysmal atrial fibrillation) (HCC)    PONV (postoperative nausea and vomiting)    Stroke ([place]) 07/2018   Vertigo following cerebrovascular accident 07/2018    Past Surgical History:  Procedure Laterality Date   APPENDECTOMY  1960   BACK SURGERY  may 2011   CATARACT EXTRACTION W/PHACO Right 06/05/2014   Procedure: CATARACT EXTRACTION PHACO AND INTRAOCULAR LENS PLACEMENT ([place]);  Surgeon: Tonny Branch, MD;  Location: [facility];  Service: Ophthalmology;  Laterality: Right;  CDE: 6.42   CATARACT EXTRACTION W/PHACO Left 06/19/2014   Procedure: CATARACT EXTRACTION PHACO AND INTRAOCULAR LENS PLACEMENT ([facility]);  Surgeon: Tonny Branch, MD;  Location: [facility];  Service: Ophthalmology;  Laterality: Left;  CDE 5.61   CHOLECYSTECTOMY     COLONOSCOPY  03/25/2011   Procedure: COLONOSCOPY;  Surgeon: Jamesetta So, MD;  Location: [facility];  Service: Gastroenterology;  Laterality: N/A;   KNEE ARTHROSCOPY  2004, 2008   Right   TOTAL ABDOMINAL HYSTERECTOMY       reports that she has never smoked. She has never used smokeless tobacco. She reports that she does not drink alcohol or use drugs.  Allergies  Allergen Reactions   Penicillins Hives    Did it involve swelling of the face/tongue/throat, SOB, or low BP? No Did it involve sudden or severe rash/hives, skin peeling, or any reaction on the inside of your mouth or nose? Yes-Hives Did you need to seek medical attention at a hospital or doctor's office? Yes When did it last happen?73 years old If all above answers  are NO, may proceed with cephalosporin use.     Family History  Problem Relation Age of Onset   Hypertension Mother    Alzheimer's disease Mother    Stroke Father    Coronary artery disease Brother    Prior to Admission medications   Medication Sig Start Date End Date Taking? Authorizing Provider  acetaminophen (TYLENOL) 325 MG tablet Take 2 tablets (650 mg total) by mouth  every 6 (six) hours as needed for mild pain, fever or headache (or Fever >/= 101). 08/05/18   Roxan Hockey, MD  aspirin 81 MG EC tablet Take 1 tablet (81 mg total) by mouth daily with breakfast. Along with Plavix for 4 weeks then stop aspirin and take Plavix alone after 4 weeks 08/05/18   Roxan Hockey, MD  atorvastatin (LIPITOR) 80 MG tablet Take 1 tablet (80 mg total) by mouth every evening. For stroke prevention 08/05/18   Roxan Hockey, MD  Calcium Carbonate-Vitamin D (CALCIUM PLUS VITAMIN D) 500-50 MG-UNIT CAPS Take 1 tablet by mouth every evening.     [provider]  Cinnamon 500 MG TABS Take 500 mg by mouth every evening.     [provider]  clopidogrel (PLAVIX) 75 MG tablet Take 1 tablet (75 mg total) by mouth daily. Take along with aspirin 81 mg daily for 4 weeks then stop aspirin and continue Plavix indefinitely 08/06/18   Roxan Hockey, MD  gabapentin (NEURONTIN) 300 MG capsule Take 600 mg by mouth 2 (two) times daily.  12/19/13   [provider]  Ginger, Zingiber officinalis, (GINGER ROOT PO) Take 1 tablet by mouth every evening.    [provider]  lisinopril (ZESTRIL) 40 MG tablet Take 1 tablet (40 mg total) by mouth daily. 08/05/18   Roxan Hockey, MD  loratadine (CLARITIN) 10 MG tablet Take 10 mg by mouth daily.    [provider]  Magnesium Chloride (MAGNESIUM DR PO) Take 1 tablet by mouth every evening.    [provider]  meclizine (ANTIVERT) 12.5 MG tablet Take 1 tablet (12.5 mg total) by mouth 3 (three) times daily as needed for dizziness. 08/05/18   Roxan Hockey, MD  metFORMIN (GLUCOPHAGE-XR) 500 MG 24 hr tablet Take 500 mg by mouth every evening.  05/19/18   [provider]  metoprolol succinate (TOPROL XL) 25 MG 24 hr tablet Take 1 tablet (25 mg total) by mouth daily. 08/05/18 08/05/19  Roxan Hockey, MD  Misc Natural Products (BLACK CHERRY CONCENTRATE PO) Take 1 tablet by mouth every evening.      [provider]  tiZANidine (ZANAFLEX) 4 MG tablet Take 4 mg by mouth at bedtime. 07/24/18   [provider]  TURMERIC PO Take 1 capsule by mouth every evening.     [provider]  vitamin B-12 (CYANOCOBALAMIN) 500 MCG tablet Take 1 tablet by mouth every morning.     [provider]    Physical Exam: Vitals:   08/08/18 0300 08/08/18 0315 08/08/18 0330 08/08/18 0400  BP: (!) 174/62  (!) 190/83 (!) 192/69  Pulse: 66 66 65 64  Resp:      Temp:      TempSrc:      SpO2: 98% 99% 99% 97%  Weight:      Height:        Constitutional: NAD, calm, comfortable Eyes: PERRL, lids and conjunctivae normal ENMT: Mucous membranes are moist. Posterior pharynx clear of any exudate or lesions. Neck: normal, supple, no masses, no thyromegaly Respiratory: clear to  auscultation bilaterally, no wheezing, no crackles. Normal respiratory effort. No accessory muscle use.  Cardiovascular: Regular rate and rhythm, no murmurs / rubs / gallops. No extremity edema. 2+ pedal pulses. No carotid bruits.  Abdomen: Obese, soft, no tenderness, no masses palpated. No hepatosplenomegaly. Bowel sounds positive.  Musculoskeletal: no clubbing / cyanosis.  Good ROM, no contractures. Normal muscle tone.  Skin: no rashes, lesions, ulcers on limited dermatological examination. Neurologic: CN 2-12 grossly intact. Sensation intact, DTR normal. Strength 5/5 in all 4.  Unable to evaluate gait. Psychiatric: Normal judgment and insight. Alert and oriented x 3. Normal mood.   Labs on Admission: I have personally reviewed following labs and imaging studies  CBC: Recent Labs  Lab 08/03/18 1119 08/03/18 1253 08/04/18 0511 08/07/18 2155  WBC 5.7  --  6.3 5.5  NEUTROABS  --  3.1  --  2.8  HGB 13.8  --  12.5 12.7  HCT 42.2  --  39.2 38.2  MCV 98.4  --  100.0 96.2  PLT 159  --  162 026   Basic Metabolic Panel: Recent Labs  Lab 08/03/18 1119 08/04/18 0511 08/05/18 0413 08/07/18 2155  NA 141  140 141 143  K 3.7 4.2 4.0 3.7  CL 105 105 109 109  CO2 24 26 27 23   GLUCOSE 118* 121* 116* 109*  BUN 23 29* 28* 22  CREATININE 0.79 1.35* 0.91 0.62  CALCIUM 9.1 9.6 9.1 9.1   GFR: Estimated Creatinine Clearance: 75.6 mL/min (by C-G formula based on SCr of 0.62 mg/dL). Liver Function Tests: Recent Labs  Lab 08/07/18 2155  AST 50*  ALT 48*  ALKPHOS 52  BILITOT 0.6  PROT 7.0  ALBUMIN 3.7   No results for input(s): LIPASE, AMYLASE in the last 168 hours. No results for input(s): AMMONIA in the last 168 hours. Coagulation Profile: Recent Labs  Lab 08/03/18 1253 08/07/18 2155  INR 1.0 1.0   Cardiac Enzymes: No results for input(s): CKTOTAL, CKMB, CKMBINDEX, TROPONINI in the last 168 hours. BNP (last 3 results) No results for input(s): PROBNP in the last 8760 hours. HbA1C: No results for input(s): HGBA1C in the last 72 hours. CBG: Recent Labs  Lab 08/04/18 1105 08/04/18 1604 08/04/18 2123 08/05/18 0723 08/05/18 1111  GLUCAP 96 98 80 79 87   Lipid Profile: No results for input(s): CHOL, HDL, LDLCALC, TRIG, CHOLHDL, LDLDIRECT in the last 72 hours. Thyroid Function Tests: No results for input(s): TSH, T4TOTAL, FREET4, T3FREE, THYROIDAB in the last 72 hours. Anemia Panel: No results for input(s): VITAMINB12, FOLATE, FERRITIN, TIBC, IRON, RETICCTPCT in the last 72 hours. Urine analysis:    Component Value Date/Time   COLORURINE YELLOW 08/07/2018 2237   APPEARANCEUR CLEAR 08/07/2018 2237   LABSPEC 1.013 08/07/2018 2237   PHURINE 7.0 08/07/2018 2237   GLUCOSEU NEGATIVE 08/07/2018 2237   HGBUR NEGATIVE 08/07/2018 2237   BILIRUBINUR NEGATIVE 08/07/2018 2237   KETONESUR NEGATIVE 08/07/2018 2237   PROTEINUR NEGATIVE 08/07/2018 2237   NITRITE POSITIVE (A) 08/07/2018 2237   LEUKOCYTESUR NEGATIVE 08/07/2018 2237    Radiological Exams on Admission: Ct Angio Head W/cm &/or Wo Cm  Result Date: 08/08/2018 CLINICAL DATA:  Initial evaluation for acute dizziness, headache.  EXAM: CT ANGIOGRAPHY HEAD AND NECK TECHNIQUE: Multidetector CT imaging of the head and neck was performed using the standard protocol during bolus administration of intravenous contrast. Multiplanar CT image reconstructions and MIPs were obtained to evaluate the vascular anatomy. Carotid stenosis measurements (when applicable) are obtained utilizing NASCET criteria, using the  distal internal carotid diameter as the denominator. CONTRAST:  34mL OMNIPAQUE IOHEXOL 350 MG/ML SOLN COMPARISON:  Prior MRI and MRA from 08/03/2018. FINDINGS: CT HEAD FINDINGS Brain: Generalized age-related cerebral atrophy with mild chronic small vessel ischemic disease. Recently identified small right cerebellar infarct not visible by CT. No acute large vessel territory infarct. No acute intracranial hemorrhage. No mass lesion, midline shift or mass effect. No hydrocephalus. No extra-axial fluid collection. Vascular: No hyperdense vessel. Scattered vascular calcifications noted within the carotid siphons. Skull: Scalp soft tissues within normal limits. Calvarium intact. Hyperostosis frontalis interna noted. Sinuses: Paranasal sinuses and mastoid air cells are clear. Orbits: Globes and orbital soft tissues demonstrate no acute finding. Review of the MIP images confirms the above findings CTA NECK FINDINGS Aortic arch: Visualized aortic arch normal in caliber with normal 3 vessel morphology. Mild calcified noncalcified plaque within the arch and proximal descending intrathoracic aorta. No hemodynamically significant stenosis seen about the origin of the great vessels. Visualized subclavian arteries widely patent. Right carotid system: Right common carotid artery widely patent from its origin to the bifurcation without stenosis. Mild a centric calcified plaque about the right bifurcation without hemodynamically significant stenosis. Right ICA mildly tortuous but otherwise widely patent to the skull base without stenosis, dissection or  occlusion. Left carotid system: Left common carotid artery widely patent from its origin to the bifurcation without stenosis. No significant atheromatous narrowing about the left bifurcation. Left ICA widely patent distally to the skull base without stenosis, dissection or occlusion. Vertebral arteries: Both of the vertebral arteries arise from the subclavian arteries. Focal plaque at the origin of the left vertebral artery with approximate moderate 50-75% stenosis. Vertebral arteries otherwise widely patent within the neck without stenosis, dissection, or occlusion. Skeleton: No acute osseous finding. No discrete lytic or blastic osseous lesions. Degenerative spondylolysis noted at C5-6 and C6-7. Other neck: No other significant soft tissue finding within the neck. Upper chest: Visualized upper chest demonstrates no acute finding. Review of the MIP images confirms the above findings CTA HEAD FINDINGS Anterior circulation: Petrous segments widely patent bilaterally. Moderate calcified atheromatous plaque within the carotid siphons with up to approximate 50% stenosis bilaterally. ICA termini well perfused. A1 segments patent bilaterally. Normal anterior communicating artery. Anterior cerebral arteries widely patent to their distal aspects without stenosis. No M1 stenosis or occlusion. Distal MCA branches well perfused and symmetric. Posterior circulation: Vertebral arteries widely patent to the vertebrobasilar junction. Posterior inferior cerebral arteries patent bilaterally. Basilar widely patent to its distal aspect without stenosis. Superior cerebral arteries patent bilaterally. Right PCA primarily supplied via the basilar. Hypoplastic left P1 with small left posterior communicating artery. Focal moderate approximate 50% stenosis at the proximal right P2 segment. PCAs otherwise widely patent to their distal aspects without stenosis. Venous sinuses: Patent. Anatomic variants: None significant. Delayed phase: Not  performed. Review of the MIP images confirms the above findings IMPRESSION: CT HEAD IMPRESSION: 1. No acute intracranial abnormality. Recently identified small right cerebellar infarct not well visualized by CT. 2. Age-related cerebral atrophy with mild chronic small vessel ischemic disease. CTA HEAD AND NECK IMPRESSION: 1. Negative CTA for large vessel occlusion. 2. Approximate 50-75% atheromatous stenosis at the origin of the left vertebral artery. Additional approximate 50% proximal right P2 stenosis. Vertebrobasilar system otherwise widely patent. 3. Moderate carotid siphon atherosclerotic change with associated mild to moderate diffuse stenosis (up to approximately 50%). Both carotid arteries and anterior circulation otherwise widely patent. Electronically Signed   By: Jeannine Boga M.D.   On:  08/08/2018 00:55   Ct Angio Neck W And/or Wo Contrast  Result Date: 08/08/2018 CLINICAL DATA:  Initial evaluation for acute dizziness, headache. EXAM: CT ANGIOGRAPHY HEAD AND NECK TECHNIQUE: Multidetector CT imaging of the head and neck was performed using the standard protocol during bolus administration of intravenous contrast. Multiplanar CT image reconstructions and MIPs were obtained to evaluate the vascular anatomy. Carotid stenosis measurements (when applicable) are obtained utilizing NASCET criteria, using the distal internal carotid diameter as the denominator. CONTRAST:  67mL OMNIPAQUE IOHEXOL 350 MG/ML SOLN COMPARISON:  Prior MRI and MRA from 08/03/2018. FINDINGS: CT HEAD FINDINGS Brain: Generalized age-related cerebral atrophy with mild chronic small vessel ischemic disease. Recently identified small right cerebellar infarct not visible by CT. No acute large vessel territory infarct. No acute intracranial hemorrhage. No mass lesion, midline shift or mass effect. No hydrocephalus. No extra-axial fluid collection. Vascular: No hyperdense vessel. Scattered vascular calcifications noted within the  carotid siphons. Skull: Scalp soft tissues within normal limits. Calvarium intact. Hyperostosis frontalis interna noted. Sinuses: Paranasal sinuses and mastoid air cells are clear. Orbits: Globes and orbital soft tissues demonstrate no acute finding. Review of the MIP images confirms the above findings CTA NECK FINDINGS Aortic arch: Visualized aortic arch normal in caliber with normal 3 vessel morphology. Mild calcified noncalcified plaque within the arch and proximal descending intrathoracic aorta. No hemodynamically significant stenosis seen about the origin of the great vessels. Visualized subclavian arteries widely patent. Right carotid system: Right common carotid artery widely patent from its origin to the bifurcation without stenosis. Mild a centric calcified plaque about the right bifurcation without hemodynamically significant stenosis. Right ICA mildly tortuous but otherwise widely patent to the skull base without stenosis, dissection or occlusion. Left carotid system: Left common carotid artery widely patent from its origin to the bifurcation without stenosis. No significant atheromatous narrowing about the left bifurcation. Left ICA widely patent distally to the skull base without stenosis, dissection or occlusion. Vertebral arteries: Both of the vertebral arteries arise from the subclavian arteries. Focal plaque at the origin of the left vertebral artery with approximate moderate 50-75% stenosis. Vertebral arteries otherwise widely patent within the neck without stenosis, dissection, or occlusion. Skeleton: No acute osseous finding. No discrete lytic or blastic osseous lesions. Degenerative spondylolysis noted at C5-6 and C6-7. Other neck: No other significant soft tissue finding within the neck. Upper chest: Visualized upper chest demonstrates no acute finding. Review of the MIP images confirms the above findings CTA HEAD FINDINGS Anterior circulation: Petrous segments widely patent bilaterally.  Moderate calcified atheromatous plaque within the carotid siphons with up to approximate 50% stenosis bilaterally. ICA termini well perfused. A1 segments patent bilaterally. Normal anterior communicating artery. Anterior cerebral arteries widely patent to their distal aspects without stenosis. No M1 stenosis or occlusion. Distal MCA branches well perfused and symmetric. Posterior circulation: Vertebral arteries widely patent to the vertebrobasilar junction. Posterior inferior cerebral arteries patent bilaterally. Basilar widely patent to its distal aspect without stenosis. Superior cerebral arteries patent bilaterally. Right PCA primarily supplied via the basilar. Hypoplastic left P1 with small left posterior communicating artery. Focal moderate approximate 50% stenosis at the proximal right P2 segment. PCAs otherwise widely patent to their distal aspects without stenosis. Venous sinuses: Patent. Anatomic variants: None significant. Delayed phase: Not performed. Review of the MIP images confirms the above findings IMPRESSION: CT HEAD IMPRESSION: 1. No acute intracranial abnormality. Recently identified small right cerebellar infarct not well visualized by CT. 2. Age-related cerebral atrophy with mild chronic small vessel ischemic  disease. CTA HEAD AND NECK IMPRESSION: 1. Negative CTA for large vessel occlusion. 2. Approximate 50-75% atheromatous stenosis at the origin of the left vertebral artery. Additional approximate 50% proximal right P2 stenosis. Vertebrobasilar system otherwise widely patent. 3. Moderate carotid siphon atherosclerotic change with associated mild to moderate diffuse stenosis (up to approximately 50%). Both carotid arteries and anterior circulation otherwise widely patent. Electronically Signed   By: Jeannine Boga M.D.   On: 08/08/2018 00:55   08/05/2018 Echocardiogram IMPRESSIONS   1. The left ventricle has normal systolic function, with an ejection fraction of 55-60%. The cavity  size was normal. There is mildly increased left ventricular wall thickness. Left ventricular diastolic Doppler parameters are consistent with impaired  relaxation. There is abnormal septal motion consistent with left bundle branch block.  2. The right ventricle has normal systolic function. The cavity was normal. There is no increase in right ventricular wall thickness. Right ventricular systolic pressure could not be assessed.  3. Trivial pericardial effusion is present.  4. The pericardial effusion is posterior to the left ventricle.  5. The aortic valve is tricuspid. Mild calcification of the aortic valve. Mild aortic annular calcification noted.  6. The mitral valve is grossly normal. There is mild mitral annular calcification present. Mild mitral regurgitation.  7. The tricuspid valve is grossly normal.  8. The aortic root is normal in size and structure.  Carotid Doppler. IMPRESSION: Color duplex indicates minimal heterogeneous and calcified plaque, with no hemodynamically significant stenosis by duplex criteria in the extracranial cerebrovascular circulation.  EKG: Independently reviewed.   Assessment/Plan Principal Problem:   Cerebellar stroke (HCC) Observation/telemetry. Frequent neuro checks. Swallow screen. PT/OT/SLP. Rest of the work-up has been done recently. Will need repeat brain MRI. Consider neurology consult.  Active Problems:   Hypertension Allow permissive 220/120 mmHg hypertension for now.    HLD (hyperlipidemia) Continue statin.    DM (diabetes mellitus), type 2 (HCC) Carbohydrate modified diet. Continue metformin. CBG monitoring regular insulin sliding scale.    PAF (paroxysmal atrial fibrillation) (HCC) CHA?DS?-VASc Score of at least 6. Not on anticoagulation.    DVT prophylaxis: Lovenox SQ. Code Status: Full code. Family Communication:  Disposition Plan: Transfer to [facility] for MRI. Consults called: Tele-neurology  Admission status:  Observation/telemetry.   Reubin Milan MD [facility]  08/08/2018, 5:17 AM     This document was prepared using Dragon voice recognition software and may contain some unintended transcription errors.

## 2018-08-08 NOTE — ED Notes (Signed)
[facility] arrived  

## 2018-08-08 NOTE — Evaluation (Signed)
Physical Therapy Evaluation Patient Details Name: Sabrina Taylor MRN: [id number] DOB: 09-22-1945 Today's Date: 08/08/2018   History of Present Illness  Patient is a 73 y/o female presening to the ED on 08/08/2018 with primary complaints of Dizziness and high blood pressure. Of note recent hospital admission at [facility] for CVA. PMH significant of DDD, type 2 diabetes, first-degree AV block, hyperlipidemia, hypertension, LBBB, obesity, PAF, history of recent CVA. CT head showed previous known CVA, but no new areas of ischemia.      Clinical Impression  Patient admitted with the above listed diagnosis. Patient reports she had been setup to receive HHPT following last admission and plans for therapists to be at her home tomorrow. Patient performing OOB mobility at general min guard level for safety - most symptom provocation with horizontal and vertical head turns with gait. Will recommend continued HHPT services for balance and dizziness. PT to follow acutely.      Follow Up Recommendations Home health PT;Supervision - Intermittent    Equipment Recommendations  None recommended by PT    Recommendations for Other Services       Precautions / Restrictions Precautions Precautions: Fall Precaution Comments: intermittent dizziness Restrictions Weight Bearing Restrictions: No      Mobility  Bed Mobility Overal bed mobility: Modified Independent             General bed mobility comments: pt sitting on bed upon arrival;returned to supine mod Independent  Transfers Overall transfer level: Needs assistance Equipment used: Rolling walker (2 wheeled) Transfers: Sit to/from Stand Sit to Stand: Min guard         General transfer comment: for safety   Ambulation/Gait Ambulation/Gait assistance: Min guard Gait Distance (Feet): 250 Feet Assistive device: Rolling walker (2 wheeled) Gait Pattern/deviations: Step-through pattern;Decreased stride length;Trunk flexed Gait velocity:  decreased   General Gait Details: mild unsteadiness - no LOB  Stairs Stairs: Yes Stairs assistance: Min guard Stair Management: Two rails;Alternating pattern;Forwards Number of Stairs: 2    Wheelchair Mobility    Modified Rankin (Stroke Patients Only) Modified Rankin (Stroke Patients Only) Pre-Morbid Rankin Score: No significant disability Modified Rankin: Moderate disability     Balance Overall balance assessment: Needs assistance Sitting-balance support: No upper extremity supported;Feet supported Sitting balance-Leahy Scale: Good     Standing balance support: Bilateral upper extremity supported;During functional activity Standing balance-Leahy Scale: Fair Standing balance comment: able to dress self static standing;fair with RW requiring only supervision with straightline and turning                 Standardized Balance Assessment Standardized Balance Assessment : Dynamic Gait Index   Dynamic Gait Index Level Surface: Mild Impairment Change in Gait Speed: Mild Impairment Gait with Horizontal Head Turns: Moderate Impairment Gait with Vertical Head Turns: Moderate Impairment Gait and Pivot Turn: Mild Impairment Step Over Obstacle: Mild Impairment Step Around Obstacles: Mild Impairment Steps: Mild Impairment Total Score: 14       Pertinent Vitals/Pain Pain Assessment: No/denies pain    Home Living Family/patient expects to be discharged to:: Private residence Living Arrangements: Spouse/significant other;Children Available Help at Discharge: Family;Available 24 hours/day Type of Home: House Home Access: Stairs to enter Entrance Stairs-Rails: Left Entrance Stairs-Number of Steps: 4 Home Layout: Two level;Able to live on main level with bedroom/bathroom Home Equipment: Gilford Rile - 2 wheels;Cane - single point;Grab bars - toilet;Grab bars - tub/shower      Prior Function Level of Independence: Independent         Comments:  driving;pt and husband  assist babysit grandchildren 31 59month oldsreports use of RW since last stroke;shopping until COVID, now daughter assists with shopping      Hand Dominance   Dominant Hand: Right    Extremity/Trunk Assessment   Upper Extremity Assessment Upper Extremity Assessment: Overall WFL for tasks assessed    Lower Extremity Assessment Lower Extremity Assessment: Overall WFL for tasks assessed    Cervical / Trunk Assessment Cervical / Trunk Assessment: Normal  Communication   Communication: No difficulties  Cognition Arousal/Alertness: Awake/alert Behavior During Therapy: WFL for tasks assessed/performed Overall Cognitive Status: Within Functional Limits for tasks assessed                                 General Comments: verbalized good awareness of stroke signs and symptoms;reports she read all handouts provided to her       General Comments      Exercises     Assessment/Plan    PT Assessment Patient needs continued PT services  PT Problem List Decreased strength;Decreased activity tolerance;Decreased mobility;Decreased balance;Decreased knowledge of use of DME;Decreased safety awareness       PT Treatment Interventions DME instruction;Gait training;Stair training;Functional mobility training;Therapeutic activities;Therapeutic exercise;Balance training;Neuromuscular re-education;Patient/family education    PT Goals (Current goals can be found in the Care Plan section)  Acute Rehab PT Goals Patient Stated Goal: be able to take care of grand children PT Goal Formulation: With patient Time For Goal Achievement: 08/15/18 Potential to Achieve Goals: Good    Frequency Min 3X/week   Barriers to discharge        Co-evaluation               AM-PAC PT "6 Clicks" Mobility  Outcome Measure Help needed turning from your back to your side while in a flat bed without using bedrails?: None Help needed moving from lying on your back to sitting on the side of a  flat bed without using bedrails?: None Help needed moving to and from a bed to a chair (including a wheelchair)?: A Little Help needed standing up from a chair using your arms (e.g., wheelchair or bedside chair)?: A Little Help needed to walk in hospital room?: A Little Help needed climbing 3-5 steps with a railing? : A Little 6 Click Score: 20    End of Session Equipment Utilized During Treatment: Gait belt Activity Tolerance: Patient tolerated treatment well Patient left: in bed;with call bell/phone within reach Nurse Communication: Mobility status PT Visit Diagnosis: Unsteadiness on feet (R26.81);Other abnormalities of gait and mobility (R26.89);Muscle weakness (generalized) (M62.81)    Time: 9163-8466 PT Time Calculation (min) (ACUTE ONLY): 11 min   Charges:   PT Evaluation $PT Eval Low Complexity: 1 Low           Lanney Gins, PT, DPT Supplemental Physical Therapist 08/08/18 1:26 PM Pager: [phone] Office: [phone]

## 2018-08-08 NOTE — ED Notes (Signed)
hospitalist in room  

## 2018-08-08 NOTE — Discharge Summary (Signed)
Physician Discharge Summary  Sabrina Taylor ZTI:[id number] DOB: Sep 25, 1945 DOA: 08/07/2018  PCP: [facility]  Admit date: 08/07/2018 Discharge date: 08/08/2018  Admitted From: Home Disposition: Home Recommendations for Outpatient Follow-up:  1. Follow up with PCP in 1-2 weeks PCP please note that I have changed her lisinopril to 40 in the morning and 10 at night and I have decreased her Lipitor from 80 to 40 mg daily since she cannot get out of bed after taking 80 mg daily. 2. Please obtain BMP/CBC in one week 3. Please follow up with neurologist  Home Health: Yes Equipment/Devices: None Discharge Condition: Stable  CODE STATUS full code  diet recommendation: Cardiac  brief/Interim Summary:73 y.o. female with medical history significant of DDD, type 2 diabetes, first-degree AV block, hyperlipidemia, hypertension, LBBB, obesity, PAF, history of recent CVA who was just discharged on Thursday and returns to the emergency department due to recurrence of the dizziness similar to what she had during her stroke initial presentation.  She denies headache, blurred vision, dysarthria, aphasia, weakness or changes in sensorium.  No fever, chills, sore throat, rhinorrhea, wheezing, hemoptysis, chest pain, dyspnea, palpitations, diaphoresis, PND, orthopnea or pitting edema of the lower extremities.  Denies abdominal pain, nausea or emesis, diarrhea or constipation, melena or hematochezia.  No dysuria, frequency or hematuria.  No polyuria, polydipsia, polyphagia or blurred vision.  ED Course: Initial vital signs temperature 98.6 F, pulse 69, respiration 18, blood pressure 214/96 mmHg and O2 sat 98% on room air.  Urinalysis was positive for nitrites and had many bacteria.  UDS was negative.  CBC was normal.  PT/INR and APTT within normal limits.  CMP shows a glucose of 109 mg/dL, and an very minimal elevation of transaminases.  Alcohol level was normal.  CT head showed previous known CVA,  but no new areas of ischemia.  Please see images and full radiology report for further detail.  Discharge Diagnoses:  Principal Problem:   Cerebellar stroke ([place]) Active Problems:   HLD (hyperlipidemia)   DM (diabetes mellitus), type 2 (HCC)   PAF (paroxysmal atrial fibrillation) (HCC)   Hypertension  #1 cerebellar stroke patient was admitted to [facility] early last week and discharged 3 days prior to this admission and had full work-up for stroke except MRI of the brain.  MRI of the brain confirms near complete interval resolution of diffusion signal abnormality associated with a now subacute subcentimeter right cerebellar infarct.  She already has physical therapy at home.  I have advised her to continue aspirin and Plavix for 4 weeks.  And then continue only Plavix.  She will follow-up with a neurologist at [facility].  I have also decreased her ACE inhibitor lisinopril to 40 mg daily as he can she could not get out of bed after taking 80 mg daily   #2 uncontrolled hypertension increase lisinopril to 40 in the morning and 10 at night.  Continue metoprolol at the same dose.  I have advised her to take her blood pressure daily and write it down and take it to her PCP who she has appointment within this week.  I did tell her that she is going to feel dizzy for a while with a cerebellar stroke.  #3 hyperlipidemia decrease the dose of Lipitor to 40 mg daily due to side effects of muscle weakness.  Her LDL was 72.  #4 type 2 diabetes controlled on metformin continue.  Hemoglobin A1c was 5.7.  Estimated body mass index is 31.93 kg/m as calculated  from the following:   Height as of this encounter: 5\' 8"  (1.727 m).   Weight as of this encounter: 95.3 kg.  Discharge Instructions   Allergies as of 08/08/2018      Reactions   Penicillins Hives   Did it involve swelling of the face/tongue/throat, SOB, or low BP? No Did it involve sudden or severe rash/hives, skin peeling, or any reaction on the  inside of your mouth or nose? Yes-Hives Did you need to seek medical attention at a hospital or doctor's office? Yes When did it last happen?73 years old If all above answers are "NO", may proceed with cephalosporin use.      Medication List    STOP taking these medications   acetaminophen 325 MG tablet Commonly known as: TYLENOL     TAKE these medications   aspirin 81 MG EC tablet Take 1 tablet (81 mg total) by mouth daily with breakfast. Along with Plavix for 4 weeks then stop aspirin and take Plavix alone after 4 weeks   atorvastatin 40 MG tablet Commonly known as: Lipitor Take 1 tablet (40 mg total) by mouth daily. What changed:   medication strength  how much to take  when to take this  additional instructions   BLACK CHERRY CONCENTRATE PO Take 1 tablet by mouth every evening.   Calcium Plus Vitamin D 500-50 MG-UNIT Caps Generic drug: Calcium Carbonate-Vitamin D Take 1 tablet by mouth every evening.   Cinnamon 500 MG Tabs Take 500 mg by mouth every evening.   clopidogrel 75 MG tablet Commonly known as: PLAVIX Take 1 tablet (75 mg total) by mouth daily. Take along with aspirin 81 mg daily for 4 weeks then stop aspirin and continue Plavix indefinitely   gabapentin 300 MG capsule Commonly known as: NEURONTIN Take 600 mg by mouth 2 (two) times daily.   GINGER ROOT PO Take 1 tablet by mouth every evening.   lisinopril 10 MG tablet Commonly known as: ZESTRIL Take 1 tablet (10 mg total) by mouth daily. Take it at night What changed:   medication strength  how much to take  additional instructions   loratadine 10 MG tablet Commonly known as: CLARITIN Take 10 mg by mouth daily.   MAGNESIUM DR PO Take 1 tablet by mouth every evening.   meclizine 12.5 MG tablet Commonly known as: ANTIVERT Take 1 tablet (12.5 mg total) by mouth 3 (three) times daily as needed for dizziness.   metFORMIN 500 MG 24 hr tablet Commonly known as: GLUCOPHAGE-XR Take  500 mg by mouth every evening.   metoprolol succinate 25 MG 24 hr tablet Commonly known as: Toprol XL Take 1 tablet (25 mg total) by mouth daily.   tiZANidine 4 MG tablet Commonly known as: ZANAFLEX Take 4 mg by mouth at bedtime.   TURMERIC PO Take 1 capsule by mouth every evening.   vitamin B-12 500 MCG tablet Commonly known as: CYANOCOBALAMIN Take 1 tablet by mouth every morning.      Follow-up Information    [name].   Specialty: Family Medicine Contact information: [place] [address] [phone]          Allergies  Allergen Reactions  . Penicillins Hives    Did it involve swelling of the face/tongue/throat, SOB, or low BP? No Did it involve sudden or severe rash/hives, skin peeling, or any reaction on the inside of your mouth or nose? Yes-Hives Did you need to seek medical attention at  a hospital or doctor's office? Yes When did it last happen?73 years old If all above answers are "NO", may proceed with cephalosporin use.     Consultations:  None.   Procedures/Studies: Ct Angio Head W/cm &/or Wo Cm  Result Date: 08/08/2018 CLINICAL DATA:  Initial evaluation for acute dizziness, headache. EXAM: CT ANGIOGRAPHY HEAD AND NECK TECHNIQUE: Multidetector CT imaging of the head and neck was performed using the standard protocol during bolus administration of intravenous contrast. Multiplanar CT image reconstructions and MIPs were obtained to evaluate the vascular anatomy. Carotid stenosis measurements (when applicable) are obtained utilizing NASCET criteria, using the distal internal carotid diameter as the denominator. CONTRAST:  28mL OMNIPAQUE IOHEXOL 350 MG/ML SOLN COMPARISON:  Prior MRI and MRA from 08/03/2018. FINDINGS: CT HEAD FINDINGS Brain: Generalized age-related cerebral atrophy with mild chronic small vessel ischemic disease. Recently identified small right cerebellar infarct not visible by CT.  No acute large vessel territory infarct. No acute intracranial hemorrhage. No mass lesion, midline shift or mass effect. No hydrocephalus. No extra-axial fluid collection. Vascular: No hyperdense vessel. Scattered vascular calcifications noted within the carotid siphons. Skull: Scalp soft tissues within normal limits. Calvarium intact. Hyperostosis frontalis interna noted. Sinuses: Paranasal sinuses and mastoid air cells are clear. Orbits: Globes and orbital soft tissues demonstrate no acute finding. Review of the MIP images confirms the above findings CTA NECK FINDINGS Aortic arch: Visualized aortic arch normal in caliber with normal 3 vessel morphology. Mild calcified noncalcified plaque within the arch and proximal descending intrathoracic aorta. No hemodynamically significant stenosis seen about the origin of the great vessels. Visualized subclavian arteries widely patent. Right carotid system: Right common carotid artery widely patent from its origin to the bifurcation without stenosis. Mild a centric calcified plaque about the right bifurcation without hemodynamically significant stenosis. Right ICA mildly tortuous but otherwise widely patent to the skull base without stenosis, dissection or occlusion. Left carotid system: Left common carotid artery widely patent from its origin to the bifurcation without stenosis. No significant atheromatous narrowing about the left bifurcation. Left ICA widely patent distally to the skull base without stenosis, dissection or occlusion. Vertebral arteries: Both of the vertebral arteries arise from the subclavian arteries. Focal plaque at the origin of the left vertebral artery with approximate moderate 50-75% stenosis. Vertebral arteries otherwise widely patent within the neck without stenosis, dissection, or occlusion. Skeleton: No acute osseous finding. No discrete lytic or blastic osseous lesions. Degenerative spondylolysis noted at C5-6 and C6-7. Other neck: No other  significant soft tissue finding within the neck. Upper chest: Visualized upper chest demonstrates no acute finding. Review of the MIP images confirms the above findings CTA HEAD FINDINGS Anterior circulation: Petrous segments widely patent bilaterally. Moderate calcified atheromatous plaque within the carotid siphons with up to approximate 50% stenosis bilaterally. ICA termini well perfused. A1 segments patent bilaterally. Normal anterior communicating artery. Anterior cerebral arteries widely patent to their distal aspects without stenosis. No M1 stenosis or occlusion. Distal MCA branches well perfused and symmetric. Posterior circulation: Vertebral arteries widely patent to the vertebrobasilar junction. Posterior inferior cerebral arteries patent bilaterally. Basilar widely patent to its distal aspect without stenosis. Superior cerebral arteries patent bilaterally. Right PCA primarily supplied via the basilar. Hypoplastic left P1 with small left posterior communicating artery. Focal moderate approximate 50% stenosis at the proximal right P2 segment. PCAs otherwise widely patent to their distal aspects without stenosis. Venous sinuses: Patent. Anatomic variants: None significant. Delayed phase: Not performed. Review of the MIP images confirms the above findings  IMPRESSION: CT HEAD IMPRESSION: 1. No acute intracranial abnormality. Recently identified small right cerebellar infarct not well visualized by CT. 2. Age-related cerebral atrophy with mild chronic small vessel ischemic disease. CTA HEAD AND NECK IMPRESSION: 1. Negative CTA for large vessel occlusion. 2. Approximate 50-75% atheromatous stenosis at the origin of the left vertebral artery. Additional approximate 50% proximal right P2 stenosis. Vertebrobasilar system otherwise widely patent. 3. Moderate carotid siphon atherosclerotic change with associated mild to moderate diffuse stenosis (up to approximately 50%). Both carotid arteries and anterior  circulation otherwise widely patent. Electronically Signed   By: Jeannine Boga M.D.   On: 08/08/2018 00:55   Ct Head Wo Contrast  Result Date: 08/03/2018 CLINICAL DATA:  Dizziness. EXAM: CT HEAD WITHOUT CONTRAST TECHNIQUE: Contiguous axial images were obtained from the base of the skull through the vertex without intravenous contrast. COMPARISON:  None. FINDINGS: Brain: No evidence of acute infarction, hemorrhage, hydrocephalus, extra-axial collection or mass lesion/mass effect. Vascular: Atherosclerotic vascular calcification of the carotid siphons. No hyperdense vessel. Skull: Negative for fracture or focal lesion. Sinuses/Orbits: No acute finding. Other: None. IMPRESSION: 1.  No acute intracranial abnormality. Electronically Signed   By: Titus Dubin M.D.   On: 08/03/2018 13:37   Ct Angio Neck W And/or Wo Contrast  Result Date: 08/08/2018 CLINICAL DATA:  Initial evaluation for acute dizziness, headache. EXAM: CT ANGIOGRAPHY HEAD AND NECK TECHNIQUE: Multidetector CT imaging of the head and neck was performed using the standard protocol during bolus administration of intravenous contrast. Multiplanar CT image reconstructions and MIPs were obtained to evaluate the vascular anatomy. Carotid stenosis measurements (when applicable) are obtained utilizing NASCET criteria, using the distal internal carotid diameter as the denominator. CONTRAST:  74mL OMNIPAQUE IOHEXOL 350 MG/ML SOLN COMPARISON:  Prior MRI and MRA from 08/03/2018. FINDINGS: CT HEAD FINDINGS Brain: Generalized age-related cerebral atrophy with mild chronic small vessel ischemic disease. Recently identified small right cerebellar infarct not visible by CT. No acute large vessel territory infarct. No acute intracranial hemorrhage. No mass lesion, midline shift or mass effect. No hydrocephalus. No extra-axial fluid collection. Vascular: No hyperdense vessel. Scattered vascular calcifications noted within the carotid siphons. Skull: Scalp soft  tissues within normal limits. Calvarium intact. Hyperostosis frontalis interna noted. Sinuses: Paranasal sinuses and mastoid air cells are clear. Orbits: Globes and orbital soft tissues demonstrate no acute finding. Review of the MIP images confirms the above findings CTA NECK FINDINGS Aortic arch: Visualized aortic arch normal in caliber with normal 3 vessel morphology. Mild calcified noncalcified plaque within the arch and proximal descending intrathoracic aorta. No hemodynamically significant stenosis seen about the origin of the great vessels. Visualized subclavian arteries widely patent. Right carotid system: Right common carotid artery widely patent from its origin to the bifurcation without stenosis. Mild a centric calcified plaque about the right bifurcation without hemodynamically significant stenosis. Right ICA mildly tortuous but otherwise widely patent to the skull base without stenosis, dissection or occlusion. Left carotid system: Left common carotid artery widely patent from its origin to the bifurcation without stenosis. No significant atheromatous narrowing about the left bifurcation. Left ICA widely patent distally to the skull base without stenosis, dissection or occlusion. Vertebral arteries: Both of the vertebral arteries arise from the subclavian arteries. Focal plaque at the origin of the left vertebral artery with approximate moderate 50-75% stenosis. Vertebral arteries otherwise widely patent within the neck without stenosis, dissection, or occlusion. Skeleton: No acute osseous finding. No discrete lytic or blastic osseous lesions. Degenerative spondylolysis noted at C5-6 and C6-7.  Other neck: No other significant soft tissue finding within the neck. Upper chest: Visualized upper chest demonstrates no acute finding. Review of the MIP images confirms the above findings CTA HEAD FINDINGS Anterior circulation: Petrous segments widely patent bilaterally. Moderate calcified atheromatous plaque  within the carotid siphons with up to approximate 50% stenosis bilaterally. ICA termini well perfused. A1 segments patent bilaterally. Normal anterior communicating artery. Anterior cerebral arteries widely patent to their distal aspects without stenosis. No M1 stenosis or occlusion. Distal MCA branches well perfused and symmetric. Posterior circulation: Vertebral arteries widely patent to the vertebrobasilar junction. Posterior inferior cerebral arteries patent bilaterally. Basilar widely patent to its distal aspect without stenosis. Superior cerebral arteries patent bilaterally. Right PCA primarily supplied via the basilar. Hypoplastic left P1 with small left posterior communicating artery. Focal moderate approximate 50% stenosis at the proximal right P2 segment. PCAs otherwise widely patent to their distal aspects without stenosis. Venous sinuses: Patent. Anatomic variants: None significant. Delayed phase: Not performed. Review of the MIP images confirms the above findings IMPRESSION: CT HEAD IMPRESSION: 1. No acute intracranial abnormality. Recently identified small right cerebellar infarct not well visualized by CT. 2. Age-related cerebral atrophy with mild chronic small vessel ischemic disease. CTA HEAD AND NECK IMPRESSION: 1. Negative CTA for large vessel occlusion. 2. Approximate 50-75% atheromatous stenosis at the origin of the left vertebral artery. Additional approximate 50% proximal right P2 stenosis. Vertebrobasilar system otherwise widely patent. 3. Moderate carotid siphon atherosclerotic change with associated mild to moderate diffuse stenosis (up to approximately 50%). Both carotid arteries and anterior circulation otherwise widely patent. Electronically Signed   By: Jeannine Boga M.D.   On: 08/08/2018 00:55   Mr Jodene Nam Head Wo Contrast  Result Date: 08/03/2018 CLINICAL DATA:  Dizziness since yesterday. History of hypertension, atrial fibrillation, and cardiac arrhythmia. EXAM: MRI HEAD  WITHOUT CONTRAST MRA HEAD WITHOUT CONTRAST TECHNIQUE: Multiplanar, multiecho pulse sequences of the brain and surrounding structures were obtained without intravenous contrast. Angiographic images of the head were obtained using MRA technique without contrast. COMPARISON:  CT head earlier today was unremarkable. FINDINGS: MRI HEAD FINDINGS Brain: Small focus of restricted diffusion, corresponding low ADC, RIGHT superior cerebellum, confirmed on axial and coronal DWI, consistent with acute infarct. See series 3, image 70. Elsewhere, no acute stroke, hemorrhage, mass lesion, hydrocephalus, or extra-axial fluid. Paragraph mild cerebral and cerebellar atrophy. Mild subcortical and periventricular T2 and FLAIR hyperintensities, likely chronic microvascular ischemic change. Chronic lacunar infarcts affect the brainstem most notably in the RIGHT paramedian pons, as well as punctate areas of chronic ischemia the elsewhere in the RIGHT cerebellum. Vascular: Reported separately. Skull and upper cervical spine: No acute findings. Bifrontal hyperostosis. Sinuses/Orbits: No significant paranasal sinus disease. BILATERAL cataract extraction, otherwise unremarkable orbits. Other: None. MRA HEAD FINDINGS The internal carotid arteries are widely patent. The basilar artery is widely patent with vertebrals both contributing, LEFT slightly larger. No proximal stenosis of the anterior or middle cerebral arteries. No intracranial saccular aneurysm. RIGHT superior cerebellar artery poorly visualized. Moderately diseased LEFT PICA. Poorly visualized LEFT AICA. LEFT SCA patent. Focal 75% stenosis of the proximal RIGHT posterior cerebral artery P1 segment. IMPRESSION: 3 x 5 mm acute RIGHT superior cerebellar infarct. Poorly visualized RIGHT superior cerebellar artery could be occluded. Elsewhere, normal for age cerebral volume with mild small vessel disease. No proximal large vessel stenosis or occlusion. Electronically Signed   By: Staci Righter M.D.   On: 08/03/2018 15:10   Mr Brain Wo Contrast  Result Date: 08/08/2018 CLINICAL  DATA:  Dizziness.  Recent stroke. EXAM: MRI HEAD WITHOUT CONTRAST TECHNIQUE: Multiplanar, multiecho pulse sequences of the brain and surrounding structures were obtained without intravenous contrast. COMPARISON:  Head CT and CTA 08/07/2018 and MRI 08/03/2018 FINDINGS: Brain: Only a punctate focus of subtle residual diffusion weighted signal abnormality remains in the right cerebellum corresponding to the acute infarct on the recent prior MRI. There is faint associated T2/FLAIR hyperintensity. No new infarct, intracranial hemorrhage, mass, midline shift, or extra-axial fluid collection is identified. Mild cerebral atrophy is not greater than expected for age. T2 hyperintensities in the cerebral white matter are unchanged and nonspecific but compatible with mild chronic small vessel ischemic disease. Subcentimeter chronic infarcts are again seen in the pons and right cerebellum, and there is also an unchanged small chronic cortical infarct medial in the right occipital lobe. Vascular: Major intracranial vascular flow voids are preserved. Skull and upper cervical spine: Unremarkable bone marrow signal. Hyperostosis frontalis. Mild upper cervical facet arthrosis. Sinuses/Orbits: Bilateral cataract extraction. Paranasal sinuses and mastoid air cells are clear. Other: None. IMPRESSION: 1. No acute intracranial abnormality. 2. Near complete interval resolution of diffusion signal abnormality associated with the now subacute subcentimeter right cerebellar infarct. 3. Chronic small vessel ischemic disease elsewhere as above. Electronically Signed   By: Logan Bores M.D.   On: 08/08/2018 09:23   Mr Brain Wo Contrast (neuro Protocol)  Result Date: 08/03/2018 CLINICAL DATA:  Dizziness since yesterday. History of hypertension, atrial fibrillation, and cardiac arrhythmia. EXAM: MRI HEAD WITHOUT CONTRAST MRA HEAD WITHOUT CONTRAST  TECHNIQUE: Multiplanar, multiecho pulse sequences of the brain and surrounding structures were obtained without intravenous contrast. Angiographic images of the head were obtained using MRA technique without contrast. COMPARISON:  CT head earlier today was unremarkable. FINDINGS: MRI HEAD FINDINGS Brain: Small focus of restricted diffusion, corresponding low ADC, RIGHT superior cerebellum, confirmed on axial and coronal DWI, consistent with acute infarct. See series 3, image 70. Elsewhere, no acute stroke, hemorrhage, mass lesion, hydrocephalus, or extra-axial fluid. Paragraph mild cerebral and cerebellar atrophy. Mild subcortical and periventricular T2 and FLAIR hyperintensities, likely chronic microvascular ischemic change. Chronic lacunar infarcts affect the brainstem most notably in the RIGHT paramedian pons, as well as punctate areas of chronic ischemia the elsewhere in the RIGHT cerebellum. Vascular: Reported separately. Skull and upper cervical spine: No acute findings. Bifrontal hyperostosis. Sinuses/Orbits: No significant paranasal sinus disease. BILATERAL cataract extraction, otherwise unremarkable orbits. Other: None. MRA HEAD FINDINGS The internal carotid arteries are widely patent. The basilar artery is widely patent with vertebrals both contributing, LEFT slightly larger. No proximal stenosis of the anterior or middle cerebral arteries. No intracranial saccular aneurysm. RIGHT superior cerebellar artery poorly visualized. Moderately diseased LEFT PICA. Poorly visualized LEFT AICA. LEFT SCA patent. Focal 75% stenosis of the proximal RIGHT posterior cerebral artery P1 segment. IMPRESSION: 3 x 5 mm acute RIGHT superior cerebellar infarct. Poorly visualized RIGHT superior cerebellar artery could be occluded. Elsewhere, normal for age cerebral volume with mild small vessel disease. No proximal large vessel stenosis or occlusion. Electronically Signed   By: Staci Righter M.D.   On: 08/03/2018 15:10   US  Carotid Bilateral  Result Date: 08/04/2018 CLINICAL DATA:  73 year old female with a history of stroke EXAM: BILATERAL CAROTID DUPLEX ULTRASOUND TECHNIQUE: Pearline Cables scale imaging, color Doppler and duplex ultrasound were performed of bilateral carotid and vertebral arteries in the neck. COMPARISON:  None. FINDINGS: Criteria: Quantification of carotid stenosis is based on velocity parameters that correlate the residual internal carotid diameter with NASCET-based  stenosis levels, using the diameter of the distal internal carotid lumen as the denominator for stenosis measurement. The following velocity measurements were obtained: RIGHT ICA:  Systolic 867 cm/sec, Diastolic 31 cm/sec CCA:  83 cm/sec SYSTOLIC ICA/CCA RATIO:  1.3 ECA:  85 cm/sec LEFT ICA:  Systolic 69 cm/sec, Diastolic 24 cm/sec CCA:  64 cm/sec SYSTOLIC ICA/CCA RATIO:  1.1 ECA:  67 cm/sec Right Brachial SBP: Not acquired Left Brachial SBP: Not acquired RIGHT CAROTID ARTERY: No significant calcifications of the right common carotid artery. Intermediate waveform maintained. Heterogeneous and partially calcified plaque at the right carotid bifurcation. No significant lumen shadowing. Low resistance waveform of the right ICA. No significant tortuosity. RIGHT VERTEBRAL ARTERY: Antegrade flow with low resistance waveform. LEFT CAROTID ARTERY: No significant calcifications of the left common carotid artery. Intermediate waveform maintained. Heterogeneous and partially calcified plaque at the left carotid bifurcation without significant lumen shadowing. Low resistance waveform of the left ICA. No significant tortuosity. LEFT VERTEBRAL ARTERY:  Antegrade flow with low resistance waveform. IMPRESSION: Color duplex indicates minimal heterogeneous and calcified plaque, with no hemodynamically significant stenosis by duplex criteria in the extracranial cerebrovascular circulation. Signed, Dulcy Fanny. Dellia Nims, RPVI [facility]  [facility] Radiology Electronically Signed   By: Corrie Mckusick D.O.   On: 08/04/2018 15:58    (Echo, Carotid, EGD, Colonoscopy, ERCP)    Subjective: Staff reports patient independently ambulates in the room anxious to go home just had MRI.  Discharge Exam: Vitals:   08/08/18 0552 08/08/18 0724  BP: (!) 177/61 (!) 182/80  Pulse: 62 66  Resp: 18 18  Temp: 97.8 F (36.6 C) 98.4 F (36.9 C)  SpO2:  97%   Vitals:   08/08/18 0330 08/08/18 0400 08/08/18 0552 08/08/18 0724  BP: (!) 190/83 (!) 192/69 (!) 177/61 (!) 182/80  Pulse: 65 64 62 66  Resp:   18 18  Temp:   97.8 F (36.6 C) 98.4 F (36.9 C)  TempSrc:   Oral Oral  SpO2: 99% 97%  97%  Weight:      Height:        General: Pt is alert, awake, not in acute distress Cardiovascular: RRR, S1/S2 +, no rubs, no gallops Respiratory: CTA bilaterally, no wheezing, no rhonchi Abdominal: Soft, NT, ND, bowel sounds + Extremities: no edema, no cyanosis    The results of significant diagnostics from this hospitalization (including imaging, microbiology, ancillary and laboratory) are listed below for reference.     Microbiology: Recent Results (from the past 240 hour(s))  Novel Coronavirus,NAA,(SEND-OUT TO REF LAB - TAT 24-48 hrs); Hosp Order     Status: None   Collection Time: 08/03/18  3:47 PM   Specimen: Nasopharyngeal Swab; Respiratory  Result Value Ref Range Status   SARS-CoV-2, NAA NOT DETECTED NOT DETECTED Final    Comment: (NOTE) This test was developed and its performance characteristics determined by [employer]. This test has not been FDA cleared or approved. This test has been authorized by FDA under an Emergency Use Authorization (EUA). This test is only authorized for the duration of time the declaration that circumstances exist justifying the authorization of the emergency use of in vitro diagnostic tests for detection of SARS-CoV-2 virus and/or diagnosis of COVID-19 infection under section 564(b)(1) of  the Act, 21 U.S.C. 619JKD-3(O)(6), unless the authorization is terminated or revoked sooner. When diagnostic testing is negative, the possibility of a false negative result should be considered in the context of a patient's recent exposures and the presence  of clinical signs and symptoms consistent with COVID-19. An individual without symptoms of COVID-19 and who is not shedding SARS-CoV-2 virus would expect to have a negative (not detected) result in this assay. Performed  At: [facility] [place], [place] [id number] Rush Farmer MD SW:[id number]    [place]  Final    Comment: Performed at [facility], [address] [place], [place] [address]     Labs: BNP (last 3 results) No results for input(s): BNP in the last 8760 hours. Basic Metabolic Panel: Recent Labs  Lab 08/03/18 1119 08/04/18 0511 08/05/18 0413 08/07/18 2155  NA 141 140 141 143  K 3.7 4.2 4.0 3.7  CL 105 105 109 109  CO2 24 26 27 23   GLUCOSE 118* 121* 116* 109*  BUN 23 29* 28* 22  CREATININE 0.79 1.35* 0.91 0.62  CALCIUM 9.1 9.6 9.1 9.1   Liver Function Tests: Recent Labs  Lab 08/07/18 2155  AST 50*  ALT 48*  ALKPHOS 52  BILITOT 0.6  PROT 7.0  ALBUMIN 3.7   No results for input(s): LIPASE, AMYLASE in the last 168 hours. No results for input(s): AMMONIA in the last 168 hours. CBC: Recent Labs  Lab 08/03/18 1119 08/03/18 1253 08/04/18 0511 08/07/18 2155  WBC 5.7  --  6.3 5.5  NEUTROABS  --  3.1  --  2.8  HGB 13.8  --  12.5 12.7  HCT 42.2  --  39.2 38.2  MCV 98.4  --  100.0 96.2  PLT 159  --  162 154   Cardiac Enzymes: No results for input(s): CKTOTAL, CKMB, CKMBINDEX, TROPONINI in the last 168 hours. BNP: Invalid input(s): POCBNP CBG: Recent Labs  Lab 08/04/18 1604 08/04/18 2123 08/05/18 0723 08/05/18 1111 08/08/18 0722  GLUCAP 98 80 79 87 92   D-Dimer No results for input(s): DDIMER in the last 72 hours. Hgb A1c No results  for input(s): HGBA1C in the last 72 hours. Lipid Profile No results for input(s): CHOL, HDL, LDLCALC, TRIG, CHOLHDL, LDLDIRECT in the last 72 hours. Thyroid function studies No results for input(s): TSH, T4TOTAL, T3FREE, THYROIDAB in the last 72 hours.  Invalid input(s): FREET3 Anemia work up No results for input(s): VITAMINB12, FOLATE, FERRITIN, TIBC, IRON, RETICCTPCT in the last 72 hours. Urinalysis    Component Value Date/Time   COLORURINE YELLOW 08/07/2018 2237   APPEARANCEUR CLEAR 08/07/2018 2237   LABSPEC 1.013 08/07/2018 2237   PHURINE 7.0 08/07/2018 2237   GLUCOSEU NEGATIVE 08/07/2018 2237   HGBUR NEGATIVE 08/07/2018 2237   BILIRUBINUR NEGATIVE 08/07/2018 2237   KETONESUR NEGATIVE 08/07/2018 2237   PROTEINUR NEGATIVE 08/07/2018 2237   NITRITE POSITIVE (A) 08/07/2018 2237   LEUKOCYTESUR NEGATIVE 08/07/2018 2237   Sepsis Labs Invalid input(s): PROCALCITONIN,  WBC,  LACTICIDVEN Microbiology Recent Results (from the past 240 hour(s))  Novel Coronavirus,NAA,(SEND-OUT TO REF LAB - TAT 24-48 hrs); Hosp Order     Status: None   Collection Time: 08/03/18  3:47 PM   Specimen: Nasopharyngeal Swab; Respiratory  Result Value Ref Range Status   SARS-CoV-2, NAA NOT DETECTED NOT DETECTED Final    Comment: (NOTE) This test was developed and its performance characteristics determined by [employer]. This test has not been FDA cleared or approved. This test has been authorized by FDA under an Emergency Use Authorization (EUA). This test is only authorized for the duration of time the declaration that circumstances exist justifying the authorization of the emergency use of in vitro diagnostic tests for  detection of SARS-CoV-2 virus and/or diagnosis of COVID-19 infection under section 564(b)(1) of the Act, 21 U.S.C. 081KGY-1(E)(5), unless the authorization is terminated or revoked sooner. When diagnostic testing is negative, the possibility of a false negative result should be  considered in the context of a patient's recent exposures and the presence of clinical signs and symptoms consistent with COVID-19. An individual without symptoms of COVID-19 and who is not shedding SARS-CoV-2 virus would expect to have a negative (not detected) result in this assay. Performed  At: [facility] [place], [place] [id number] Rush Farmer MD VZ:[id number]    [place]  Final    Comment: Performed at [facility], [address], [place], [place] [address]     Time coordinating discharge 33  minutes  SIGNED:   Georgette Shell, MD  [facility] 08/08/2018, 11:56 AM Pager   If 7PM-7AM, please contact night-coverage [email or web address] Password [email or web address]

## 2018-08-08 NOTE — Progress Notes (Signed)
Occupational Therapy Evaluation Patient Details Name: Sabrina Taylor MRN: [id number] DOB: 07-15-1945 Today's Date: 08/08/2018    History of Present Illness Patient is a 73 y/o female presening to the ED on 08/08/2018 with primary complaints of Dizziness and high blood pressure. Of note recent hospital admission at [facility] for CVA. PMH significant of DDD, type 2 diabetes, first-degree AV block, hyperlipidemia, hypertension, LBBB, obesity, PAF, history of recent CVA. CT head showed previous known CVA, but no new areas of ischemia.     Clinical Impression   PTA, pt was living at home with her husband, and was independent with ADL/IADL and modified independent with functional mobility at RW level. Pt and her husband babysit their two 41mo grandchildren. Pt currently requires S for UB and LB dressing. Pt requires minguard for shower transfers and S for functional mobility at RW level. Pt reports occasional dizziness which make her concerned about her ability to care for her grandchildren. Discussed compensatory and adaptive strategies to safely care for her grandchildren. Patient evaluated by Occupational Therapy with no further acute OT needs identified. All education has been completed and the patient has no further questions. See below for any follow-up Occupational Therapy or equipment needs. OT to sign off. Thank you for referral.      Follow Up Recommendations  No OT follow up    Equipment Recommendations  None recommended by OT    Recommendations for Other Services       Precautions / Restrictions Precautions Precautions: Fall Precaution Comments: intermittent dizziness Restrictions Weight Bearing Restrictions: No      Mobility Bed Mobility Overal bed mobility: Modified Independent             General bed mobility comments: pt sitting on bed upon arrival;returned to supine mod Independent  Transfers Overall transfer level: Needs assistance Equipment used: Rolling walker  (2 wheeled)             General transfer comment: initial unsteadiness, use of RW increased pt's confidence with ambulation and provided stability     Balance Overall balance assessment: Needs assistance Sitting-balance support: No upper extremity supported;Feet supported Sitting balance-Leahy Scale: Good     Standing balance support: During functional activity;No upper extremity supported Standing balance-Leahy Scale: Fair Standing balance comment: able to dress self static standing;fair with RW requiring only supervision with straightline and turning                           ADL either performed or assessed with clinical judgement   ADL Overall ADL's : Needs assistance/impaired Eating/Feeding: Modified independent   Grooming: Supervision/safety;Standing   Upper Body Bathing: Supervision/ safety;Sitting   Lower Body Bathing: Supervison/ safety;Sit to/from stand   Upper Body Dressing : Supervision/safety;Standing Upper Body Dressing Details (indicate cue type and reason): donned shirt while standing Lower Body Dressing: Supervision/safety;Sit to/from stand Lower Body Dressing Details (indicate cue type and reason): donned pants and shoes while sitting EOB  Toilet Transfer: RW;Ambulation;Supervision/safety Toilet Transfer Details (indicate cue type and reason): simulated sit<>stand from/return to EOB with in room ambulation Toileting- Clothing Manipulation and Hygiene: Supervision/safety   Tub/ Shower Transfer: Min guard   Functional mobility during ADLs: Supervision/safety;Rolling walker General ADL Comments: pt donned UB and LB clothing      Vision Baseline Vision/History: Wears glasses Wears Glasses: At all times Patient Visual Report: No change from baseline Vision Assessment?: No apparent visual deficits     Perception  Praxis      Pertinent Vitals/Pain Pain Assessment: No/denies pain     Hand Dominance Right   Extremity/Trunk  Assessment Upper Extremity Assessment Upper Extremity Assessment: Overall WFL for tasks assessed   Lower Extremity Assessment Lower Extremity Assessment: Defer to PT evaluation   Cervical / Trunk Assessment Cervical / Trunk Assessment: Normal   Communication Communication Communication: No difficulties   Cognition Arousal/Alertness: Awake/alert Behavior During Therapy: WFL for tasks assessed/performed Overall Cognitive Status: Within Functional Limits for tasks assessed                                 General Comments: verbalized good awareness of stroke signs and symptoms;reports she read all handouts provided to her    General Comments       Exercises     Shoulder Instructions      Home Living Family/patient expects to be discharged to:: Private residence Living Arrangements: Spouse/significant other;Children Available Help at Discharge: Family;Available 24 hours/day Type of Home: House Home Access: Stairs to enter [employer] of Steps: 4 Entrance Stairs-Rails: Left Home Layout: Two level(freezer is only thing in basement) Alternate Level Stairs-Number of Steps: 12 with landing then 3 more   Bathroom Shower/Tub: Occupational psychologist: Handicapped height     Home Equipment: Environmental consultant - 2 wheels;Cane - single point;Grab bars - toilet;Grab bars - tub/shower(has reacher)          Prior Functioning/Environment Level of Independence: Independent        Comments: driving;pt and husband assist babysit grandchildren 62 7month oldsreports use of RW since last stroke;shopping until COVID, now daughter assists with shopping         OT Problem List: Decreased activity tolerance;Impaired balance (sitting and/or standing)      OT Treatment/Interventions:      OT Goals(Current goals can be found in the care plan section) Acute Rehab OT Goals Patient Stated Goal: be able to take care of grand children OT Goal Formulation: With  patient Time For Goal Achievement: 08/22/18 Potential to Achieve Goals: Good  OT Frequency:     Barriers to D/C:            Co-evaluation              AM-PAC OT "6 Clicks" Daily Activity     Outcome Measure Help from another person eating meals?: None Help from another person taking care of personal grooming?: None Help from another person toileting, which includes using toliet, bedpan, or urinal?: None Help from another person bathing (including washing, rinsing, drying)?: A Little Help from another person to put on and taking off regular upper body clothing?: None Help from another person to put on and taking off regular lower body clothing?: None 6 Click Score: 23   End of Session Equipment Utilized During Treatment: Gait belt Nurse Communication: Mobility status  Activity Tolerance: Patient tolerated treatment well Patient left: with call bell/phone within reach;in bed  OT Visit Diagnosis: Muscle weakness (generalized) (M62.81);Other abnormalities of gait and mobility (R26.89)                Time: 2505-3976 OT Time Calculation (min): 20 min Charges:  OT General Charges $OT Visit: 1 Visit OT Evaluation $OT Eval Low Complexity: [place] OTR/L Acute Rehabilitation Services Office: [place] 08/08/2018, 12:41 PM

## 2018-08-08 NOTE — ED Notes (Signed)
Discussed admission with husband- nathan Dziedzic.

## 2018-08-09 ENCOUNTER — Other Ambulatory Visit: Payer: Self-pay | Admitting: *Deleted

## 2018-08-09 DIAGNOSIS — I69398 Other sequelae of cerebral infarction: Secondary | ICD-10-CM | POA: Diagnosis not present

## 2018-08-09 DIAGNOSIS — I10 Essential (primary) hypertension: Secondary | ICD-10-CM | POA: Diagnosis not present

## 2018-08-09 DIAGNOSIS — E119 Type 2 diabetes mellitus without complications: Secondary | ICD-10-CM | POA: Diagnosis not present

## 2018-08-09 DIAGNOSIS — I480 Paroxysmal atrial fibrillation: Secondary | ICD-10-CM | POA: Diagnosis not present

## 2018-08-09 DIAGNOSIS — I2699 Other pulmonary embolism without acute cor pulmonale: Secondary | ICD-10-CM | POA: Diagnosis not present

## 2018-08-09 DIAGNOSIS — E785 Hyperlipidemia, unspecified: Secondary | ICD-10-CM | POA: Diagnosis not present

## 2018-08-09 DIAGNOSIS — I447 Left bundle-branch block, unspecified: Secondary | ICD-10-CM | POA: Diagnosis not present

## 2018-08-09 DIAGNOSIS — M4726 Other spondylosis with radiculopathy, lumbar region: Secondary | ICD-10-CM | POA: Diagnosis not present

## 2018-08-09 DIAGNOSIS — I440 Atrioventricular block, first degree: Secondary | ICD-10-CM | POA: Diagnosis not present

## 2018-08-09 LAB — NOVEL CORONAVIRUS, NAA (HOSP ORDER, SEND-OUT TO REF LAB; TAT 18-24 HRS): SARS-CoV-2, NAA: NOT DETECTED

## 2018-08-09 NOTE — Patient Outreach (Signed)
[facility]) Care Management  08/09/2018  Sabrina Taylor 1945-10-19 [id number]   EMMI-general discharge  RED ON EMMI ALERT Day # 1 Date: saturday 08/07/18 [place] Reason: Taking meds? No Insurance: [employer] medicare  [facility] admissions x 2  ED visits x 2 in the last 6 months    Outreach attempt # 1 Patient is able to verify HIPAA [facility] Care Management RN reviewed and addressed red alert with patient   EMMI:  Taking meds? Sabrina Taylor reports the recorded answer is incorrect She reports that she has filled and picked up all her prescription medications and is taking them as ordered per the after summary sheets review  Transition of care services noted to be completed by primary care MD office staff - [facility]   Social: Sabrina Taylor lives with her husband She does not have issues with self care nor transportation concerns at this time    Conditions: LBBB,  CVA, PAF, HTN, DM, piriformis syndrome of right side, HLD, obesity, lumbar back pain, greater trochanteric bursitis of left hip injury of left rotator cuff,   DME: walker  Medications: She denies concerns with taking medications as prescribed, affording medications, side effects of medications and questions about medications   Advance Directives: has a HPOA and living will    Consent: [facility] RN CM reviewed [facility] services with patient. Patient gave verbal consent for services [facility] telephonic RN CM.   Advised patient that there will be further automated EMMI- post discharge calls to assess how the patient is doing following the recent hospitalization Advised the patient that another call may be received from a nurse if any of their responses were abnormal. Patient voiced understanding and was appreciative of f/u call.   Plan: [facility] RN CM will close case at this time as patient has been assessed and no needs identified/needs resolved.   Pt encouraged to return a call to [place] CM prn  [facility] RN CM sent a  successful outreach letter as discussed with [facility] brochure enclosed for review   Sabrina L. Lavina Hamman, RN, BSN, [place] Coordinator Office number [phone] Mobile number [phone]  Main [facility] number [phone] Fax number [phone]

## 2018-08-12 DIAGNOSIS — R42 Dizziness and giddiness: Secondary | ICD-10-CM | POA: Diagnosis not present

## 2018-08-12 DIAGNOSIS — I639 Cerebral infarction, unspecified: Secondary | ICD-10-CM | POA: Diagnosis not present

## 2018-08-12 DIAGNOSIS — Z0001 Encounter for general adult medical examination with abnormal findings: Secondary | ICD-10-CM | POA: Diagnosis not present

## 2018-08-12 DIAGNOSIS — N342 Other urethritis: Secondary | ICD-10-CM | POA: Diagnosis not present

## 2018-08-12 DIAGNOSIS — Z6830 Body mass index (BMI) 30.0-30.9, adult: Secondary | ICD-10-CM | POA: Diagnosis not present

## 2018-08-13 DIAGNOSIS — I447 Left bundle-branch block, unspecified: Secondary | ICD-10-CM | POA: Diagnosis not present

## 2018-08-13 DIAGNOSIS — I440 Atrioventricular block, first degree: Secondary | ICD-10-CM | POA: Diagnosis not present

## 2018-08-13 DIAGNOSIS — M4726 Other spondylosis with radiculopathy, lumbar region: Secondary | ICD-10-CM | POA: Diagnosis not present

## 2018-08-13 DIAGNOSIS — I480 Paroxysmal atrial fibrillation: Secondary | ICD-10-CM | POA: Diagnosis not present

## 2018-08-13 DIAGNOSIS — I69398 Other sequelae of cerebral infarction: Secondary | ICD-10-CM | POA: Diagnosis not present

## 2018-08-13 DIAGNOSIS — E785 Hyperlipidemia, unspecified: Secondary | ICD-10-CM | POA: Diagnosis not present

## 2018-08-13 DIAGNOSIS — I2699 Other pulmonary embolism without acute cor pulmonale: Secondary | ICD-10-CM | POA: Diagnosis not present

## 2018-08-13 DIAGNOSIS — E119 Type 2 diabetes mellitus without complications: Secondary | ICD-10-CM | POA: Diagnosis not present

## 2018-08-13 DIAGNOSIS — I10 Essential (primary) hypertension: Secondary | ICD-10-CM | POA: Diagnosis not present

## 2018-08-17 DIAGNOSIS — E119 Type 2 diabetes mellitus without complications: Secondary | ICD-10-CM | POA: Diagnosis not present

## 2018-08-17 DIAGNOSIS — E785 Hyperlipidemia, unspecified: Secondary | ICD-10-CM | POA: Diagnosis not present

## 2018-08-17 DIAGNOSIS — I447 Left bundle-branch block, unspecified: Secondary | ICD-10-CM | POA: Diagnosis not present

## 2018-08-17 DIAGNOSIS — I69398 Other sequelae of cerebral infarction: Secondary | ICD-10-CM | POA: Diagnosis not present

## 2018-08-17 DIAGNOSIS — I2699 Other pulmonary embolism without acute cor pulmonale: Secondary | ICD-10-CM | POA: Diagnosis not present

## 2018-08-17 DIAGNOSIS — M4726 Other spondylosis with radiculopathy, lumbar region: Secondary | ICD-10-CM | POA: Diagnosis not present

## 2018-08-17 DIAGNOSIS — I480 Paroxysmal atrial fibrillation: Secondary | ICD-10-CM | POA: Diagnosis not present

## 2018-08-17 DIAGNOSIS — I10 Essential (primary) hypertension: Secondary | ICD-10-CM | POA: Diagnosis not present

## 2018-08-17 DIAGNOSIS — I440 Atrioventricular block, first degree: Secondary | ICD-10-CM | POA: Diagnosis not present

## 2018-08-18 DIAGNOSIS — Z6831 Body mass index (BMI) 31.0-31.9, adult: Secondary | ICD-10-CM | POA: Diagnosis not present

## 2018-08-18 DIAGNOSIS — I10 Essential (primary) hypertension: Secondary | ICD-10-CM | POA: Diagnosis not present

## 2018-08-18 DIAGNOSIS — E6609 Other obesity due to excess calories: Secondary | ICD-10-CM | POA: Diagnosis not present

## 2018-08-19 DIAGNOSIS — I2699 Other pulmonary embolism without acute cor pulmonale: Secondary | ICD-10-CM | POA: Diagnosis not present

## 2018-08-19 DIAGNOSIS — I440 Atrioventricular block, first degree: Secondary | ICD-10-CM | POA: Diagnosis not present

## 2018-08-19 DIAGNOSIS — I480 Paroxysmal atrial fibrillation: Secondary | ICD-10-CM | POA: Diagnosis not present

## 2018-08-19 DIAGNOSIS — I447 Left bundle-branch block, unspecified: Secondary | ICD-10-CM | POA: Diagnosis not present

## 2018-08-19 DIAGNOSIS — I69398 Other sequelae of cerebral infarction: Secondary | ICD-10-CM | POA: Diagnosis not present

## 2018-08-19 DIAGNOSIS — M4726 Other spondylosis with radiculopathy, lumbar region: Secondary | ICD-10-CM | POA: Diagnosis not present

## 2018-08-19 DIAGNOSIS — E785 Hyperlipidemia, unspecified: Secondary | ICD-10-CM | POA: Diagnosis not present

## 2018-08-19 DIAGNOSIS — E119 Type 2 diabetes mellitus without complications: Secondary | ICD-10-CM | POA: Diagnosis not present

## 2018-08-19 DIAGNOSIS — I10 Essential (primary) hypertension: Secondary | ICD-10-CM | POA: Diagnosis not present

## 2018-08-30 DIAGNOSIS — I440 Atrioventricular block, first degree: Secondary | ICD-10-CM | POA: Diagnosis not present

## 2018-08-30 DIAGNOSIS — E119 Type 2 diabetes mellitus without complications: Secondary | ICD-10-CM | POA: Diagnosis not present

## 2018-08-30 DIAGNOSIS — M4726 Other spondylosis with radiculopathy, lumbar region: Secondary | ICD-10-CM | POA: Diagnosis not present

## 2018-08-30 DIAGNOSIS — I2699 Other pulmonary embolism without acute cor pulmonale: Secondary | ICD-10-CM | POA: Diagnosis not present

## 2018-08-30 DIAGNOSIS — I69398 Other sequelae of cerebral infarction: Secondary | ICD-10-CM | POA: Diagnosis not present

## 2018-08-30 DIAGNOSIS — G603 Idiopathic progressive neuropathy: Secondary | ICD-10-CM | POA: Diagnosis not present

## 2018-08-30 DIAGNOSIS — I447 Left bundle-branch block, unspecified: Secondary | ICD-10-CM | POA: Diagnosis not present

## 2018-08-30 DIAGNOSIS — E785 Hyperlipidemia, unspecified: Secondary | ICD-10-CM | POA: Diagnosis not present

## 2018-08-30 DIAGNOSIS — I480 Paroxysmal atrial fibrillation: Secondary | ICD-10-CM | POA: Diagnosis not present

## 2018-08-30 DIAGNOSIS — I69393 Ataxia following cerebral infarction: Secondary | ICD-10-CM | POA: Diagnosis not present

## 2018-08-30 DIAGNOSIS — M130 Polyarthritis, unspecified: Secondary | ICD-10-CM | POA: Diagnosis not present

## 2018-08-30 DIAGNOSIS — I10 Essential (primary) hypertension: Secondary | ICD-10-CM | POA: Diagnosis not present

## 2018-09-07 DIAGNOSIS — E119 Type 2 diabetes mellitus without complications: Secondary | ICD-10-CM | POA: Diagnosis not present

## 2018-09-07 DIAGNOSIS — I69398 Other sequelae of cerebral infarction: Secondary | ICD-10-CM | POA: Diagnosis not present

## 2018-09-07 DIAGNOSIS — M4726 Other spondylosis with radiculopathy, lumbar region: Secondary | ICD-10-CM | POA: Diagnosis not present

## 2018-09-07 DIAGNOSIS — I440 Atrioventricular block, first degree: Secondary | ICD-10-CM | POA: Diagnosis not present

## 2018-09-07 DIAGNOSIS — I10 Essential (primary) hypertension: Secondary | ICD-10-CM | POA: Diagnosis not present

## 2018-09-07 DIAGNOSIS — I2699 Other pulmonary embolism without acute cor pulmonale: Secondary | ICD-10-CM | POA: Diagnosis not present

## 2018-09-07 DIAGNOSIS — I480 Paroxysmal atrial fibrillation: Secondary | ICD-10-CM | POA: Diagnosis not present

## 2018-09-07 DIAGNOSIS — E785 Hyperlipidemia, unspecified: Secondary | ICD-10-CM | POA: Diagnosis not present

## 2018-09-07 DIAGNOSIS — I447 Left bundle-branch block, unspecified: Secondary | ICD-10-CM | POA: Diagnosis not present

## 2018-09-09 DIAGNOSIS — Z0001 Encounter for general adult medical examination with abnormal findings: Secondary | ICD-10-CM | POA: Diagnosis not present

## 2018-09-09 DIAGNOSIS — I639 Cerebral infarction, unspecified: Secondary | ICD-10-CM | POA: Diagnosis not present

## 2018-09-17 ENCOUNTER — Other Ambulatory Visit: Payer: Self-pay

## 2018-09-17 DIAGNOSIS — R6889 Other general symptoms and signs: Secondary | ICD-10-CM | POA: Diagnosis not present

## 2018-09-17 DIAGNOSIS — Z20822 Contact with and (suspected) exposure to covid-19: Secondary | ICD-10-CM

## 2018-09-20 LAB — NOVEL CORONAVIRUS, NAA: SARS-CoV-2, NAA: NOT DETECTED

## 2018-09-21 DIAGNOSIS — E119 Type 2 diabetes mellitus without complications: Secondary | ICD-10-CM | POA: Diagnosis not present

## 2018-09-21 DIAGNOSIS — I10 Essential (primary) hypertension: Secondary | ICD-10-CM | POA: Diagnosis not present

## 2018-09-21 DIAGNOSIS — E785 Hyperlipidemia, unspecified: Secondary | ICD-10-CM | POA: Diagnosis not present

## 2018-09-21 DIAGNOSIS — I69398 Other sequelae of cerebral infarction: Secondary | ICD-10-CM | POA: Diagnosis not present

## 2018-09-21 DIAGNOSIS — M4726 Other spondylosis with radiculopathy, lumbar region: Secondary | ICD-10-CM | POA: Diagnosis not present

## 2018-09-21 DIAGNOSIS — I440 Atrioventricular block, first degree: Secondary | ICD-10-CM | POA: Diagnosis not present

## 2018-09-21 DIAGNOSIS — I2699 Other pulmonary embolism without acute cor pulmonale: Secondary | ICD-10-CM | POA: Diagnosis not present

## 2018-09-21 DIAGNOSIS — I447 Left bundle-branch block, unspecified: Secondary | ICD-10-CM | POA: Diagnosis not present

## 2018-09-21 DIAGNOSIS — I480 Paroxysmal atrial fibrillation: Secondary | ICD-10-CM | POA: Diagnosis not present

## 2018-09-29 DIAGNOSIS — I10 Essential (primary) hypertension: Secondary | ICD-10-CM | POA: Diagnosis not present

## 2018-09-29 DIAGNOSIS — I69393 Ataxia following cerebral infarction: Secondary | ICD-10-CM | POA: Diagnosis not present

## 2018-09-29 DIAGNOSIS — M130 Polyarthritis, unspecified: Secondary | ICD-10-CM | POA: Diagnosis not present

## 2018-09-29 DIAGNOSIS — G603 Idiopathic progressive neuropathy: Secondary | ICD-10-CM | POA: Diagnosis not present

## 2019-02-24 DIAGNOSIS — F4542 Pain disorder with related psychological factors: Secondary | ICD-10-CM | POA: Diagnosis not present

## 2019-02-24 DIAGNOSIS — G8929 Other chronic pain: Secondary | ICD-10-CM | POA: Diagnosis not present

## 2019-02-24 DIAGNOSIS — E7849 Other hyperlipidemia: Secondary | ICD-10-CM | POA: Diagnosis not present

## 2019-02-24 DIAGNOSIS — I10 Essential (primary) hypertension: Secondary | ICD-10-CM | POA: Diagnosis not present

## 2019-04-04 DIAGNOSIS — I10 Essential (primary) hypertension: Secondary | ICD-10-CM | POA: Diagnosis not present

## 2019-04-04 DIAGNOSIS — Z6831 Body mass index (BMI) 31.0-31.9, adult: Secondary | ICD-10-CM | POA: Diagnosis not present

## 2019-04-04 DIAGNOSIS — E6609 Other obesity due to excess calories: Secondary | ICD-10-CM | POA: Diagnosis not present

## 2019-04-04 DIAGNOSIS — Z1389 Encounter for screening for other disorder: Secondary | ICD-10-CM | POA: Diagnosis not present

## 2019-04-04 DIAGNOSIS — R600 Localized edema: Secondary | ICD-10-CM | POA: Diagnosis not present

## 2019-04-05 ENCOUNTER — Ambulatory Visit: Payer: Medicare HMO | Attending: Internal Medicine

## 2019-04-07 ENCOUNTER — Ambulatory Visit: Payer: Medicare HMO

## 2019-04-24 DIAGNOSIS — I10 Essential (primary) hypertension: Secondary | ICD-10-CM | POA: Diagnosis not present

## 2019-04-24 DIAGNOSIS — G8929 Other chronic pain: Secondary | ICD-10-CM | POA: Diagnosis not present

## 2019-04-24 DIAGNOSIS — F4542 Pain disorder with related psychological factors: Secondary | ICD-10-CM | POA: Diagnosis not present

## 2019-04-24 DIAGNOSIS — E7849 Other hyperlipidemia: Secondary | ICD-10-CM | POA: Diagnosis not present

## 2019-05-25 DIAGNOSIS — F4542 Pain disorder with related psychological factors: Secondary | ICD-10-CM | POA: Diagnosis not present

## 2019-05-25 DIAGNOSIS — E7849 Other hyperlipidemia: Secondary | ICD-10-CM | POA: Diagnosis not present

## 2019-05-25 DIAGNOSIS — I10 Essential (primary) hypertension: Secondary | ICD-10-CM | POA: Diagnosis not present

## 2019-05-25 DIAGNOSIS — G894 Chronic pain syndrome: Secondary | ICD-10-CM | POA: Diagnosis not present

## 2019-06-24 DIAGNOSIS — G8929 Other chronic pain: Secondary | ICD-10-CM | POA: Diagnosis not present

## 2019-06-24 DIAGNOSIS — I10 Essential (primary) hypertension: Secondary | ICD-10-CM | POA: Diagnosis not present

## 2019-06-24 DIAGNOSIS — F4542 Pain disorder with related psychological factors: Secondary | ICD-10-CM | POA: Diagnosis not present

## 2019-06-24 DIAGNOSIS — E7849 Other hyperlipidemia: Secondary | ICD-10-CM | POA: Diagnosis not present

## 2019-07-25 DIAGNOSIS — F4542 Pain disorder with related psychological factors: Secondary | ICD-10-CM | POA: Diagnosis not present

## 2019-07-25 DIAGNOSIS — E7849 Other hyperlipidemia: Secondary | ICD-10-CM | POA: Diagnosis not present

## 2019-07-25 DIAGNOSIS — G8929 Other chronic pain: Secondary | ICD-10-CM | POA: Diagnosis not present

## 2019-07-25 DIAGNOSIS — I10 Essential (primary) hypertension: Secondary | ICD-10-CM | POA: Diagnosis not present

## 2019-08-24 DIAGNOSIS — E1140 Type 2 diabetes mellitus with diabetic neuropathy, unspecified: Secondary | ICD-10-CM | POA: Diagnosis not present

## 2019-08-24 DIAGNOSIS — I10 Essential (primary) hypertension: Secondary | ICD-10-CM | POA: Diagnosis not present

## 2019-08-24 DIAGNOSIS — G8929 Other chronic pain: Secondary | ICD-10-CM | POA: Diagnosis not present

## 2019-08-24 DIAGNOSIS — Z8673 Personal history of transient ischemic attack (TIA), and cerebral infarction without residual deficits: Secondary | ICD-10-CM | POA: Diagnosis not present

## 2019-09-23 DIAGNOSIS — E1140 Type 2 diabetes mellitus with diabetic neuropathy, unspecified: Secondary | ICD-10-CM | POA: Diagnosis not present

## 2019-09-23 DIAGNOSIS — I10 Essential (primary) hypertension: Secondary | ICD-10-CM | POA: Diagnosis not present

## 2019-09-23 DIAGNOSIS — Z8673 Personal history of transient ischemic attack (TIA), and cerebral infarction without residual deficits: Secondary | ICD-10-CM | POA: Diagnosis not present

## 2019-09-23 DIAGNOSIS — E7849 Other hyperlipidemia: Secondary | ICD-10-CM | POA: Diagnosis not present

## 2019-10-25 DIAGNOSIS — Z8673 Personal history of transient ischemic attack (TIA), and cerebral infarction without residual deficits: Secondary | ICD-10-CM | POA: Diagnosis not present

## 2019-10-25 DIAGNOSIS — E1140 Type 2 diabetes mellitus with diabetic neuropathy, unspecified: Secondary | ICD-10-CM | POA: Diagnosis not present

## 2019-10-25 DIAGNOSIS — I10 Essential (primary) hypertension: Secondary | ICD-10-CM | POA: Diagnosis not present

## 2019-10-25 DIAGNOSIS — E7849 Other hyperlipidemia: Secondary | ICD-10-CM | POA: Diagnosis not present

## 2019-11-24 DIAGNOSIS — I10 Essential (primary) hypertension: Secondary | ICD-10-CM | POA: Diagnosis not present

## 2019-11-24 DIAGNOSIS — E7849 Other hyperlipidemia: Secondary | ICD-10-CM | POA: Diagnosis not present

## 2019-11-24 DIAGNOSIS — G8929 Other chronic pain: Secondary | ICD-10-CM | POA: Diagnosis not present

## 2019-11-24 DIAGNOSIS — F4542 Pain disorder with related psychological factors: Secondary | ICD-10-CM | POA: Diagnosis not present

## 2019-12-19 DIAGNOSIS — Z6830 Body mass index (BMI) 30.0-30.9, adult: Secondary | ICD-10-CM | POA: Diagnosis not present

## 2019-12-19 DIAGNOSIS — Z0000 Encounter for general adult medical examination without abnormal findings: Secondary | ICD-10-CM | POA: Diagnosis not present

## 2019-12-19 DIAGNOSIS — Z1331 Encounter for screening for depression: Secondary | ICD-10-CM | POA: Diagnosis not present

## 2019-12-19 DIAGNOSIS — Z0001 Encounter for general adult medical examination with abnormal findings: Secondary | ICD-10-CM | POA: Diagnosis not present

## 2019-12-19 DIAGNOSIS — E7849 Other hyperlipidemia: Secondary | ICD-10-CM | POA: Diagnosis not present

## 2019-12-19 DIAGNOSIS — M7541 Impingement syndrome of right shoulder: Secondary | ICD-10-CM | POA: Diagnosis not present

## 2019-12-19 DIAGNOSIS — E119 Type 2 diabetes mellitus without complications: Secondary | ICD-10-CM | POA: Diagnosis not present

## 2019-12-19 DIAGNOSIS — E559 Vitamin D deficiency, unspecified: Secondary | ICD-10-CM | POA: Diagnosis not present

## 2019-12-19 DIAGNOSIS — Z23 Encounter for immunization: Secondary | ICD-10-CM | POA: Diagnosis not present

## 2019-12-19 DIAGNOSIS — E782 Mixed hyperlipidemia: Secondary | ICD-10-CM | POA: Diagnosis not present

## 2019-12-19 DIAGNOSIS — E6609 Other obesity due to excess calories: Secondary | ICD-10-CM | POA: Diagnosis not present

## 2019-12-24 DIAGNOSIS — E7849 Other hyperlipidemia: Secondary | ICD-10-CM | POA: Diagnosis not present

## 2019-12-24 DIAGNOSIS — I10 Essential (primary) hypertension: Secondary | ICD-10-CM | POA: Diagnosis not present

## 2019-12-24 DIAGNOSIS — F4542 Pain disorder with related psychological factors: Secondary | ICD-10-CM | POA: Diagnosis not present

## 2019-12-24 DIAGNOSIS — G8929 Other chronic pain: Secondary | ICD-10-CM | POA: Diagnosis not present

## 2020-01-05 ENCOUNTER — Ambulatory Visit: Payer: Medicare HMO | Attending: Internal Medicine

## 2020-01-05 DIAGNOSIS — Z23 Encounter for immunization: Secondary | ICD-10-CM

## 2020-01-05 NOTE — Progress Notes (Signed)
   Covid-19 Vaccination Clinic  Name:  Sabrina Taylor    MRN: [id number] DOB: 12-20-1945  01/05/2020  Sabrina Taylor was observed post Covid-19 immunization for 15 minutes without incident. She was provided with Vaccine Information Sheet and instruction to access the V-Safe system.   Sabrina Taylor was instructed to call 911 with any severe reactions post vaccine: Marland Kitchen Difficulty breathing  . Swelling of face and throat  . A fast heartbeat  . A bad rash all over body  . Dizziness and weakness

## 2020-01-24 DIAGNOSIS — F4542 Pain disorder with related psychological factors: Secondary | ICD-10-CM | POA: Diagnosis not present

## 2020-01-24 DIAGNOSIS — I10 Essential (primary) hypertension: Secondary | ICD-10-CM | POA: Diagnosis not present

## 2020-01-24 DIAGNOSIS — E7849 Other hyperlipidemia: Secondary | ICD-10-CM | POA: Diagnosis not present

## 2020-01-24 DIAGNOSIS — G8929 Other chronic pain: Secondary | ICD-10-CM | POA: Diagnosis not present

## 2020-02-22 ENCOUNTER — Other Ambulatory Visit (HOSPITAL_COMMUNITY): Payer: Self-pay | Admitting: Nephrology

## 2020-02-22 ENCOUNTER — Other Ambulatory Visit: Payer: Self-pay | Admitting: Nephrology

## 2020-02-22 DIAGNOSIS — Z7189 Other specified counseling: Secondary | ICD-10-CM | POA: Diagnosis not present

## 2020-02-22 DIAGNOSIS — I129 Hypertensive chronic kidney disease with stage 1 through stage 4 chronic kidney disease, or unspecified chronic kidney disease: Secondary | ICD-10-CM

## 2020-02-22 DIAGNOSIS — I5032 Chronic diastolic (congestive) heart failure: Secondary | ICD-10-CM | POA: Diagnosis not present

## 2020-02-22 DIAGNOSIS — E1122 Type 2 diabetes mellitus with diabetic chronic kidney disease: Secondary | ICD-10-CM | POA: Diagnosis not present

## 2020-02-22 DIAGNOSIS — N2581 Secondary hyperparathyroidism of renal origin: Secondary | ICD-10-CM | POA: Diagnosis not present

## 2020-02-22 DIAGNOSIS — Z79899 Other long term (current) drug therapy: Secondary | ICD-10-CM | POA: Diagnosis not present

## 2020-02-22 DIAGNOSIS — E870 Hyperosmolality and hypernatremia: Secondary | ICD-10-CM | POA: Diagnosis not present

## 2020-02-22 DIAGNOSIS — E6609 Other obesity due to excess calories: Secondary | ICD-10-CM | POA: Diagnosis not present

## 2020-02-22 DIAGNOSIS — N1832 Chronic kidney disease, stage 3b: Secondary | ICD-10-CM | POA: Diagnosis not present

## 2020-02-22 DIAGNOSIS — N189 Chronic kidney disease, unspecified: Secondary | ICD-10-CM | POA: Diagnosis not present

## 2020-02-22 DIAGNOSIS — I639 Cerebral infarction, unspecified: Secondary | ICD-10-CM | POA: Diagnosis not present

## 2020-02-24 DIAGNOSIS — G8929 Other chronic pain: Secondary | ICD-10-CM | POA: Diagnosis not present

## 2020-02-24 DIAGNOSIS — I10 Essential (primary) hypertension: Secondary | ICD-10-CM | POA: Diagnosis not present

## 2020-02-24 DIAGNOSIS — F4542 Pain disorder with related psychological factors: Secondary | ICD-10-CM | POA: Diagnosis not present

## 2020-02-24 DIAGNOSIS — E7849 Other hyperlipidemia: Secondary | ICD-10-CM | POA: Diagnosis not present

## 2020-03-14 ENCOUNTER — Ambulatory Visit (HOSPITAL_COMMUNITY)
Admission: RE | Admit: 2020-03-14 | Discharge: 2020-03-14 | Disposition: A | Discharge status: Home / Self Care | Payer: Medicare HMO | Source: Ambulatory Visit | Attending: Nephrology | Admitting: Nephrology

## 2020-03-14 ENCOUNTER — Other Ambulatory Visit: Payer: Self-pay

## 2020-03-14 DIAGNOSIS — E1122 Type 2 diabetes mellitus with diabetic chronic kidney disease: Secondary | ICD-10-CM | POA: Insufficient documentation

## 2020-03-14 DIAGNOSIS — I129 Hypertensive chronic kidney disease with stage 1 through stage 4 chronic kidney disease, or unspecified chronic kidney disease: Secondary | ICD-10-CM | POA: Diagnosis not present

## 2020-03-14 DIAGNOSIS — Z79899 Other long term (current) drug therapy: Secondary | ICD-10-CM | POA: Insufficient documentation

## 2020-03-14 DIAGNOSIS — N189 Chronic kidney disease, unspecified: Secondary | ICD-10-CM | POA: Diagnosis not present

## 2020-03-19 DIAGNOSIS — I129 Hypertensive chronic kidney disease with stage 1 through stage 4 chronic kidney disease, or unspecified chronic kidney disease: Secondary | ICD-10-CM | POA: Diagnosis not present

## 2020-03-19 DIAGNOSIS — Z1159 Encounter for screening for other viral diseases: Secondary | ICD-10-CM | POA: Diagnosis not present

## 2020-03-19 DIAGNOSIS — E1122 Type 2 diabetes mellitus with diabetic chronic kidney disease: Secondary | ICD-10-CM | POA: Diagnosis not present

## 2020-03-19 DIAGNOSIS — I5032 Chronic diastolic (congestive) heart failure: Secondary | ICD-10-CM | POA: Diagnosis not present

## 2020-03-19 DIAGNOSIS — D631 Anemia in chronic kidney disease: Secondary | ICD-10-CM | POA: Diagnosis not present

## 2020-03-19 DIAGNOSIS — N189 Chronic kidney disease, unspecified: Secondary | ICD-10-CM | POA: Diagnosis not present

## 2020-03-19 DIAGNOSIS — Z79899 Other long term (current) drug therapy: Secondary | ICD-10-CM | POA: Diagnosis not present

## 2020-03-19 DIAGNOSIS — E559 Vitamin D deficiency, unspecified: Secondary | ICD-10-CM | POA: Diagnosis not present

## 2020-03-19 DIAGNOSIS — I639 Cerebral infarction, unspecified: Secondary | ICD-10-CM | POA: Diagnosis not present

## 2020-03-19 DIAGNOSIS — E870 Hyperosmolality and hypernatremia: Secondary | ICD-10-CM | POA: Diagnosis not present

## 2020-03-23 DIAGNOSIS — N1832 Chronic kidney disease, stage 3b: Secondary | ICD-10-CM | POA: Diagnosis not present

## 2020-03-23 DIAGNOSIS — I110 Hypertensive heart disease with heart failure: Secondary | ICD-10-CM | POA: Diagnosis not present

## 2020-03-23 DIAGNOSIS — R809 Proteinuria, unspecified: Secondary | ICD-10-CM | POA: Diagnosis not present

## 2020-03-23 DIAGNOSIS — N2581 Secondary hyperparathyroidism of renal origin: Secondary | ICD-10-CM | POA: Diagnosis not present

## 2020-03-23 DIAGNOSIS — E1122 Type 2 diabetes mellitus with diabetic chronic kidney disease: Secondary | ICD-10-CM | POA: Diagnosis not present

## 2020-03-23 DIAGNOSIS — I5030 Unspecified diastolic (congestive) heart failure: Secondary | ICD-10-CM | POA: Diagnosis not present

## 2020-03-23 DIAGNOSIS — N2889 Other specified disorders of kidney and ureter: Secondary | ICD-10-CM | POA: Diagnosis not present

## 2020-03-23 DIAGNOSIS — D472 Monoclonal gammopathy: Secondary | ICD-10-CM | POA: Diagnosis not present

## 2020-03-23 DIAGNOSIS — R768 Other specified abnormal immunological findings in serum: Secondary | ICD-10-CM | POA: Diagnosis not present

## 2020-03-23 DIAGNOSIS — R803 Bence Jones proteinuria: Secondary | ICD-10-CM | POA: Diagnosis not present

## 2020-03-23 DIAGNOSIS — N189 Chronic kidney disease, unspecified: Secondary | ICD-10-CM | POA: Diagnosis not present

## 2020-04-23 ENCOUNTER — Other Ambulatory Visit: Payer: Self-pay

## 2020-04-23 ENCOUNTER — Encounter (HOSPITAL_COMMUNITY): Payer: Self-pay | Admitting: Hematology

## 2020-04-23 ENCOUNTER — Inpatient Hospital Stay (HOSPITAL_COMMUNITY): Payer: Medicare HMO | Attending: Hematology | Admitting: Hematology

## 2020-04-23 VITALS — BP 163/74 | HR 59 | Temp 96.9°F | Resp 20 | Wt 203.5 lb

## 2020-04-23 DIAGNOSIS — R803 Bence Jones proteinuria: Secondary | ICD-10-CM | POA: Diagnosis not present

## 2020-04-23 DIAGNOSIS — Z8042 Family history of malignant neoplasm of prostate: Secondary | ICD-10-CM | POA: Insufficient documentation

## 2020-04-23 DIAGNOSIS — I10 Essential (primary) hypertension: Secondary | ICD-10-CM | POA: Diagnosis not present

## 2020-04-23 DIAGNOSIS — Z7984 Long term (current) use of oral hypoglycemic drugs: Secondary | ICD-10-CM | POA: Diagnosis not present

## 2020-04-23 DIAGNOSIS — D472 Monoclonal gammopathy: Secondary | ICD-10-CM | POA: Insufficient documentation

## 2020-04-23 DIAGNOSIS — F4542 Pain disorder with related psychological factors: Secondary | ICD-10-CM | POA: Diagnosis not present

## 2020-04-23 DIAGNOSIS — G8929 Other chronic pain: Secondary | ICD-10-CM | POA: Diagnosis not present

## 2020-04-23 DIAGNOSIS — E7849 Other hyperlipidemia: Secondary | ICD-10-CM | POA: Diagnosis not present

## 2020-04-23 DIAGNOSIS — N189 Chronic kidney disease, unspecified: Secondary | ICD-10-CM | POA: Insufficient documentation

## 2020-04-23 DIAGNOSIS — E119 Type 2 diabetes mellitus without complications: Secondary | ICD-10-CM | POA: Diagnosis not present

## 2020-04-23 NOTE — Progress Notes (Signed)
[facility] [place], [place] [address]   CLINIC:  Medical Oncology/Hematology  Patient Care Team: [place], Marinette as PCP - General (Family Medicine)  CHIEF COMPLAINTS/PURPOSE OF CONSULTATION:  Evaluation of kappa light chain MGUS  HISTORY OF PRESENTING ILLNESS:  Sabrina Taylor 75 y.o. female is here because of evaluation of kappa light chain MGUS, at the request of Dr. Ulice Bold.  Work-up for chronic kidney disease revealed Bence-Jones proteinuria, kappa type on urine immunofixation.  Serum protein electrophoresis was normal.  Free kappa light chains were elevated.  She does not report any new onset bone pains.  Today she reports feeling well.  She reports energy levels of 75% and Apatate of 100%.  She has chronic numbness in the toes which is stable.  Her father had prostate cancer, but denies family history of myeloma.   MEDICAL HISTORY:  Past Medical History:  Diagnosis Date  . DDD (degenerative disc disease)    Low back pain  . Diabetes mellitus without complication ([facility])   . First degree AV block   . Hyperlipidemia   . Hypertension   . LBBB (left bundle branch block)   . Obesity   . PAF (paroxysmal atrial fibrillation) ([facility])   . PONV (postoperative nausea and vomiting)   . Stroke ([facility]) 07/2018  . Vertigo following cerebrovascular accident 07/2018    SURGICAL HISTORY: Past Surgical History:  Procedure Laterality Date  . APPENDECTOMY  1960  . BACK SURGERY  may 2011  . CATARACT EXTRACTION W/PHACO Right 06/05/2014   Procedure: CATARACT EXTRACTION PHACO AND INTRAOCULAR LENS PLACEMENT (IOC);  Surgeon: Tonny Branch, MD;  Location: [facility];  Service: Ophthalmology;  Laterality: Right;  CDE: 6.42  . CATARACT EXTRACTION W/PHACO Left 06/19/2014   Procedure: CATARACT EXTRACTION PHACO AND INTRAOCULAR LENS PLACEMENT (IOC);  Surgeon: Tonny Branch, MD;  Location: [facility];  Service: Ophthalmology;  Laterality: Left;  CDE 5.61  .  CHOLECYSTECTOMY    . COLONOSCOPY  03/25/2011   Procedure: COLONOSCOPY;  Surgeon: Jamesetta So, MD;  Location: [facility];  Service: Gastroenterology;  Laterality: N/A;  . KNEE ARTHROSCOPY  2004, 2008   Right  . TOTAL ABDOMINAL HYSTERECTOMY      SOCIAL HISTORY: Social History   Socioeconomic History  . Marital status: Married    Spouse name: Not on file  . Number of children: Not on file  . Years of education: Not on file  . Highest education level: Not on file  Occupational History  . 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 Smoking status: Never Smoker  . Smokeless tobacco: Never Used  Substance and Sexual Activity  . Alcohol use: No  . Drug use: No  . Sexual activity: Not on file  Other Topics Concern  . Not on file  Social History Narrative   Married with one adult child, lives in [place]   No regular exercise   Social Determinants of Health   Financial Resource Strain: Not on file  Food Insecurity: Not on file  Transportation Needs: No Transportation Needs  . Lack of Transportation (Medical): No  . Lack of Transportation (Non-Medical): No  Physical Activity: Inactive  . Days of Exercise per Week: 0 days  . Minutes of Exercise per Session: 0 min  Stress: Not on file  Social Connections: Unknown  . Frequency of Communication with Friends and Family: More than three times a week  . Frequency of  Social Gatherings with Friends and Family: Three times a week  . Attends Religious Services: Not on file  . Active Member of Clubs or Organizations: No  . Attends Archivist Meetings: Never  . Marital Status: Married  Human resources officer Violence: Not At Risk  . Fear of Current or Ex-Partner: No  . Emotionally Abused: No  . Physically Abused: No  . Sexually Abused: No    FAMILY HISTORY: Family History  Problem Relation Age of Onset  . Hypertension Mother   . Alzheimer's disease Mother   . Stroke Father    . Coronary artery disease Brother     ALLERGIES:  is allergic to penicillins.  MEDICATIONS:  Current Outpatient Medications  Medication Sig Dispense Refill  . amLODipine (NORVASC) 5 MG tablet Take by mouth.    Marland Kitchen aspirin 81 MG EC tablet Take 1 tablet (81 mg total) by mouth daily with breakfast. Along with Plavix for 4 weeks then stop aspirin and take Plavix alone after 4 weeks 30 tablet 0  . atorvastatin (LIPITOR) 40 MG tablet Take 1 tablet (40 mg total) by mouth daily. 30 tablet 1  . Biotin 1000 MCG tablet Take by mouth.    . Calcium Carbonate-Vitamin D (CALCIUM PLUS VITAMIN D) 500-50 MG-UNIT CAPS Take 1 tablet by mouth every evening.     . chlorthalidone (HYGROTON) 25 MG tablet     . Cholecalciferol 1.25 MG (50000 UT) capsule Take by mouth.    . Cinnamon 500 MG TABS Take 500 mg by mouth every evening.     . clopidogrel (PLAVIX) 75 MG tablet Take 1 tablet (75 mg total) by mouth daily. Take along with aspirin 81 mg daily for 4 weeks then stop aspirin and continue Plavix indefinitely 30 tablet 11  . co-enzyme Q-10 30 MG capsule Take by mouth.    . Coenzyme Q10 200 MG capsule Take by mouth.    Mariane Baumgarten Sodium (DSS) 100 MG CAPS Take by mouth.    . gabapentin (NEURONTIN) 300 MG capsule Take 600 mg by mouth 2 (two) times daily.   0  . Ginger, Zingiber officinalis, (GINGER ROOT PO) Take 1 tablet by mouth every evening.    Marland Kitchen lisinopril (ZESTRIL) 10 MG tablet Take 1 tablet (10 mg total) by mouth daily. Take it at night 30 tablet 11  . loratadine (CLARITIN) 10 MG tablet Take 10 mg by mouth daily.    . Magnesium 250 MG TABS Take by mouth.    . meclizine (ANTIVERT) 12.5 MG tablet Take 1 tablet (12.5 mg total) by mouth 3 (three) times daily as needed for dizziness. 30 tablet 0  . metFORMIN (GLUCOPHAGE-XR) 500 MG 24 hr tablet Take 500 mg by mouth every evening.     . metoprolol succinate (TOPROL XL) 25 MG 24 hr tablet Take 1 tablet (25 mg total) by mouth daily. 30 tablet 11  . Misc Natural Products  (BLACK CHERRY CONCENTRATE PO) Take 1 tablet by mouth every evening.     . olmesartan (BENICAR) 40 MG tablet Take by mouth.    Marland Kitchen tiZANidine (ZANAFLEX) 4 MG tablet Take 4 mg by mouth at bedtime.    . TURMERIC PO Take 1 capsule by mouth every evening.     . vitamin B-12 (CYANOCOBALAMIN) 500 MCG tablet Take 1 tablet by mouth every morning.      No current facility-administered medications for this visit.    REVIEW OF SYSTEMS:   Review of Systems  Constitutional: Positive for fatigue (75%). Negative  for appetite change.  Neurological: Positive for numbness (toes).  Psychiatric/Behavioral: Positive for sleep disturbance.  All other systems reviewed and are negative.    PHYSICAL EXAMINATION: ECOG PERFORMANCE STATUS: 0 - Asymptomatic  Vitals:   04/23/20 1342  BP: (!) 163/74  Pulse: (!) 59  Resp: 20  Temp: (!) 96.9 F (36.1 C)  SpO2: 100%   Filed Weights   04/23/20 1342  Weight: 203 lb 8 oz (92.3 kg)   Physical Exam Vitals reviewed.  Constitutional:      Appearance: Normal appearance.  Cardiovascular:     Rate and Rhythm: Normal rate and regular rhythm.     Heart sounds: Normal heart sounds.  Pulmonary:     Effort: Pulmonary effort is normal.     Breath sounds: Normal breath sounds.  Abdominal:     General: There is no distension.     Palpations: Abdomen is soft. There is no mass.  Neurological:     General: No focal deficit present.     Mental Status: She is alert and oriented to person, place, and time.  Psychiatric:        Mood and Affect: Mood normal.        Behavior: Behavior normal.      LABORATORY DATA:  I have reviewed the data as listed No results found for this or any previous visit (from the past 2160 hour(s)).  RADIOGRAPHIC STUDIES: I have personally reviewed the radiological images as listed and agreed with the findings in the report. No results found.  ASSESSMENT:  1.  Bence-Jones proteinuria, kappa type: -Patient seen at the request of Dr.  Theador Hawthorne. -Urine immunofixation on 03/19/2020 shows Bence-Jones protein positive, kappa type.  SPEP was negative. -Free kappa light chains are 31.3, lambda light chains 23 with ratio of 1.36.  Creatinine 1.04 and calcium is 9.4.  2.  CKD: -Thought to be secondary to diabetes/hypertension. -Renal ultrasound on 03/04/2020 with mild focal cortical scarring at the upper pole of the left kidney with no hydronephrosis. -She has nonnephrotic range proteinuria.   PLAN:  1.  Bence-Jones proteinuria: -We reviewed findings on the labs. -We reviewed normal pathophysiology of plasma cell disorders in general and MGUS in particular. -Recommend completing work-up by checking serum immunofixation, LDH and beta-2 microglobulin. -Also recommend skeletal survey. -We will do phone follow-up in 2 weeks to discuss further surveillance plan.    All questions were answered. The patient knows to call the clinic with any problems, questions or concerns.   Derek Jack, MD 04/23/20 2:20 PM  [facility] [phone]   I, Milinda Antis, am acting as a scribe for Dr. Sanda Linger.  I, Derek Jack MD, have reviewed the above documentation for accuracy and completeness, and I agree with the above.

## 2020-04-24 ENCOUNTER — Other Ambulatory Visit: Payer: Self-pay

## 2020-04-24 ENCOUNTER — Inpatient Hospital Stay (HOSPITAL_COMMUNITY): Payer: Medicare HMO | Attending: Hematology

## 2020-04-24 ENCOUNTER — Ambulatory Visit (HOSPITAL_COMMUNITY)
Admission: RE | Admit: 2020-04-24 | Discharge: 2020-04-24 | Disposition: A | Discharge status: Home / Self Care | Payer: Medicare HMO | Source: Ambulatory Visit | Attending: Hematology | Admitting: Hematology

## 2020-04-24 DIAGNOSIS — R803 Bence Jones proteinuria: Secondary | ICD-10-CM | POA: Diagnosis not present

## 2020-04-24 DIAGNOSIS — N189 Chronic kidney disease, unspecified: Secondary | ICD-10-CM | POA: Insufficient documentation

## 2020-04-24 DIAGNOSIS — D472 Monoclonal gammopathy: Secondary | ICD-10-CM | POA: Insufficient documentation

## 2020-04-24 LAB — LACTATE DEHYDROGENASE: LDH: 179 U/L (ref 98–192)

## 2020-04-25 LAB — BETA 2 MICROGLOBULIN, SERUM: Beta-2 Microglobulin: 2.6 mg/L — ABNORMAL HIGH (ref 0.6–2.4)

## 2020-04-26 LAB — IMMUNOFIXATION ELECTROPHORESIS
IgA: 214 mg/dL (ref 64–422)
IgG (Immunoglobin G), Serum: 1052 mg/dL (ref 586–1602)
IgM (Immunoglobulin M), Srm: 27 mg/dL (ref 26–217)
Total Protein ELP: 6.9 g/dL (ref 6.0–8.5)

## 2020-05-09 ENCOUNTER — Other Ambulatory Visit: Payer: Self-pay

## 2020-05-09 ENCOUNTER — Inpatient Hospital Stay (HOSPITAL_COMMUNITY): Payer: Medicare HMO | Attending: Hematology | Admitting: Hematology

## 2020-05-09 DIAGNOSIS — D472 Monoclonal gammopathy: Secondary | ICD-10-CM

## 2020-05-09 DIAGNOSIS — R803 Bence Jones proteinuria: Secondary | ICD-10-CM | POA: Diagnosis not present

## 2020-05-09 NOTE — Progress Notes (Signed)
Virtual Visit via Telephone Note  I connected with Sabrina Taylor on 05/09/20 at  4:15 PM EDT by telephone and verified that I am speaking with the correct person using two identifiers.  Location: Patient: At home Provider: In the office   I discussed the limitations, risks, security and privacy concerns of performing an evaluation and management service by telephone and the availability of in person appointments. I also discussed with the patient that there may be a patient responsible charge related to this service. The patient expressed understanding and agreed to proceed.   History of Present Illness: This patient was evaluated at the request of Dr. Theador Hawthorne for positive Bence-Jones proteinuria on urine studies from 03/19/2020.   Observations/Objective: She denies any fevers, night sweats or weight loss.  No new onset pains.  Appetite is 100%.  Energy levels are 75%.  Has minor sleep problems which are stable.   Assessment and Plan:  1.  Bence-Jones proteinuria: -Urine immunofixation on 03/19/2020 shows Bence-Jones proteinuria, kappa type. -SPEP was negative.  We discussed results of immunofixation which was negative.  Light chain ratio is 1.36.  Beta-2 microglobulin 2.6 and LDH 179. -Skeletal survey on 04/24/2020 did not reveal any lytic lesions. -Recommend follow-up in 6 months with repeat myeloma labs. -She has follow-up with Dr. Theador Hawthorne later this month for 24-hour urine.  We will follow up on the results.   Follow Up Instructions: RTC 6 months with labs.   I discussed the assessment and treatment plan with the patient. The patient was provided an opportunity to ask questions and all were answered. The patient agreed with the plan and demonstrated an understanding of the instructions.   The patient was advised to call back or seek an in-person evaluation if the symptoms worsen or if the condition fails to improve as anticipated.  I provided 11 minutes of non-face-to-face time  during this encounter.   Derek Jack, MD

## 2020-05-23 DIAGNOSIS — I10 Essential (primary) hypertension: Secondary | ICD-10-CM | POA: Diagnosis not present

## 2020-05-23 DIAGNOSIS — E7849 Other hyperlipidemia: Secondary | ICD-10-CM | POA: Diagnosis not present

## 2020-06-23 DIAGNOSIS — I10 Essential (primary) hypertension: Secondary | ICD-10-CM | POA: Diagnosis not present

## 2020-06-23 DIAGNOSIS — E7849 Other hyperlipidemia: Secondary | ICD-10-CM | POA: Diagnosis not present

## 2020-07-24 DIAGNOSIS — E7849 Other hyperlipidemia: Secondary | ICD-10-CM | POA: Diagnosis not present

## 2020-07-24 DIAGNOSIS — I10 Essential (primary) hypertension: Secondary | ICD-10-CM | POA: Diagnosis not present

## 2020-08-08 ENCOUNTER — Other Ambulatory Visit: Payer: Self-pay

## 2020-08-08 ENCOUNTER — Ambulatory Visit
Admission: RE | Admit: 2020-08-08 | Discharge: 2020-08-08 | Disposition: A | Discharge status: Home / Self Care | Payer: Medicare HMO | Source: Ambulatory Visit | Attending: Family Medicine | Admitting: Family Medicine

## 2020-08-08 VITALS — BP 134/83 | HR 61 | Temp 98.4°F | Resp 18

## 2020-08-08 DIAGNOSIS — J069 Acute upper respiratory infection, unspecified: Secondary | ICD-10-CM | POA: Diagnosis not present

## 2020-08-08 NOTE — ED Triage Notes (Signed)
Pt also reports left leg weakness

## 2020-08-08 NOTE — ED Triage Notes (Signed)
Pt presents with c/o nasal congestion and feeling unwell, pt took home covid test and possible possitive

## 2020-08-08 NOTE — Discharge Instructions (Addendum)
You have been tested for COVID-19 today. °If your test returns positive, you will receive a phone call from [facility] regarding your results. °Negative test results are not called. °Both positive and negative results area always visible on MyChart. °If you do not have a MyChart account, sign up instructions are provided in your discharge papers. °Please do not hesitate to contact us should you have questions or concerns. ° °

## 2020-08-09 LAB — SARS-COV-2, NAA 2 DAY TAT

## 2020-08-09 LAB — NOVEL CORONAVIRUS, NAA: SARS-CoV-2, NAA: NOT DETECTED

## 2020-08-11 NOTE — ED Provider Notes (Signed)
[facility]   [id number] 08/08/20 Arrival Time: 9242  ASSESSMENT & PLAN:  1. Viral URI    Discussed typical duration of viral illnesses. COVID-19 testing sent. OTC symptom care as needed.   Follow-up Information     [name].   Specialty: Family Medicine Why: As needed. Contact information: [place] [address] [phone]                 Reviewed expectations re: course of current medical issues. Questions answered. Outlined signs and symptoms indicating need for more acute intervention. Understanding verbalized. After Visit Summary given.   SUBJECTIVE: History from: patient. Sabrina Taylor is a 75 y.o. female who presents with worries regarding COVID-19. Known COVID-19 contact: none. Recent travel: none. Reports: nasal congestion; feeling unwell. Denies: fever and difficulty breathing. Normal PO intake without n/v/d. Home COVID test +.  OBJECTIVE:  Vitals:   08/08/20 1422  BP: 134/83  Pulse: 61  Resp: 18  Temp: 98.4 F (36.9 C)  SpO2: 95%    General appearance: alert; no distress Eyes: PERRLA; EOMI; conjunctiva normal HENT: [place]; AT; with nasal congestion Neck: supple  Lungs: speaks full sentences without difficulty; unlabored Extremities: no edema Skin: warm and dry Neurologic: normal gait Psychological: alert and cooperative; normal mood and affect  Labs: Results for orders placed or performed during the hospital encounter of 08/08/20  Novel Coronavirus, NAA ([facility])   Specimen: Nasopharyngeal Swab; Nasopharyngeal(NP) swabs in vial transport medium   Nasopharynge  Result Value Ref Range   SARS-CoV-2, NAA Not Detected Not Detected  SARS-COV-2, NAA 2 DAY TAT   Nasopharynge  Result Value Ref Range   SARS-CoV-2, NAA 2 DAY TAT Performed    Labs Reviewed  NOVEL CORONAVIRUS, NAA   Narrative:    Performed at:  [facility] [address] [place], [place]  [id number] Lab  Director: Rush Farmer MD, Phone:  [phone]  SARS-COV-2, NAA 2 DAY TAT   Narrative:    Performed at:  [facility] [address], [place], [place]  [id number] Lab Director: Rush Farmer MD, Phone:  [phone]    Imaging: No results found.  Allergies  Allergen Reactions   Penicillins Hives    Did it involve swelling of the face/tongue/throat, SOB, or low BP? No Did it involve sudden or severe rash/hives, skin peeling, or any reaction on the inside of your mouth or nose? Yes-Hives Did you need to seek medical attention at a hospital or doctor's office? Yes When did it last happen? 75 years old    If all above answers are "NO", may proceed with cephalosporin use.     Past Medical History:  Diagnosis Date   DDD (degenerative disc disease)    Low back pain   Diabetes mellitus without complication ([facility])    First degree AV block    Hyperlipidemia    Hypertension    LBBB (left bundle branch block)    Obesity    PAF (paroxysmal atrial fibrillation) ([facility])    PONV (postoperative nausea and vomiting)    Stroke ([facility]) 07/2018   Vertigo following cerebrovascular accident 07/2018   Social History   Socioeconomic History   Marital status: Married    Spouse name: Not on file   Number of children: Not on file   Years of education: Not on file   Highest education level: Not on file  Occupational History   Occupation: Agricultural engineer, 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  Tobacco Use   Smoking status: Never   Smokeless tobacco: Never  Substance and Sexual Activity   Alcohol use: No   Drug use: No   Sexual activity: Not on file  Other Topics Concern   Not on file  Social History Narrative   Married with one adult child, lives in [place]   No regular exercise   Social Determinants of Health   Financial Resource Strain: Not on file  Food Insecurity: Not on file  Transportation Needs: No Transportation Needs   Lack of Transportation (Medical): No    Lack of Transportation (Non-Medical): No  Physical Activity: Inactive   Days of Exercise per Week: 0 days   Minutes of Exercise per Session: 0 min  Stress: Not on file  Social Connections: Unknown   Frequency of Communication with Friends and Family: More than three times a week   Frequency of Social Gatherings with Friends and Family: Three times a week   Attends Religious Services: Not on file   Active Member of Clubs or Organizations: No   Attends Archivist Meetings: Never   Marital Status: Married  Human resources officer Violence: Not At Risk   Fear of Current or Ex-Partner: No   Emotionally Abused: No   Physically Abused: No   Sexually Abused: No   Family History  Problem Relation Age of Onset   Hypertension Mother    Alzheimer's disease Mother    Stroke Father    Coronary artery disease Brother    Past Surgical History:  Procedure Laterality Date   APPENDECTOMY  1960   BACK SURGERY  may 2011   CATARACT EXTRACTION W/PHACO Right 06/05/2014   Procedure: CATARACT EXTRACTION PHACO AND INTRAOCULAR LENS PLACEMENT ([facility]);  Surgeon: Tonny Branch, MD;  Location: [facility];  Service: Ophthalmology;  Laterality: Right;  CDE: 6.42   CATARACT EXTRACTION W/PHACO Left 06/19/2014   Procedure: CATARACT EXTRACTION PHACO AND INTRAOCULAR LENS PLACEMENT (IOC);  Surgeon: Tonny Branch, MD;  Location: [facility];  Service: Ophthalmology;  Laterality: Left;  CDE 5.61   CHOLECYSTECTOMY     COLONOSCOPY  03/25/2011   Procedure: COLONOSCOPY;  Surgeon: Jamesetta So, MD;  Location: [facility];  Service: Gastroenterology;  Laterality: N/A;   KNEE ARTHROSCOPY  2004, 2008   Right   TOTAL ABDOMINAL HYSTERECTOMY       Vanessa Kick, MD 08/11/20 1010

## 2020-08-12 DIAGNOSIS — R059 Cough, unspecified: Secondary | ICD-10-CM | POA: Diagnosis not present

## 2020-08-12 DIAGNOSIS — Z9189 Other specified personal risk factors, not elsewhere classified: Secondary | ICD-10-CM | POA: Diagnosis not present

## 2020-08-12 DIAGNOSIS — U071 COVID-19: Secondary | ICD-10-CM | POA: Diagnosis not present

## 2020-08-23 DIAGNOSIS — I10 Essential (primary) hypertension: Secondary | ICD-10-CM | POA: Diagnosis not present

## 2020-08-23 DIAGNOSIS — E782 Mixed hyperlipidemia: Secondary | ICD-10-CM | POA: Diagnosis not present

## 2020-09-23 DIAGNOSIS — I10 Essential (primary) hypertension: Secondary | ICD-10-CM | POA: Diagnosis not present

## 2020-09-23 DIAGNOSIS — E7849 Other hyperlipidemia: Secondary | ICD-10-CM | POA: Diagnosis not present

## 2020-10-24 DIAGNOSIS — I10 Essential (primary) hypertension: Secondary | ICD-10-CM | POA: Diagnosis not present

## 2020-10-24 DIAGNOSIS — E7849 Other hyperlipidemia: Secondary | ICD-10-CM | POA: Diagnosis not present

## 2020-12-04 ENCOUNTER — Encounter: Payer: Self-pay | Admitting: Emergency Medicine

## 2020-12-04 ENCOUNTER — Ambulatory Visit: Payer: Self-pay

## 2020-12-04 ENCOUNTER — Other Ambulatory Visit: Payer: Self-pay

## 2020-12-04 ENCOUNTER — Ambulatory Visit
Admission: EM | Admit: 2020-12-04 | Discharge: 2020-12-04 | Disposition: A | Discharge status: Home / Self Care | Payer: Medicare HMO | Attending: Emergency Medicine | Admitting: Emergency Medicine

## 2020-12-04 DIAGNOSIS — R21 Rash and other nonspecific skin eruption: Secondary | ICD-10-CM | POA: Diagnosis not present

## 2020-12-04 MED ORDER — CEPHALEXIN 500 MG PO CAPS: 1000.0000 mg | ORAL_CAPSULE | Freq: Two times a day (BID) | ORAL | 0 refills | Status: AC

## 2020-12-04 MED ORDER — TRIAMCINOLONE ACETONIDE 0.1 % EX CREA: 1.0000 "application " | TOPICAL_CREAM | Freq: Two times a day (BID) | CUTANEOUS | 0 refills | Status: AC

## 2020-12-04 NOTE — ED Provider Notes (Signed)
HPI  SUBJECTIVE:  Sabrina Taylor is a 75 y.o. female who presents with a pruritic, "stinging" erythematous rash on her right lower extremity that has now wrapped around her calf.  She reports increased temperature.  No drainage, crusting, blisters, fevers, body aches, lower extremity swelling, known trauma to the area, known insect bite, shaving, new lotions, soaps, detergents, exposure to poison ivy or poison oak.  She has tried [employer] and Benadryl nightly with improvement in her symptoms.  No aggravating factors.  She has a past medical history of CVA on Plavix, prediabetes, hypertension, atrial fibrillation.  No history of MRSA.  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    Past Surgical History:  Procedure Laterality Date   APPENDECTOMY  1960   BACK SURGERY  may 2011   CATARACT EXTRACTION W/PHACO Right 06/05/2014   Procedure: CATARACT EXTRACTION PHACO AND INTRAOCULAR LENS PLACEMENT ([place]);  Surgeon: Tonny Branch, MD;  Location: [facility];  Service: Ophthalmology;  Laterality: Right;  CDE: 6.42   CATARACT EXTRACTION W/PHACO Left 06/19/2014   Procedure: CATARACT EXTRACTION PHACO AND INTRAOCULAR LENS PLACEMENT ([facility]);  Surgeon: Tonny Branch, MD;  Location: [facility];  Service: Ophthalmology;  Laterality: Left;  CDE 5.61   CHOLECYSTECTOMY     COLONOSCOPY  03/25/2011   Procedure: COLONOSCOPY;  Surgeon: Jamesetta So, MD;  Location: [facility] END[facility] SUITE;  Service: Gastroenterology;  Laterality: N/A;   KNEE ARTHROSCOPY  2004, 2008   Right   TOTAL ABDOMINAL HYSTERECTOMY      Family History  Problem  Relation Age of Onset   Hypertension Mother    Alzheimer's disease Mother    Stroke Father    Coronary artery disease Brother     Social History   Tobacco Use   Smoking status: Never   Smokeless tobacco: Never  Vaping Use   Vaping Use: Never used  Substance Use Topics   Alcohol use: No   Drug use: No    No current facility-administered medications for this encounter.  Current Outpatient Medications:    amLODipine (NORVASC) 5 MG tablet, Take by mouth., Disp: , Rfl:    atorvastatin (LIPITOR) 40 MG tablet, Take 1 tablet (40 mg total) by mouth daily., Disp: 30 tablet, Rfl: 1   Biotin 1000 MCG tablet, Take by mouth., Disp: , Rfl:    Calcium Carbonate-Vitamin D 500-50 MG-UNIT CAPS, Take 1 tablet by mouth every evening., Disp: , Rfl:    cephALEXin (KEFLEX) 500 MG capsule, Take 2 capsules (1,000 mg total) by mouth 2 (two) times daily for 5 days., Disp: 20 capsule, Rfl: 0   chlorthalidone (HYGROTON) 25 MG tablet, , Disp: , Rfl:    Cholecalciferol 1.25 MG (50000 UT) capsule, Take by mouth., Disp: , Rfl:    Cinnamon 500 MG TABS, Take 500 mg by mouth every evening. , Disp: , Rfl:    clopidogrel (PLAVIX) 75 MG tablet, Take 1 tablet (75 mg total) by mouth daily. Take along with aspirin 81 mg daily for 4 weeks then stop aspirin and continue Plavix indefinitely, Disp:  30 tablet, Rfl: 11   co-enzyme Q-10 30 MG capsule, Take by mouth., Disp: , Rfl:    Coenzyme Q10 200 MG capsule, Take by mouth., Disp: , Rfl:    gabapentin (NEURONTIN) 300 MG capsule, Take 600 mg by mouth 2 (two) times daily. , Disp: , Rfl: 0   Ginger, Zingiber officinalis, (GINGER ROOT PO), Take 1 tablet by mouth every evening., Disp: , Rfl:    loratadine (CLARITIN) 10 MG tablet, Take 10 mg by mouth daily., Disp: , Rfl:    Magnesium 250 MG TABS, Take by mouth., Disp: , Rfl:    metFORMIN (GLUCOPHAGE-XR) 500 MG 24 hr tablet, Take 500 mg by mouth every evening. , Disp: , Rfl:    metoprolol succinate (TOPROL XL) 25 MG 24 hr tablet,  Take 1 tablet (25 mg total) by mouth daily., Disp: 30 tablet, Rfl: 11   olmesartan (BENICAR) 40 MG tablet, Take by mouth., Disp: , Rfl:    tiZANidine (ZANAFLEX) 4 MG tablet, Take 4 mg by mouth at bedtime., Disp: , Rfl:    triamcinolone cream (KENALOG) 0.1 %, Apply 1 application topically 2 (two) times daily. Apply for 2 weeks. May use on face, Disp: 30 g, Rfl: 0   TURMERIC PO, Take 1 capsule by mouth every evening. , Disp: , Rfl:    vitamin B-12 (CYANOCOBALAMIN) 500 MCG tablet, Take 1 tablet by mouth every morning. , Disp: , Rfl:    aspirin 81 MG EC tablet, Take 1 tablet (81 mg total) by mouth daily with breakfast. Along with Plavix for 4 weeks then stop aspirin and take Plavix alone after 4 weeks, Disp: 30 tablet, Rfl: 0   Docusate Sodium (DSS) 100 MG CAPS, Take by mouth., Disp: , Rfl:    lisinopril (ZESTRIL) 10 MG tablet, Take 1 tablet (10 mg total) by mouth daily. Take it at night, Disp: 30 tablet, Rfl: 11   meclizine (ANTIVERT) 12.5 MG tablet, Take 1 tablet (12.5 mg total) by mouth 3 (three) times daily as needed for dizziness., Disp: 30 tablet, Rfl: 0   Misc Natural Products (BLACK CHERRY CONCENTRATE PO), Take 1 tablet by mouth every evening. , Disp: , Rfl:   Allergies  Allergen Reactions   Penicillins Hives    Did it involve swelling of the face/tongue/throat, SOB, or low BP? No Did it involve sudden or severe rash/hives, skin peeling, or any reaction on the inside of your mouth or nose? Yes-Hives Did you need to seek medical attention at a hospital or doctor's office? Yes When did it last happen? 75 years old    If all above answers are "NO", may proceed with cephalosporin use.      ROS  As noted in HPI.   Physical Exam  BP (!) 164/77 (BP Location: Left Arm)   Pulse (!) 58   Temp 97.9 F (36.6 C) (Oral)   Resp 18   SpO2 96%    Constitutional: Well developed, well nourished, no acute distress Eyes:  EOMI, conjunctiva normal bilaterally HENT: Normocephalic,  atraumatic,mucus membranes moist Respiratory: Normal inspiratory effort Cardiovascular: Normal rate GI: nondistended skin: Nontender, blanchable, flat blotchy erythematous rash on right lower extremity wrapping to posterior calf.  No vesicles, crusting.       Musculoskeletal: no deformities Neurologic: Alert & oriented x 3, no focal neuro deficits Psychiatric: Speech and behavior appropriate   ED Course   Medications - No data to display  No orders of the defined types were placed in this encounter.   No  results found for this or any previous visit (from the past 24 hour(s)). No results found.  ED Clinical Impression  1. Rash and nonspecific skin eruption      ED Assessment/Plan  Does not appear to be shingles.  Suspect vasculitis versus cellulitis.  Will send home with triamcinolone cream, discontinue Benadryl, Claritin or Zyrtec, cool compresses, Keflex 1000 g p.o. twice daily for 5 days.  She states that she took penicillin 50 years ago, had a rash, denies anaphylaxis, and is okay with trying the Keflex.  States that she thinks that she has had it before without any issues.  Follow-up with PMD as needed, ER return precautions given  Discussed lMDM, treatment plan, and plan for follow-up with patient. Discussed sn/sx that should prompt return to the ED. patient agrees with plan.   Meds ordered this encounter  Medications   cephALEXin (KEFLEX) 500 MG capsule    Sig: Take 2 capsules (1,000 mg total) by mouth 2 (two) times daily for 5 days.    Dispense:  20 capsule    Refill:  0   triamcinolone cream (KENALOG) 0.1 %    Sig: Apply 1 application topically 2 (two) times daily. Apply for 2 weeks. May use on face    Dispense:  30 g    Refill:  0      *This clinic note was created using [employer]. Therefore, there may be occasional mistakes despite careful proofreading.  ?    Melynda Ripple, MD 12/05/20 [phone]

## 2020-12-04 NOTE — Discharge Instructions (Signed)
triamcinolone cream for itching and inflammation, discontinue Benadryl, Claritin or Zyrtec, cool compresses, Keflex 1000 g p.o. twice daily for 5 days.  Follow-up with your doctor or here if it is not improving or getting worse.

## 2020-12-04 NOTE — ED Triage Notes (Signed)
Pt c/o rash right lower shin x 1 week. The rash is itchy and it stings.

## 2020-12-26 DIAGNOSIS — I10 Essential (primary) hypertension: Secondary | ICD-10-CM | POA: Diagnosis not present

## 2020-12-26 DIAGNOSIS — E7800 Pure hypercholesterolemia, unspecified: Secondary | ICD-10-CM | POA: Diagnosis not present

## 2020-12-26 DIAGNOSIS — H5200 Hypermetropia, unspecified eye: Secondary | ICD-10-CM | POA: Diagnosis not present

## 2020-12-26 DIAGNOSIS — E109 Type 1 diabetes mellitus without complications: Secondary | ICD-10-CM | POA: Diagnosis not present

## 2020-12-29 DIAGNOSIS — J111 Influenza due to unidentified influenza virus with other respiratory manifestations: Secondary | ICD-10-CM | POA: Diagnosis not present

## 2021-01-23 DIAGNOSIS — I10 Essential (primary) hypertension: Secondary | ICD-10-CM | POA: Diagnosis not present

## 2021-01-23 DIAGNOSIS — E782 Mixed hyperlipidemia: Secondary | ICD-10-CM | POA: Diagnosis not present

## 2021-02-20 DIAGNOSIS — E669 Obesity, unspecified: Secondary | ICD-10-CM | POA: Diagnosis not present

## 2021-02-20 DIAGNOSIS — H6690 Otitis media, unspecified, unspecified ear: Secondary | ICD-10-CM | POA: Diagnosis not present

## 2021-02-20 DIAGNOSIS — Z6829 Body mass index (BMI) 29.0-29.9, adult: Secondary | ICD-10-CM | POA: Diagnosis not present

## 2021-02-20 DIAGNOSIS — E119 Type 2 diabetes mellitus without complications: Secondary | ICD-10-CM | POA: Diagnosis not present

## 2021-02-22 DIAGNOSIS — I10 Essential (primary) hypertension: Secondary | ICD-10-CM | POA: Diagnosis not present

## 2021-02-22 DIAGNOSIS — E782 Mixed hyperlipidemia: Secondary | ICD-10-CM | POA: Diagnosis not present

## 2021-02-25 DIAGNOSIS — H66002 Acute suppurative otitis media without spontaneous rupture of ear drum, left ear: Secondary | ICD-10-CM | POA: Diagnosis not present

## 2021-03-17 DIAGNOSIS — Z20822 Contact with and (suspected) exposure to covid-19: Secondary | ICD-10-CM | POA: Diagnosis not present

## 2021-03-17 DIAGNOSIS — R059 Cough, unspecified: Secondary | ICD-10-CM | POA: Diagnosis not present

## 2021-03-17 DIAGNOSIS — M791 Myalgia, unspecified site: Secondary | ICD-10-CM | POA: Diagnosis not present

## 2021-03-17 DIAGNOSIS — J209 Acute bronchitis, unspecified: Secondary | ICD-10-CM | POA: Diagnosis not present

## 2021-04-23 DIAGNOSIS — E782 Mixed hyperlipidemia: Secondary | ICD-10-CM | POA: Diagnosis not present

## 2021-04-23 DIAGNOSIS — I10 Essential (primary) hypertension: Secondary | ICD-10-CM | POA: Diagnosis not present

## 2021-05-06 DIAGNOSIS — E119 Type 2 diabetes mellitus without complications: Secondary | ICD-10-CM | POA: Diagnosis not present

## 2021-05-14 ENCOUNTER — Other Ambulatory Visit (HOSPITAL_COMMUNITY): Payer: Self-pay | Admitting: *Deleted

## 2021-05-27 DIAGNOSIS — J069 Acute upper respiratory infection, unspecified: Secondary | ICD-10-CM | POA: Diagnosis not present

## 2021-05-27 DIAGNOSIS — B9689 Other specified bacterial agents as the cause of diseases classified elsewhere: Secondary | ICD-10-CM | POA: Diagnosis not present

## 2021-05-27 DIAGNOSIS — R051 Acute cough: Secondary | ICD-10-CM | POA: Diagnosis not present

## 2021-05-27 IMAGING — DX DG BONE SURVEY MET
8 of 10 series · 8 of 10 positions shown · non-contrast
Comparison: None.

CLINICAL DATA: MGUS

EXAM:
METASTATIC BONE SURVEY

[skull lat]
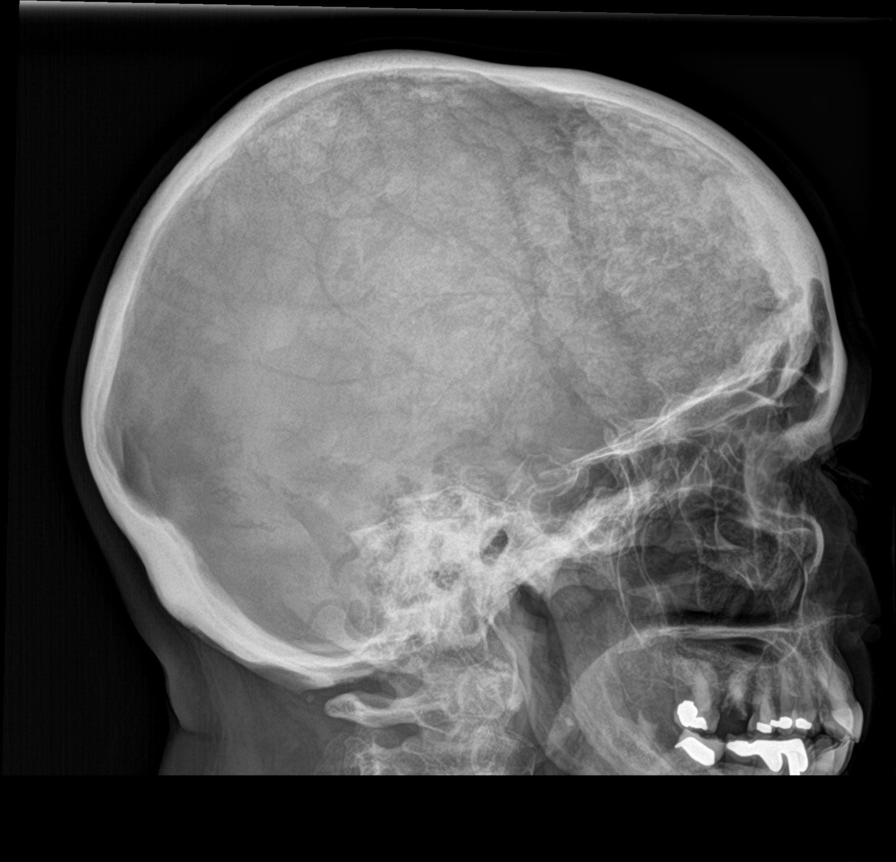

[shoulder ap (1 of 2)]
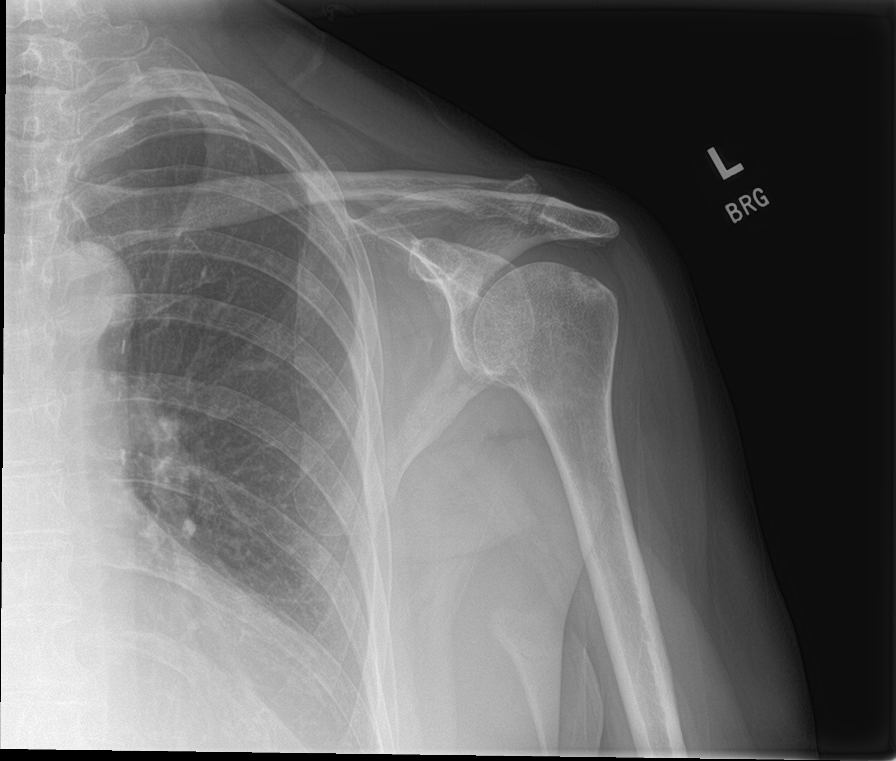

[shoulder ap (2 of 2)]
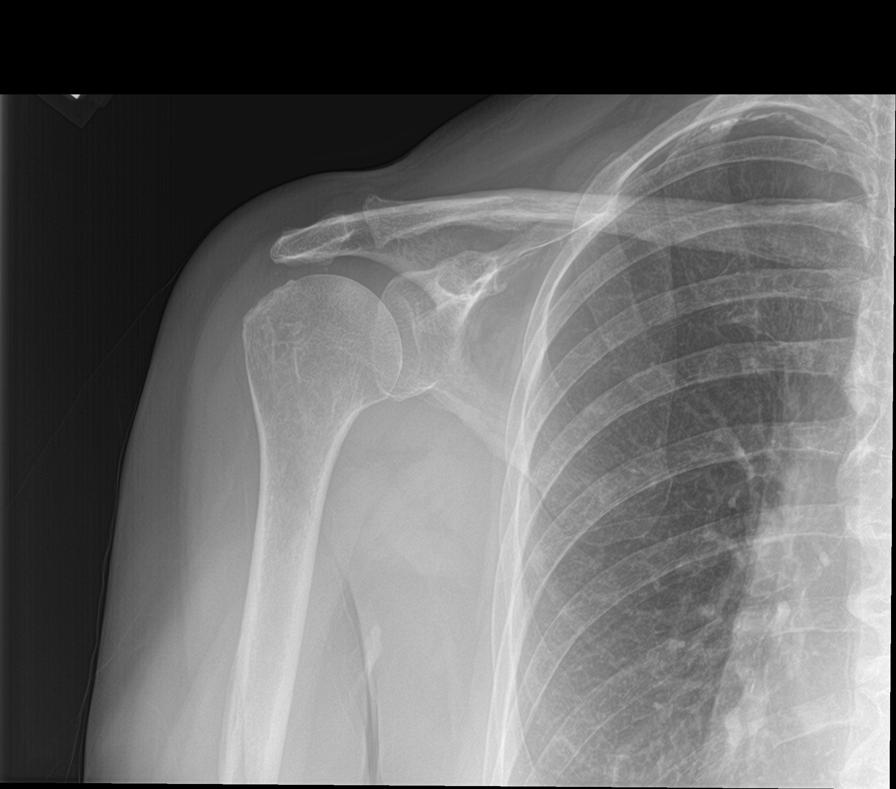

[humerus ap (1 of 2)]
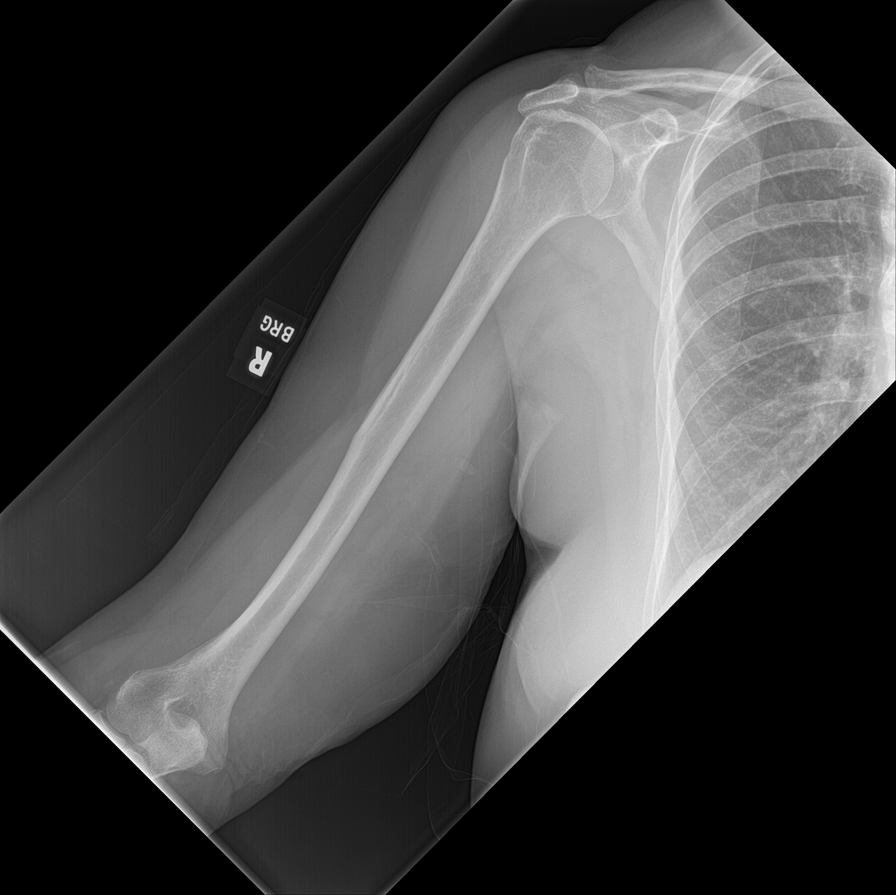

[humerus ap (2 of 2)]
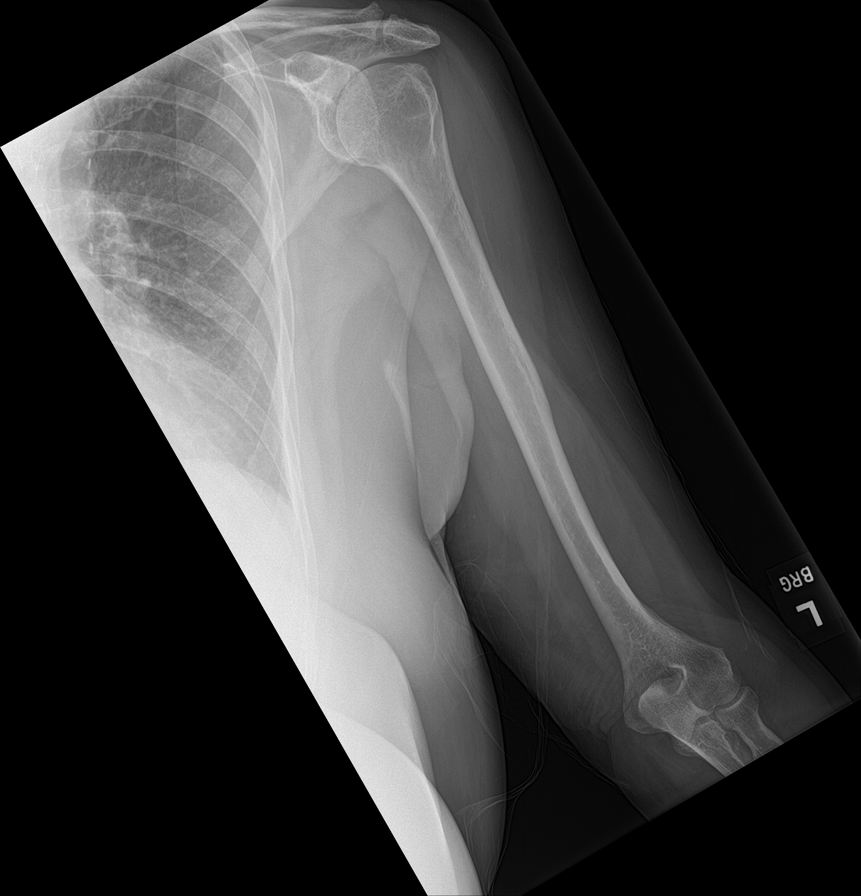

[forearm ap (1 of 2)]
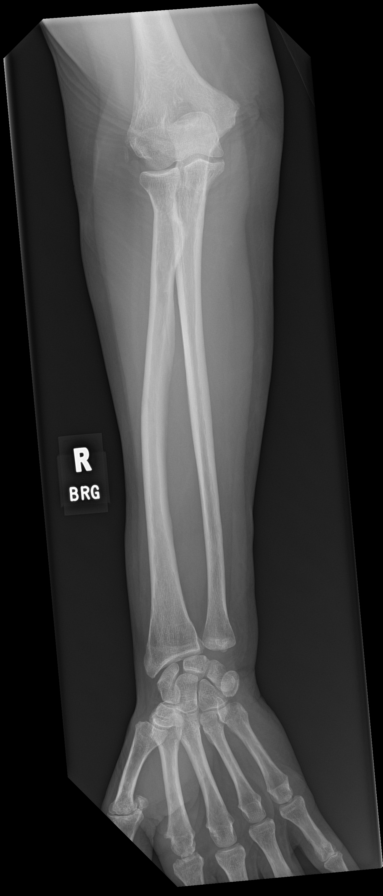

[forearm ap (2 of 2)]
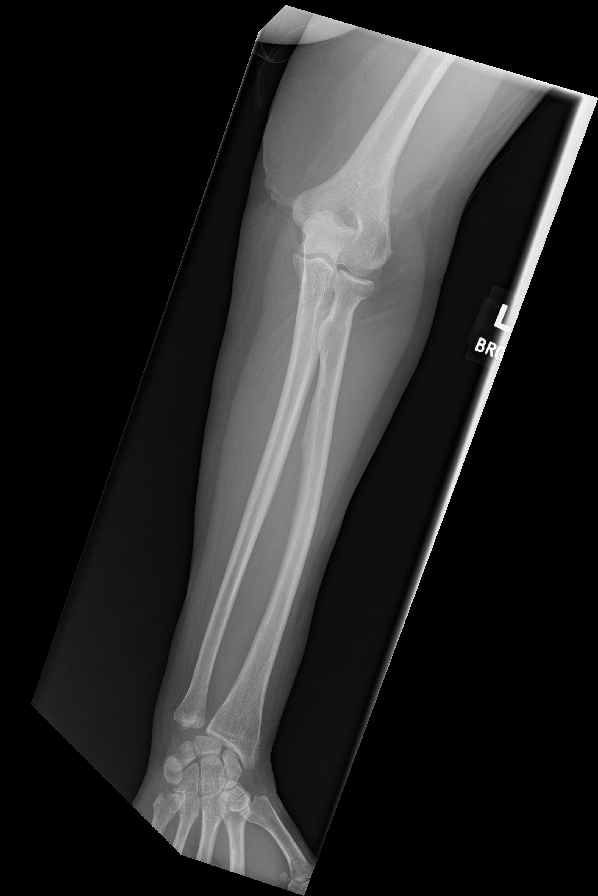

[c-spine ap]
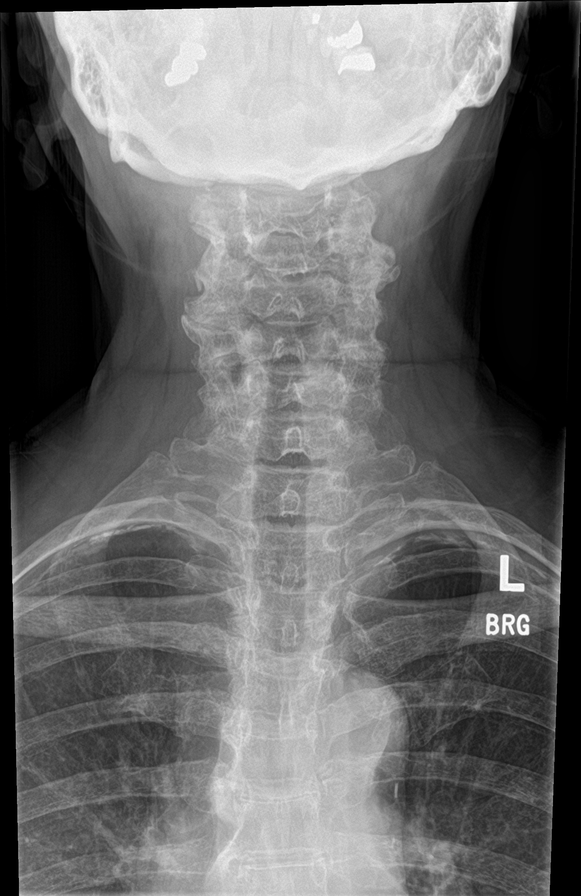

[8 of 10 positions shown; findings below may reference images not displayed]

FINDINGS: No acute bony abnormality.  No focal suspicious bone lesion.

Diffuse degenerative changes throughout the facet joints in the
cervical spine, advanced. Mild degenerative disc disease in the
lower cervical spine, throughout the thoracic spine and lumbar
spine. Degenerative facet disease in the lower lumbar spine.

Heart is normal size.  Lungs clear.  No effusions.

Scattered aortic atherosclerosis. No visible aneurysm. Prior
cholecystectomy.
IMPRESSION: No acute bony abnormality or focal suspicious lytic lesion.

No acute cardiopulmonary disease.

Degenerative changes throughout the spine as above.

## 2021-06-02 DIAGNOSIS — J09X2 Influenza due to identified novel influenza A virus with other respiratory manifestations: Secondary | ICD-10-CM | POA: Diagnosis not present

## 2021-06-02 DIAGNOSIS — R531 Weakness: Secondary | ICD-10-CM | POA: Diagnosis not present

## 2021-06-02 DIAGNOSIS — R55 Syncope and collapse: Secondary | ICD-10-CM | POA: Diagnosis not present

## 2021-06-02 DIAGNOSIS — R059 Cough, unspecified: Secondary | ICD-10-CM | POA: Diagnosis not present

## 2021-06-02 DIAGNOSIS — I447 Left bundle-branch block, unspecified: Secondary | ICD-10-CM | POA: Diagnosis not present

## 2021-06-02 DIAGNOSIS — Z20822 Contact with and (suspected) exposure to covid-19: Secondary | ICD-10-CM | POA: Diagnosis not present

## 2021-06-02 DIAGNOSIS — Z880 Allergy status to penicillin: Secondary | ICD-10-CM | POA: Diagnosis not present

## 2021-06-02 DIAGNOSIS — R42 Dizziness and giddiness: Secondary | ICD-10-CM | POA: Diagnosis not present

## 2021-06-02 DIAGNOSIS — R569 Unspecified convulsions: Secondary | ICD-10-CM | POA: Diagnosis not present

## 2021-06-02 DIAGNOSIS — J101 Influenza due to other identified influenza virus with other respiratory manifestations: Secondary | ICD-10-CM | POA: Diagnosis not present

## 2021-06-29 DIAGNOSIS — Z20822 Contact with and (suspected) exposure to covid-19: Secondary | ICD-10-CM | POA: Diagnosis not present

## 2021-06-29 DIAGNOSIS — R051 Acute cough: Secondary | ICD-10-CM | POA: Diagnosis not present

## 2021-07-10 DIAGNOSIS — E782 Mixed hyperlipidemia: Secondary | ICD-10-CM | POA: Diagnosis not present

## 2021-07-10 DIAGNOSIS — E119 Type 2 diabetes mellitus without complications: Secondary | ICD-10-CM | POA: Diagnosis not present

## 2021-07-10 DIAGNOSIS — G629 Polyneuropathy, unspecified: Secondary | ICD-10-CM | POA: Diagnosis not present

## 2021-07-10 DIAGNOSIS — G8929 Other chronic pain: Secondary | ICD-10-CM | POA: Diagnosis not present

## 2021-07-10 DIAGNOSIS — Z6829 Body mass index (BMI) 29.0-29.9, adult: Secondary | ICD-10-CM | POA: Diagnosis not present

## 2021-07-10 DIAGNOSIS — I639 Cerebral infarction, unspecified: Secondary | ICD-10-CM | POA: Diagnosis not present

## 2021-07-10 DIAGNOSIS — Z1331 Encounter for screening for depression: Secondary | ICD-10-CM | POA: Diagnosis not present

## 2021-07-10 DIAGNOSIS — Z0001 Encounter for general adult medical examination with abnormal findings: Secondary | ICD-10-CM | POA: Diagnosis not present

## 2021-07-10 DIAGNOSIS — E7849 Other hyperlipidemia: Secondary | ICD-10-CM | POA: Diagnosis not present

## 2021-07-10 DIAGNOSIS — I10 Essential (primary) hypertension: Secondary | ICD-10-CM | POA: Diagnosis not present

## 2021-07-24 DIAGNOSIS — E782 Mixed hyperlipidemia: Secondary | ICD-10-CM | POA: Diagnosis not present

## 2021-07-24 DIAGNOSIS — I10 Essential (primary) hypertension: Secondary | ICD-10-CM | POA: Diagnosis not present

## 2021-10-24 DIAGNOSIS — R059 Cough, unspecified: Secondary | ICD-10-CM | POA: Diagnosis not present

## 2021-10-24 DIAGNOSIS — J069 Acute upper respiratory infection, unspecified: Secondary | ICD-10-CM | POA: Diagnosis not present

## 2021-12-23 ENCOUNTER — Ambulatory Visit: Payer: Medicare HMO | Admitting: Podiatry

## 2021-12-23 ENCOUNTER — Ambulatory Visit (INDEPENDENT_AMBULATORY_CARE_PROVIDER_SITE_OTHER): Payer: Medicare HMO

## 2021-12-23 DIAGNOSIS — M216X1 Other acquired deformities of right foot: Secondary | ICD-10-CM | POA: Diagnosis not present

## 2021-12-23 DIAGNOSIS — M216X2 Other acquired deformities of left foot: Secondary | ICD-10-CM

## 2021-12-23 DIAGNOSIS — Z7901 Long term (current) use of anticoagulants: Secondary | ICD-10-CM

## 2021-12-23 DIAGNOSIS — L84 Corns and callosities: Secondary | ICD-10-CM

## 2021-12-23 DIAGNOSIS — M21619 Bunion of unspecified foot: Secondary | ICD-10-CM | POA: Diagnosis not present

## 2021-12-23 NOTE — Progress Notes (Unsigned)
Subjective:   Patient ID: Sabrina Taylor, female   DOB: 76 y.o.   MRN: [id number]   HPI Chief Complaint  Patient presents with   Callouses    Right foot callus started 3 years ago, rate of pain 8 out of 10, X-Rays taken today     No treatment. No selling, redness, drainge. Hurts to walk.   On plavix  She was on metformin for diabeties but her surar has improved so she was taken off it.    ROS      Objective:  Physical Exam  *** Large sub 4    Assessment:  ***     Plan:  ***    orthotic

## 2021-12-26 ENCOUNTER — Other Ambulatory Visit: Payer: Self-pay | Admitting: Podiatry

## 2021-12-26 DIAGNOSIS — M216X2 Other acquired deformities of left foot: Secondary | ICD-10-CM

## 2021-12-26 DIAGNOSIS — L84 Corns and callosities: Secondary | ICD-10-CM

## 2021-12-26 DIAGNOSIS — M216X1 Other acquired deformities of right foot: Secondary | ICD-10-CM

## 2021-12-26 DIAGNOSIS — Z7901 Long term (current) use of anticoagulants: Secondary | ICD-10-CM

## 2021-12-26 DIAGNOSIS — M21619 Bunion of unspecified foot: Secondary | ICD-10-CM

## 2022-01-04 DIAGNOSIS — S5001XA Contusion of right elbow, initial encounter: Secondary | ICD-10-CM | POA: Diagnosis not present

## 2022-01-04 DIAGNOSIS — S59901A Unspecified injury of right elbow, initial encounter: Secondary | ICD-10-CM | POA: Diagnosis not present

## 2022-01-04 DIAGNOSIS — M109 Gout, unspecified: Secondary | ICD-10-CM | POA: Diagnosis not present

## 2022-01-04 DIAGNOSIS — Z7902 Long term (current) use of antithrombotics/antiplatelets: Secondary | ICD-10-CM | POA: Diagnosis not present

## 2022-01-04 DIAGNOSIS — S6991XA Unspecified injury of right wrist, hand and finger(s), initial encounter: Secondary | ICD-10-CM | POA: Diagnosis not present

## 2022-01-04 DIAGNOSIS — S50311A Abrasion of right elbow, initial encounter: Secondary | ICD-10-CM | POA: Diagnosis not present

## 2022-01-04 DIAGNOSIS — Z880 Allergy status to penicillin: Secondary | ICD-10-CM | POA: Diagnosis not present

## 2022-01-04 DIAGNOSIS — W010XXA Fall on same level from slipping, tripping and stumbling without subsequent striking against object, initial encounter: Secondary | ICD-10-CM | POA: Diagnosis not present

## 2022-01-04 DIAGNOSIS — M10071 Idiopathic gout, right ankle and foot: Secondary | ICD-10-CM | POA: Diagnosis not present

## 2022-01-04 DIAGNOSIS — S63501A Unspecified sprain of right wrist, initial encounter: Secondary | ICD-10-CM | POA: Diagnosis not present

## 2022-01-04 DIAGNOSIS — Z79899 Other long term (current) drug therapy: Secondary | ICD-10-CM | POA: Diagnosis not present

## 2022-01-15 ENCOUNTER — Telehealth: Payer: Self-pay | Admitting: Podiatry

## 2022-01-15 NOTE — Telephone Encounter (Signed)
Lmom to call back to schedule time to pick up orthotics  . Balance is $490

## 2022-01-23 DIAGNOSIS — I10 Essential (primary) hypertension: Secondary | ICD-10-CM | POA: Diagnosis not present

## 2022-01-23 DIAGNOSIS — E782 Mixed hyperlipidemia: Secondary | ICD-10-CM | POA: Diagnosis not present

## 2022-01-25 ENCOUNTER — Ambulatory Visit: Payer: Medicare HMO

## 2022-01-25 ENCOUNTER — Encounter: Payer: Self-pay | Admitting: Podiatry

## 2022-01-25 ENCOUNTER — Ambulatory Visit (INDEPENDENT_AMBULATORY_CARE_PROVIDER_SITE_OTHER): Payer: Medicare HMO | Admitting: Podiatry

## 2022-01-25 DIAGNOSIS — S92421A Displaced fracture of distal phalanx of right great toe, initial encounter for closed fracture: Secondary | ICD-10-CM

## 2022-01-25 DIAGNOSIS — I739 Peripheral vascular disease, unspecified: Secondary | ICD-10-CM | POA: Diagnosis not present

## 2022-01-25 DIAGNOSIS — M21619 Bunion of unspecified foot: Secondary | ICD-10-CM

## 2022-01-25 NOTE — Patient Instructions (Signed)
Toe Fracture A toe fracture is a break in one of the toe bones (phalanges). This may happen if you: Drop a heavy object on your toe. Stub your toe. Twist your toe. Exercise the same way too much. What are the signs or symptoms? The main symptoms are swelling and pain in the toe. You may also have: Bruising. Stiffness. Numbness. A change in the way the toe looks. Broken bones that poke through the skin. Blood under the toenail. How is this treated? Treatments may include: Taping the broken toe to a toe that is next to it (buddy taping). Wearing a shoe that has a wide, rigid sole to protect the toe and to limit its movement. Wearing a cast. Surgery. This may be needed if the: Pieces of broken bone are out of place. Bone pokes through the skin. Physical therapy. Follow these instructions at home: If you have a shoe: Wear the shoe as told by your doctor. Remove it only as told by your doctor. Loosen the shoe if your toes tingle, become numb, or turn cold and blue. Keep the shoe clean and dry. If you have a cast: Do not put pressure on any part of the cast until it is fully hardened. This may take a few hours. Do not stick anything inside the cast to scratch your skin. Check the skin around the cast every day. Tell your doctor about any concerns. You may put lotion on dry skin around the edges of the cast. Do not put lotion on the skin under the cast. Keep the cast clean and dry. Bathing Do not take baths, swim, or use a hot tub until your doctor says it is okay. Ask your doctor if you can take showers. If the shoe or cast is not waterproof: Do not let it get wet. Cover it with a watertight covering when you take a bath or a shower. Activity Do not use your foot to support your body weight until your doctor says it is okay. Use crutches as told by your doctor. Ask your doctor what activities are safe for you during recovery. Avoid activities as told by your doctor. Do  exercises as told by your doctor or therapist. Driving Do not drive or use heavy machinery while taking pain medicine. Do not drive while wearing a cast on a foot that you use for driving. Managing pain, stiffness, and swelling  Put ice on the injured area if told by your doctor: Put ice in a plastic bag. Place a towel between your skin and the bag. If you have a shoe, remove it as told by your doctor. If you have a cast, place a towel between your cast and the bag. Leave the ice on for 20 minutes, 2-3 times per day. Raise (elevate) the injured area above the level of your heart while you are sitting or lying down. General instructions If your toe was taped to a toe that is next to it, follow your doctor's instructions for changing the gauze and tape. Change it more often: If the gauze and tape get wet. If this happens, dry the space between the toes. If the gauze and tape are too tight and they cause your toe to become pale or to lose feeling (go numb). If your doctor did not give you a protective shoe, wear sturdy shoes that support your foot. Your shoes should not: Pinch your toes. Fit tightly against your toes. Do not use any tobacco products, including cigarettes, chewing tobacco, or e-cigarettes.  These can delay bone healing. If you need help quitting, ask your doctor. Take medicines only as told by your doctor. Keep all follow-up visits as told by your doctor. This is important. Contact a doctor if: Your pain medicine is not helping. You have a fever. You notice a bad smell coming from your cast. Get help right away if: You lose feeling (have numbness) in your toe or foot, and it is getting worse. Your toe or your foot tingles. Your toe or your foot gets cold or turns blue. You have redness or swelling in your toe or foot, and it is getting worse. You have very bad pain. Summary A toe fracture is a break in one of the toe bones. Use ice and raise your foot. This will help  lessen pain and swelling. Use crutches as told by your doctor. This information is not intended to replace advice given to you by your health care provider. Make sure you discuss any questions you have with your health care provider. Document Revised: 07/16/2020 Document Reviewed: 07/16/2020 Elsevier Patient Education  [employer].

## 2022-01-25 NOTE — Progress Notes (Signed)
Subjective: Started the day after I saw her last. No injuries or falls. [place] and she went to the ER for other issues and while there they looked at the foot and thought it was gout. The symptoms were ongoing prior to the fall. It stings sometimes, but no pain. No open wounds. She does have neuropathy.     Denies any systemic complaints such as fevers, chills, nausea, vomiting. No acute changes since last appointment, and no other complaints at this time.   Objective: AAO x3, NAD DP/PT pulses palpable bilaterally, CRT less than 3 seconds Protective sensation intact with Simms Weinstein monofilament, vibratory sensation intact, Achilles tendon reflex intact No areas of pinpoint bony tenderness or pain with vibratory sensation. MMT 5/5, ROM WNL. No edema, erythema, increase in warmth to bilateral lower extremities.  No open lesions or pre-ulcerative lesions.  No pain with calf compression, swelling, warmth, erythema  Assessment:  Plan: -All treatment options discussed with the patient including all alternatives, risks, complications.  -plan for surgery when grandkids are out of school for the  -Patient encouraged to call the office with any questions, concerns, change in symptoms.

## 2022-02-03 ENCOUNTER — Ambulatory Visit: Payer: Medicare HMO | Admitting: *Deleted

## 2022-02-03 ENCOUNTER — Ambulatory Visit (INDEPENDENT_AMBULATORY_CARE_PROVIDER_SITE_OTHER): Payer: Medicare HMO | Admitting: Podiatry

## 2022-02-03 DIAGNOSIS — S92421A Displaced fracture of distal phalanx of right great toe, initial encounter for closed fracture: Secondary | ICD-10-CM

## 2022-02-03 DIAGNOSIS — Z01818 Encounter for other preprocedural examination: Secondary | ICD-10-CM | POA: Diagnosis not present

## 2022-02-03 DIAGNOSIS — M216X2 Other acquired deformities of left foot: Secondary | ICD-10-CM

## 2022-02-03 DIAGNOSIS — Z01811 Encounter for preprocedural respiratory examination: Secondary | ICD-10-CM

## 2022-02-03 DIAGNOSIS — M216X1 Other acquired deformities of right foot: Secondary | ICD-10-CM

## 2022-02-03 DIAGNOSIS — E559 Vitamin D deficiency, unspecified: Secondary | ICD-10-CM

## 2022-02-03 NOTE — Progress Notes (Signed)
Patient presents today to pick up custom molded foot orthotics, diagnosed with prominent metatarsals by Dr. Jacqualyn Posey.   Orthotics were dispensed and fit was satisfactory. She was only able to try on the left orthotic today. She is wearing a surgical shoe on the right foot for a fractured toe. Reviewed instructions for break-in and wear. Written instructions given to patient.  Patient will follow up as needed.   [facility] - order # [id number]   Patient also is seeing Dr. Jacqualyn Posey for a consult for fracture toe.

## 2022-02-03 NOTE — Patient Instructions (Signed)
Pre-Operative Instructions  Congratulations, you have decided to take an important step to improving your quality of life.  You can be assured that the doctors of [facility] will be with you every step of the way.  Plan to be at the surgery center/hospital at least 1 (one) hour prior to your scheduled time unless otherwise directed by the surgical center/hospital staff.  You must have a responsible adult accompany you, remain during the surgery and drive you home.  Make sure you have directions to the surgical center/hospital and know how to get there on time. For hospital based surgery you will need to obtain a history and physical form from your family physician within 1 month prior to the date of surgery- we will give you a form for you primary physician.  We make every effort to accommodate the date you request for surgery.  There are however, times where surgery dates or times have to be moved.  We will contact you as soon as possible if a change in schedule is required.   No Aspirin/Ibuprofen for one week before surgery.  If you are on aspirin, any non-steroidal anti-inflammatory medications (Mobic, Aleve, Ibuprofen) you should stop taking it 7 days prior to your surgery.  You make take Tylenol  For pain prior to surgery.  Medications- If you are taking daily heart and blood pressure medications, seizure, reflux, allergy, asthma, anxiety, pain or diabetes medications, make sure the surgery center/hospital is aware before the day of surgery so they may notify you which medications to take or avoid the day of surgery. No food or drink after midnight the night before surgery unless directed otherwise by surgical center/hospital staff. No alcoholic beverages 24 hours prior to surgery.  No smoking 24 hours prior to or 24 hours after surgery. Wear loose pants or shorts- loose enough to fit over bandages, boots, and casts. No slip on shoes, sneakers are best. Bring your boot with you to the  surgery center/hospital.  Also bring crutches or a walker if your physician has prescribed it for you.  If you do not have this equipment, it will be provided for you after surgery. If you have not been contracted by the surgery center/hospital by the day before your surgery, call to confirm the date and time of your surgery. Leave-time from work may vary depending on the type of surgery you have.  Appropriate arrangements should be made prior to surgery with your employer. Prescriptions will be provided immediately following surgery by your doctor.  Have these filled as soon as possible after surgery and take the medication as directed. Remove nail polish on the operative foot. Wash the night before surgery.  The night before surgery wash the foot and leg well with the antibacterial soap provided and water paying special attention to beneath the toenails and in between the toes.  Rinse thoroughly with water and dry well with a towel.  Perform this wash unless told not to do so by your physician.  Enclosed: 1 Ice pack (please put in freezer the night before surgery)   1 Hibiclens skin cleaner   Pre-op Instructions  If you have any questions regarding the instructions, do not hesitate to call our office at any point during this process.   [place]: [address] [place], [place] [address] [phone]  [place]: [address] [place], [place] [address] [phone]  Dr. Saidah Kempton, DPM  

## 2022-02-04 ENCOUNTER — Telehealth: Payer: Self-pay | Admitting: Podiatry

## 2022-02-04 NOTE — Telephone Encounter (Signed)
DOS: 02/19/2022  [employer]  Hallux IPJ Fusion Rt ([id number])  Deductible: $0 Out-of-Pocket: $3,400 with $7,471.59 remaining CoInsurance: 0%  The following codes do not require a pre-authorization All services are subject to members benefits, exclusions, limitations and other applicable conditions. Created on 02/04/2022  Service info [phone] Arthrodesis, with extensor hallucis longus transfer to first metatarsal neck, great toe, interphalangeal joint (eg, Jones type procedure)  This document contains confidential information and is protected by the [employer] and Accountability Act (HIPAA), the [place] for [employer] and [place] (HITECH) and a number of other federal and state privacy laws. This document and its contents may only be accessed, used or disclosed by duly authorized individuals in the course of the subject's treatment, claims processing or as otherwise required or permitted by law. Any other access, use or disclosure is strictly prohibited and may result in civil or criminal penalties

## 2022-02-09 NOTE — Progress Notes (Signed)
Subjective: Chief Complaint  Patient presents with   Fracture    Right foot fracture, Patient is still having swelling, patient denies any pain, TX: surgical shoe,    76 year old female presents the office today for ongoing swelling to the right big toe.  She was found to have the toe and we discussed surgical intervention last appointment.  She like to go and schedule this as for her grandkids will be out of school in December.   Objective: AAO x3, NAD DP/PT pulses palpable bilaterally, CRT less than 3 seconds Continuation of swelling on the right hallux with some improvement compared to last appointment.  There is no erythema or warmth associated this is no open lesions.  She does have neuropathy so she does not have significant pain. No pain with calf compression, swelling, warmth, erythema  Assessment: Displaced toe fracture right hallux  Plan: -All treatment options discussed with the patient including all alternatives, risks, complications.  -Repeat x-rays were obtained reviewed.  Comminuted intra-articular fracture with subluxation of the IPJ of the hallux. -Discussed with conservative as well as surgical options.  At this time I do recommend surgical intervention I discussed IPJ arthrodesis.  After discussion she wishes to proceed.- -The incision placement as well as the postoperative course was discussed with the patient. I discussed risks of the surgery which include, but not limited to, infection, bleeding, pain, swelling, need for further surgery, delayed or nonhealing, painful or ugly scar, numbness or sensation changes, over/under correction, recurrence, transfer lesions, further deformity, hardware failure, DVT/PE, loss of toe/foot. Patient understands these risks and wishes to proceed with surgery. The surgical consent was reviewed with the patient all 3 pages were signed. No promises or guarantees were given to the outcome of the procedure. All questions were answered to the  best of my ability. Before the surgery the patient was encouraged to call the office if there is any further questions. The surgery will be performed at the [facility] on an outpatient basis. -I have ordered blood work including vitamin D, uric acid, BMP, CBC -Continue in surgical shoe.  Trula Slade DPM

## 2022-02-10 ENCOUNTER — Ambulatory Visit (HOSPITAL_COMMUNITY)
Admission: RE | Admit: 2022-02-10 | Discharge: 2022-02-10 | Disposition: A | Discharge status: Home / Self Care | Payer: Medicare HMO | Source: Ambulatory Visit | Attending: Podiatry | Admitting: Podiatry

## 2022-02-10 DIAGNOSIS — I739 Peripheral vascular disease, unspecified: Secondary | ICD-10-CM | POA: Insufficient documentation

## 2022-02-14 DIAGNOSIS — Z01818 Encounter for other preprocedural examination: Secondary | ICD-10-CM | POA: Diagnosis not present

## 2022-02-14 DIAGNOSIS — E559 Vitamin D deficiency, unspecified: Secondary | ICD-10-CM | POA: Diagnosis not present

## 2022-02-15 ENCOUNTER — Other Ambulatory Visit: Payer: Self-pay | Admitting: Podiatry

## 2022-02-15 LAB — BASIC METABOLIC PANEL
BUN/Creatinine Ratio: 16 (ref 12–28)
BUN: 19 mg/dL (ref 8–27)
CO2: 23 mmol/L (ref 20–29)
Calcium: 9.1 mg/dL (ref 8.7–10.3)
Chloride: 103 mmol/L (ref 96–106)
Creatinine, Ser: 1.17 mg/dL — ABNORMAL HIGH (ref 0.57–1.00)
Glucose: 104 mg/dL — ABNORMAL HIGH (ref 70–99)
Potassium: 3.8 mmol/L (ref 3.5–5.2)
Sodium: 141 mmol/L (ref 134–144)
eGFR: 48 mL/min/{1.73_m2} — ABNORMAL LOW (ref >59)

## 2022-02-15 LAB — CBC WITH DIFFERENTIAL/PLATELET
Basophils Absolute: 0 10*3/uL (ref 0.0–0.2)
Basos: 1 %
EOS (ABSOLUTE): 0.2 10*3/uL (ref 0.0–0.4)
Eos: 4 %
Hematocrit: 33 % — ABNORMAL LOW (ref 34.0–46.6)
Hemoglobin: 11.4 g/dL (ref 11.1–15.9)
Immature Grans (Abs): 0 10*3/uL (ref 0.0–0.1)
Immature Granulocytes: 0 %
Lymphocytes Absolute: 1.7 10*3/uL (ref 0.7–3.1)
Lymphs: 31 %
MCH: 31.5 pg (ref 26.6–33.0)
MCHC: 34.5 g/dL (ref 31.5–35.7)
MCV: 91 fL (ref 79–97)
Monocytes Absolute: 0.4 10*3/uL (ref 0.1–0.9)
Monocytes: 8 %
Neutrophils Absolute: 3.2 10*3/uL (ref 1.4–7.0)
Neutrophils: 56 %
Platelets: 186 10*3/uL (ref 150–450)
RBC: 3.62 x10E6/uL — ABNORMAL LOW (ref 3.77–5.28)
RDW: 12.1 % (ref 11.7–15.4)
WBC: 5.6 10*3/uL (ref 3.4–10.8)

## 2022-02-15 LAB — VITAMIN D 25 HYDROXY (VIT D DEFICIENCY, FRACTURES): Vit D, 25-Hydroxy: 24.7 ng/mL — ABNORMAL LOW (ref 30.0–100.0)

## 2022-02-15 LAB — URIC ACID: Uric Acid: 9.1 mg/dL — ABNORMAL HIGH (ref 3.1–7.9)

## 2022-02-15 MED ORDER — COLCHICINE 0.6 MG PO TABS: 0.6000 mg | ORAL_TABLET | Freq: Every day | ORAL | 0 refills | Status: AC

## 2022-02-19 ENCOUNTER — Other Ambulatory Visit: Payer: Self-pay | Admitting: Podiatry

## 2022-02-19 DIAGNOSIS — S92411A Displaced fracture of proximal phalanx of right great toe, initial encounter for closed fracture: Secondary | ICD-10-CM | POA: Diagnosis not present

## 2022-02-19 DIAGNOSIS — S92421S Displaced fracture of distal phalanx of right great toe, sequela: Secondary | ICD-10-CM | POA: Diagnosis not present

## 2022-02-19 DIAGNOSIS — M898X7 Other specified disorders of bone, ankle and foot: Secondary | ICD-10-CM | POA: Diagnosis not present

## 2022-02-19 DIAGNOSIS — M79674 Pain in right toe(s): Secondary | ICD-10-CM | POA: Diagnosis not present

## 2022-02-19 DIAGNOSIS — S92421A Displaced fracture of distal phalanx of right great toe, initial encounter for closed fracture: Secondary | ICD-10-CM | POA: Diagnosis not present

## 2022-02-19 MED ORDER — TRAMADOL HCL 50 MG PO TABS: 50.0000 mg | ORAL_TABLET | Freq: Three times a day (TID) | ORAL | 0 refills | Status: AC | PRN

## 2022-02-19 MED ORDER — DOXYCYCLINE HYCLATE 100 MG PO TABS: 100.0000 mg | ORAL_TABLET | Freq: Two times a day (BID) | ORAL | 0 refills | Status: DC

## 2022-02-19 MED ORDER — PROMETHAZINE HCL 25 MG PO TABS: 25.0000 mg | ORAL_TABLET | Freq: Three times a day (TID) | ORAL | 0 refills | Status: AC | PRN

## 2022-02-19 NOTE — Progress Notes (Signed)
Postop medications sent 

## 2022-02-25 ENCOUNTER — Ambulatory Visit (INDEPENDENT_AMBULATORY_CARE_PROVIDER_SITE_OTHER): Payer: Medicare HMO | Admitting: Podiatry

## 2022-02-25 ENCOUNTER — Ambulatory Visit (INDEPENDENT_AMBULATORY_CARE_PROVIDER_SITE_OTHER): Payer: Medicare HMO

## 2022-02-25 ENCOUNTER — Encounter: Payer: Self-pay | Admitting: Podiatry

## 2022-02-25 VITALS — BP 154/79 | HR 55 | Temp 96.5°F

## 2022-02-25 DIAGNOSIS — M1000 Idiopathic gout, unspecified site: Secondary | ICD-10-CM

## 2022-02-25 DIAGNOSIS — S92421D Displaced fracture of distal phalanx of right great toe, subsequent encounter for fracture with routine healing: Secondary | ICD-10-CM

## 2022-02-25 DIAGNOSIS — Z9889 Other specified postprocedural states: Secondary | ICD-10-CM

## 2022-02-25 MED ORDER — CLINDAMYCIN HCL 300 MG PO CAPS: 300.0000 mg | ORAL_CAPSULE | Freq: Three times a day (TID) | ORAL | 0 refills | Status: DC

## 2022-02-28 NOTE — Progress Notes (Signed)
Subjective: Chief Complaint  Patient presents with   Routine Post Op    POV #1 DOS 02/19/2022 RT FOOT FUSION OF BIG TOE IPJ   "Its felt fine, haven't had any pain"    Sabrina Taylor is a 77 y.o. is seen today in office s/p right first IPJ arthrodesis preformed on 02/19/2022.  She has not had any pain although she does have neuropathy.  Pain in the surgical shoe.  Denies any systemic complaints such as fevers, chills, nausea, vomiting. No calf pain, chest pain, shortness of breath.   Objective: General: No acute distress, AAOx3  DP/PT pulses palpable 2/4, CRT < 3 sec to all digits.  Right foot: Incision is well coapted without any evidence of dehiscence and sutures are intact. There is still edema and mild erythema to the toe.  This was present prior to the surgery as well and does not appear to be significantly worsened.  There is no drainage or pus coming from the incision.  No pain on exam.  Toes are in rectus position. No other open lesions or pre-ulcerative lesions.  No pain with calf compression, swelling, warmth, erythema.   Assessment and Plan:  Status post right first IPJ arthrodesis, doing well with no complications   -Treatment options discussed including all alternatives, risks, and complications -X-rays were obtained and reviewed.  3 views of the foot were obtained.  On the AP and lateral views the toe appears to be in rectus position.  The oblique view is being distal phalanx is slightly dorsiflexed.  Hardware intact -Antibiotic ointment and bandage applied.  Keep the dressing clean, dry, intact discussed later in the week she can change the bandage if needed and apply a similar bandage. -Continue surgical shoe -Ice/elevation -Continue antibiotics- Clindamycin -She started colchicine the day before surgery 1.  Continue that for now as well.  I like to recheck her uric acid level next week and an order was given. -Pain medication as needed. -Monitor for any clinical signs or  symptoms of infection and DVT/PE and directed to call the office immediately should any occur or go to the ER. -Follow-up as scheduled or sooner if any problems arise. In the meantime, encouraged to call the office with any questions, concerns, change in symptoms.   Celesta Gentile, DPM

## 2022-03-03 ENCOUNTER — Encounter: Payer: Self-pay | Admitting: Podiatry

## 2022-03-04 ENCOUNTER — Encounter: Payer: Self-pay | Admitting: Podiatry

## 2022-03-06 ENCOUNTER — Other Ambulatory Visit: Payer: Medicare HMO

## 2022-03-06 ENCOUNTER — Ambulatory Visit (INDEPENDENT_AMBULATORY_CARE_PROVIDER_SITE_OTHER): Payer: Medicare HMO | Admitting: Podiatry

## 2022-03-06 VITALS — BP 142/70 | HR 62

## 2022-03-06 DIAGNOSIS — Z9889 Other specified postprocedural states: Secondary | ICD-10-CM | POA: Diagnosis not present

## 2022-03-06 DIAGNOSIS — S92421D Displaced fracture of distal phalanx of right great toe, subsequent encounter for fracture with routine healing: Secondary | ICD-10-CM

## 2022-03-06 NOTE — Progress Notes (Signed)
Patient presents today for post op visit # 2, patient of Dr. Jacqualyn Posey.   POV # 2 DOS 02/19/22 Right foot fusion of big toe IPJ    Patient presents in their surgical shoe. Denies any falls or injury to the foot. Right hallux is swollen. No signs of infection. No calf pain or shortness of breath. Bandages dry and intact. Incision is intact. Half of the sutures were removed today and patient tolerated suture removal well.   BP: 142/70 P: 62     Dr. Jacqualyn Posey did take a look at her foot today as well.   Foot redressed today and placed back in the boot. Reviewed icing and elevation. Patient will follow up with Dr. Jacqualyn Posey next week for POV# 3 and suture removal.  -  Patient healing well there is decreased edema and erythema to the toe.  There is no drainage or pus.  She has no pain although she has some neuropathy.  We removed every other suture.  Antibiotic ointment and bandage applied.  Will plan on removing the remaining sutures next week.  She has not yet had a blood work that she is having at this time this week.  Follow-up as scheduled or sooner if any issues are to arise.  Trula Slade DPM

## 2022-03-07 DIAGNOSIS — M1000 Idiopathic gout, unspecified site: Secondary | ICD-10-CM | POA: Diagnosis not present

## 2022-03-08 LAB — BASIC METABOLIC PANEL
BUN/Creatinine Ratio: 17 (ref 12–28)
BUN: 19 mg/dL (ref 8–27)
CO2: 24 mmol/L (ref 20–29)
Calcium: 9 mg/dL (ref 8.7–10.3)
Chloride: 105 mmol/L (ref 96–106)
Creatinine, Ser: 1.14 mg/dL — ABNORMAL HIGH (ref 0.57–1.00)
Glucose: 89 mg/dL (ref 70–99)
Potassium: 4 mmol/L (ref 3.5–5.2)
Sodium: 142 mmol/L (ref 134–144)
eGFR: 50 mL/min/{1.73_m2} — ABNORMAL LOW (ref >59)

## 2022-03-08 LAB — URIC ACID: Uric Acid: 9 mg/dL — ABNORMAL HIGH (ref 3.1–7.9)

## 2022-03-11 ENCOUNTER — Other Ambulatory Visit: Payer: Self-pay | Admitting: Podiatry

## 2022-03-11 MED ORDER — ALLOPURINOL 100 MG PO TABS: 100.0000 mg | ORAL_TABLET | Freq: Every day | ORAL | 0 refills | Status: AC

## 2022-03-13 ENCOUNTER — Ambulatory Visit (INDEPENDENT_AMBULATORY_CARE_PROVIDER_SITE_OTHER): Payer: Medicare HMO | Admitting: Podiatry

## 2022-03-13 DIAGNOSIS — M1000 Idiopathic gout, unspecified site: Secondary | ICD-10-CM

## 2022-03-13 DIAGNOSIS — S92421D Displaced fracture of distal phalanx of right great toe, subsequent encounter for fracture with routine healing: Secondary | ICD-10-CM

## 2022-03-13 DIAGNOSIS — Z9889 Other specified postprocedural states: Secondary | ICD-10-CM

## 2022-03-16 NOTE — Progress Notes (Signed)
Subjective: Chief Complaint  Patient presents with   Suture / Staple Removal    Right foot      Sabrina Taylor is a 77 y.o. is seen today in office s/p right first IPJ arthrodesis preformed on 02/19/2022.  States that she is doing well.  She presents today for suture removal.  She has not yet started the allopurinol that I prescribed for her given her still elevated uric acid.  Denies any fevers or chills.  She has no other concerns.  Objective: General: No acute distress, AAOx3  DP/PT pulses palpable 2/4, CRT < 3 sec to all digits.  Right foot: Incision is well coapted without any evidence of dehiscence and sutures are intact.  There is still edema present there is no erythema or warmth.  No drainage or pus.  No fluctuance or crepitation but there is no malodor.  Toe is in a rectus position.  No pain with calf compression, swelling, warmth, erythema.   Assessment and Plan:  Status post right first IPJ arthrodesis, doing well with no complications   -Treatment options discussed including all alternatives, risks, and complications -Sutures removed today.  Steri-Strips applied for reinforcement followed by antibiotic ointment and bandage.  Discussed that I do not want her to soak the foot but she can wash with soap and water and apply a similar bandage.  Continue surgical shoe for now.  Continue ice, elevate. -Allopurinol for increased uric acid. -Monitor for any clinical signs or symptoms of infection and directed to call the office immediately should any occur or go to the ER.  Return in about 2 weeks (around 03/27/2022).  X-ray next appointment  Trula Slade DPM

## 2022-03-20 ENCOUNTER — Encounter: Payer: Medicare HMO | Admitting: Podiatry

## 2022-03-25 ENCOUNTER — Ambulatory Visit (INDEPENDENT_AMBULATORY_CARE_PROVIDER_SITE_OTHER): Payer: Medicare HMO | Admitting: Podiatry

## 2022-03-25 ENCOUNTER — Ambulatory Visit (INDEPENDENT_AMBULATORY_CARE_PROVIDER_SITE_OTHER): Payer: Medicare HMO

## 2022-03-25 DIAGNOSIS — Z9889 Other specified postprocedural states: Secondary | ICD-10-CM | POA: Diagnosis not present

## 2022-03-25 DIAGNOSIS — S92413G Displaced fracture of proximal phalanx of unspecified great toe, subsequent encounter for fracture with delayed healing: Secondary | ICD-10-CM

## 2022-03-25 NOTE — Progress Notes (Signed)
Subjective: Chief Complaint  Patient presents with   Post-op Follow-up    POV #3 DOS 02/19/2022 RT FOOT FUSION OF BIG TOE IPJ, Patient stated she is doing well      Sabrina Taylor is a 77 y.o. is seen today in office s/p right first IPJ arthrodesis preformed on 02/19/2022.  She has not been experiencing any pain although she does have neuropathy.  Incisions been healing well she denies any drainage or pus.  She denies any fevers or chills.     Objective: General: No acute distress, AAOx3  DP/PT pulses palpable 2/4, CRT < 3 sec to all digits.  Right foot: Incision is well coapted without any evidence of dehiscence the scar is forming.  There is still edema present with faint erythema but this appears to be improved compared to previous appointments.  There is no drainage or pus and there is no fluctuance or crepitation.  There is no malodor.  Toe is rectus. No pain with calf compression, swelling, warmth, erythema.   Assessment and Plan:  Status post right first IPJ arthrodesis  -X-rays obtained reviewed.  3 views of the foot were obtained.  Hardware intact across the hallux IPJ.  There is a step-off present.  Clinically the toe is rectus position. -At this time continue remain in surgical shoe.  Continue ice, elevate as well as compression to help with the edema. -Recheck uric acid level next appointment -Monitor for any clinical signs or symptoms of infection and directed to call the office immediately should any occur or go to the ER.  Return in about 4 weeks (around 04/22/2022).  X-ray next appointment  Trula Slade DPM

## 2022-04-01 DIAGNOSIS — E6609 Other obesity due to excess calories: Secondary | ICD-10-CM | POA: Diagnosis not present

## 2022-04-01 DIAGNOSIS — Z6830 Body mass index (BMI) 30.0-30.9, adult: Secondary | ICD-10-CM | POA: Diagnosis not present

## 2022-04-01 DIAGNOSIS — E1140 Type 2 diabetes mellitus with diabetic neuropathy, unspecified: Secondary | ICD-10-CM | POA: Diagnosis not present

## 2022-04-01 DIAGNOSIS — I639 Cerebral infarction, unspecified: Secondary | ICD-10-CM | POA: Diagnosis not present

## 2022-04-01 DIAGNOSIS — G629 Polyneuropathy, unspecified: Secondary | ICD-10-CM | POA: Diagnosis not present

## 2022-04-01 DIAGNOSIS — E119 Type 2 diabetes mellitus without complications: Secondary | ICD-10-CM | POA: Diagnosis not present

## 2022-04-01 DIAGNOSIS — E782 Mixed hyperlipidemia: Secondary | ICD-10-CM | POA: Diagnosis not present

## 2022-04-01 DIAGNOSIS — I10 Essential (primary) hypertension: Secondary | ICD-10-CM | POA: Diagnosis not present

## 2022-04-01 DIAGNOSIS — G8929 Other chronic pain: Secondary | ICD-10-CM | POA: Diagnosis not present

## 2022-04-22 ENCOUNTER — Ambulatory Visit (INDEPENDENT_AMBULATORY_CARE_PROVIDER_SITE_OTHER): Payer: Medicare HMO | Admitting: Podiatry

## 2022-04-22 DIAGNOSIS — S92413G Displaced fracture of proximal phalanx of unspecified great toe, subsequent encounter for fracture with delayed healing: Secondary | ICD-10-CM

## 2022-04-22 DIAGNOSIS — M1000 Idiopathic gout, unspecified site: Secondary | ICD-10-CM

## 2022-04-22 NOTE — Progress Notes (Signed)
Subjective: Chief Complaint  Patient presents with   Routine Post Op    Rm 11 POV #4 DOS 02/19/2022 RT FOOT FUSION OF BIG TOE IPJ. Pt states she is doing very well. No NV, fever or chills or concerns.    Nail Problem    RFC. Pt requesting nail trim.      Sabrina Taylor is a 77 y.o. is seen today in office s/p right first IPJ arthrodesis preformed on 02/19/2022.  States she still having some swelling.  She tried to get back conservative shoe but was uncomfortable given the swelling.  No recent injury or changes otherwise.  Denies any fevers or chills.    Objective: General: No acute distress, AAOx3  DP/PT pulses palpable 2/4, CRT < 3 sec to all digits.  Right foot: Incision is well coapted without any evidence of dehiscence the scar is forming.  There is still some residual edema present but appears to be improved.  There is no cellulitis noted.  Toe clinically appears to be rectus.  There is no fluctuance or crepitus.  There is no malodor.  Hammertoe contractures present to the other digits.  No pain with calf compression, swelling, warmth, erythema.   Assessment and Plan:  Status post right first IPJ arthrodesis  -X-rays obtained reviewed.  3 views of the foot were obtained.  Hardware intact across the hallux IPJ.  Increased consolidation noted.  There is a step-off present.  Clinically the toe is rectus position. -At this time continue remain in surgical shoe.  Discussed that she can start to transition to regular shoe as tolerated.  Continue ice, elevate as well as compression to help with the edema. -Recheck uric acid level -Monitor for any clinical signs or symptoms of infection and directed to call the office immediately should any occur or go to the ER.  Return in about 4 weeks (around 05/20/2022) for post-op visit, x-ray.  Trula Slade DPM

## 2022-05-25 DIAGNOSIS — I10 Essential (primary) hypertension: Secondary | ICD-10-CM | POA: Diagnosis not present

## 2022-05-25 DIAGNOSIS — E1159 Type 2 diabetes mellitus with other circulatory complications: Secondary | ICD-10-CM | POA: Diagnosis not present

## 2022-06-05 ENCOUNTER — Ambulatory Visit (INDEPENDENT_AMBULATORY_CARE_PROVIDER_SITE_OTHER): Payer: Medicare HMO | Admitting: Podiatry

## 2022-06-05 ENCOUNTER — Ambulatory Visit (INDEPENDENT_AMBULATORY_CARE_PROVIDER_SITE_OTHER): Payer: Medicare HMO

## 2022-06-05 VITALS — BP 149/75 | HR 95 | Temp 97.8°F

## 2022-06-05 DIAGNOSIS — M79672 Pain in left foot: Secondary | ICD-10-CM

## 2022-06-05 DIAGNOSIS — M79675 Pain in left toe(s): Secondary | ICD-10-CM | POA: Diagnosis not present

## 2022-06-05 DIAGNOSIS — M79674 Pain in right toe(s): Secondary | ICD-10-CM | POA: Diagnosis not present

## 2022-06-05 DIAGNOSIS — B351 Tinea unguium: Secondary | ICD-10-CM

## 2022-06-05 DIAGNOSIS — S92413G Displaced fracture of proximal phalanx of unspecified great toe, subsequent encounter for fracture with delayed healing: Secondary | ICD-10-CM

## 2022-06-05 DIAGNOSIS — Z7901 Long term (current) use of anticoagulants: Secondary | ICD-10-CM

## 2022-06-05 NOTE — Progress Notes (Signed)
Subjective: Chief Complaint  Patient presents with   Routine Post Op    POV #5 DOS 02/19/2022 RT FOOT FUSION OF BIG TOE IPJPatient states overall no concerns. She still does have some swellen and feels that it come from her blood pressure medication. Patient states that she hasn't experience any pain      Sabrina Taylor is a 77 y.o. is seen today in office s/p right first IPJ arthrodesis preformed on 02/19/2022.  States that she has been doing well and she has some swelling to the big toe but no redness or warmth.  Nails also thickened elongated she has difficulty trimming them causing discomfort.  No open lesions today.  No fevers or chills.    Objective: General: No acute distress, AAOx3  DP/PT pulses palpable 2/4, CRT < 3 sec to all digits.  Right foot: Incision is well coapted without any evidence of dehiscence the scar is forming.  Toe is rectus to the still some mild edema present there is no erythema or warmth. Is actually prepared to what it was previously.  I did discuss that appears to be stable. Nails are hypertrophic, dystrophic, brittle, discolored, elongated 10. No surrounding redness or drainage. Tenderness nails 1-5 bilaterally. No open lesions or pre-ulcerative lesions are identified today. No pain with calf compression, swelling, warmth, erythema.   Assessment and Plan:  Status post right first IPJ arthrodesis; symptomatic onychomycosis  -X-rays obtained reviewed.  3 views of the foot were obtained.  Hardware intact across the hallux IPJ.  Increased consolidation noted.  Bone callus is present.  Hardware intact for any complications. -Continue with supportive shoes.  Discussed this can take some time if he does want to continue to improve and actually appears to be better today.  Monitor any signs or symptoms of infection -Sharp debrided nails x 10 without any complications or bleeding.  Return in about 9 weeks (around 08/07/2022) for post-op x-ray, routine  care.  Vivi Barrack DPM

## 2022-06-10 ENCOUNTER — Ambulatory Visit: Payer: Medicare HMO | Admitting: Podiatry

## 2022-07-15 ENCOUNTER — Other Ambulatory Visit (HOSPITAL_COMMUNITY): Payer: Self-pay | Admitting: Family Medicine

## 2022-07-15 ENCOUNTER — Encounter (HOSPITAL_COMMUNITY): Payer: Self-pay | Admitting: Family Medicine

## 2022-07-15 DIAGNOSIS — Z6830 Body mass index (BMI) 30.0-30.9, adult: Secondary | ICD-10-CM | POA: Diagnosis not present

## 2022-07-15 DIAGNOSIS — I10 Essential (primary) hypertension: Secondary | ICD-10-CM | POA: Diagnosis not present

## 2022-07-15 DIAGNOSIS — I639 Cerebral infarction, unspecified: Secondary | ICD-10-CM | POA: Diagnosis not present

## 2022-07-15 DIAGNOSIS — E1159 Type 2 diabetes mellitus with other circulatory complications: Secondary | ICD-10-CM | POA: Diagnosis not present

## 2022-07-15 DIAGNOSIS — Z1231 Encounter for screening mammogram for malignant neoplasm of breast: Secondary | ICD-10-CM

## 2022-07-15 DIAGNOSIS — E782 Mixed hyperlipidemia: Secondary | ICD-10-CM | POA: Diagnosis not present

## 2022-07-15 DIAGNOSIS — E6609 Other obesity due to excess calories: Secondary | ICD-10-CM | POA: Diagnosis not present

## 2022-07-15 DIAGNOSIS — Z1331 Encounter for screening for depression: Secondary | ICD-10-CM | POA: Diagnosis not present

## 2022-07-15 DIAGNOSIS — E7849 Other hyperlipidemia: Secondary | ICD-10-CM | POA: Diagnosis not present

## 2022-07-15 DIAGNOSIS — Z0001 Encounter for general adult medical examination with abnormal findings: Secondary | ICD-10-CM | POA: Diagnosis not present

## 2022-08-07 ENCOUNTER — Ambulatory Visit: Payer: Medicare HMO | Admitting: Podiatry

## 2022-08-07 ENCOUNTER — Ambulatory Visit (INDEPENDENT_AMBULATORY_CARE_PROVIDER_SITE_OTHER): Payer: Medicare HMO

## 2022-08-07 DIAGNOSIS — Z7901 Long term (current) use of anticoagulants: Secondary | ICD-10-CM

## 2022-08-07 DIAGNOSIS — L84 Corns and callosities: Secondary | ICD-10-CM

## 2022-08-07 DIAGNOSIS — S92413G Displaced fracture of proximal phalanx of unspecified great toe, subsequent encounter for fracture with delayed healing: Secondary | ICD-10-CM

## 2022-08-07 DIAGNOSIS — M79674 Pain in right toe(s): Secondary | ICD-10-CM

## 2022-08-07 DIAGNOSIS — M79675 Pain in left toe(s): Secondary | ICD-10-CM | POA: Diagnosis not present

## 2022-08-07 DIAGNOSIS — B351 Tinea unguium: Secondary | ICD-10-CM | POA: Diagnosis not present

## 2022-08-07 DIAGNOSIS — M79671 Pain in right foot: Secondary | ICD-10-CM

## 2022-08-08 NOTE — Progress Notes (Signed)
Subjective: Chief Complaint  Patient presents with   Routine Post Op    DOS 02/19/2022 RT FOOT FUSION OF BIG TOE IPJ     Sabrina Taylor is a 77 y.o. is seen today in office s/p right first IPJ arthrodesis preformed on 02/19/2022.  Also presents today to have her nails trimmed as they are thickened discolored causing discomfort.  She also has a callus on the bottom of her right foot that causes pain.  No recent injuries or changes otherwise.   Objective: General: No acute distress, AAOx3  DP/PT pulses palpable 2/4, CRT < 3 sec to all digits.  Right foot: Incision is well coapted without any evidence of dehiscence the scar is forming.  Toe is rectus to the still some mild, but much improved edema present there is no erythema or warmth. Nails are hypertrophic, dystrophic, brittle, discolored, elongated 10. No surrounding redness or drainage. Tenderness nails 1-5 bilaterally.  No open lesions. Hyperkeratotic lesion right foot submetatarsal 3 on area of bony prominence.  There is no underlying ulceration, drainage or any signs of infection. No pain with calf compression, swelling, warmth, erythema.   Assessment and Plan:  Status post right first IPJ arthrodesis; symptomatic onychomycosis  -X-rays obtained reviewed.  3 views of the foot were obtained.  Hardware intact across the hallux IPJ.  Increased consolidation noted.  Hardware intact and complicating factors. -Continue supportive shoe gear.  Discussed swelling may continue improve some will continue to monitor. -Sharp debrided hyperkeratotic lesion times well any complications or bleeding -Sharp debrided nails x 10 without any complications or bleeding. -Daily foot inspection.  Return in about 3 months (around 11/07/2022) for routine care.  Sabrina Taylor DPM

## 2022-08-11 DIAGNOSIS — H65191 Other acute nonsuppurative otitis media, right ear: Secondary | ICD-10-CM | POA: Diagnosis not present

## 2022-08-11 DIAGNOSIS — I951 Orthostatic hypotension: Secondary | ICD-10-CM | POA: Diagnosis not present

## 2022-08-11 DIAGNOSIS — J069 Acute upper respiratory infection, unspecified: Secondary | ICD-10-CM | POA: Diagnosis not present

## 2022-08-11 DIAGNOSIS — R059 Cough, unspecified: Secondary | ICD-10-CM | POA: Diagnosis not present

## 2022-10-07 DIAGNOSIS — Z6831 Body mass index (BMI) 31.0-31.9, adult: Secondary | ICD-10-CM | POA: Diagnosis not present

## 2022-10-07 DIAGNOSIS — E669 Obesity, unspecified: Secondary | ICD-10-CM | POA: Diagnosis not present

## 2022-10-07 DIAGNOSIS — R509 Fever, unspecified: Secondary | ICD-10-CM | POA: Diagnosis not present

## 2022-10-07 DIAGNOSIS — U071 COVID-19: Secondary | ICD-10-CM | POA: Diagnosis not present

## 2022-10-31 ENCOUNTER — Ambulatory Visit (HOSPITAL_COMMUNITY)
Admission: RE | Admit: 2022-10-31 | Discharge: 2022-10-31 | Disposition: A | Discharge status: Home / Self Care | Payer: Medicare HMO | Source: Ambulatory Visit | Attending: Family Medicine | Admitting: Family Medicine

## 2022-10-31 DIAGNOSIS — Z1231 Encounter for screening mammogram for malignant neoplasm of breast: Secondary | ICD-10-CM | POA: Diagnosis not present

## 2022-11-04 ENCOUNTER — Ambulatory Visit: Payer: Medicare HMO | Admitting: Podiatry

## 2022-11-04 DIAGNOSIS — M79674 Pain in right toe(s): Secondary | ICD-10-CM | POA: Diagnosis not present

## 2022-11-04 DIAGNOSIS — L84 Corns and callosities: Secondary | ICD-10-CM

## 2022-11-04 DIAGNOSIS — Z7901 Long term (current) use of anticoagulants: Secondary | ICD-10-CM | POA: Diagnosis not present

## 2022-11-04 DIAGNOSIS — B351 Tinea unguium: Secondary | ICD-10-CM

## 2022-11-04 DIAGNOSIS — M79675 Pain in left toe(s): Secondary | ICD-10-CM | POA: Diagnosis not present

## 2022-11-04 NOTE — Progress Notes (Signed)
Subjective: No chief complaint on file.    Sabrina Taylor is a 77 y.o. is seen today in office today for concerns of a painful callus on the bottom of her right foot as well as have her nails trimmed as are thickened elongated she cannot do them herself.  So this x 1 to the big toe but no pain although she does have neuropathy.  No recent injuries or changes otherwise.  No new concerns.    She is on Plavix.  Objective: General: No acute distress, AAOx3  DP/PT pulses palpable 2/4, CRT < 3 sec to all digits.  Right foot: Incision well coapted.  There is still some slight edema present to the hallux but appears to be improving.  There is no erythema or warmth or signs of infection. Nails are hypertrophic, dystrophic, brittle, discolored, elongated 10. No surrounding redness or drainage. Tenderness nails 1-5 bilaterally.  No open lesions. Hyperkeratotic lesion right foot submetatarsal 3 on area of bony prominence.  There is some dried blood present underneath the area is preulcerative there is no skin breakdown today.  There is no underlying ulceration, drainage or any signs of infection. No pain with calf compression, swelling, warmth, erythema.   Assessment and Plan:  Preulcerative callus; symptomatic onychomycosis  -Sharp debrided hyperkeratotic lesion times well any complications or bleeding.  Dispensed offloading pads -Sharp debrided nails x 10 without any complications or bleeding. -Daily foot inspection. -Swelling to the big toe seems to be improving.  Hopefully once this improves the more she can get back to orthotics for offloading.  Return in about 3 months (around 02/03/2023).   Vivi Barrack DPM

## 2022-11-07 ENCOUNTER — Ambulatory Visit: Payer: Medicare HMO | Admitting: Podiatry

## 2023-02-03 ENCOUNTER — Ambulatory Visit: Payer: Medicare HMO | Admitting: Podiatry

## 2023-02-03 ENCOUNTER — Encounter: Payer: Self-pay | Admitting: Podiatry

## 2023-02-03 DIAGNOSIS — B351 Tinea unguium: Secondary | ICD-10-CM | POA: Diagnosis not present

## 2023-02-03 DIAGNOSIS — Z7901 Long term (current) use of anticoagulants: Secondary | ICD-10-CM | POA: Diagnosis not present

## 2023-02-03 DIAGNOSIS — L84 Corns and callosities: Secondary | ICD-10-CM | POA: Diagnosis not present

## 2023-02-03 DIAGNOSIS — M79675 Pain in left toe(s): Secondary | ICD-10-CM

## 2023-02-03 DIAGNOSIS — M79674 Pain in right toe(s): Secondary | ICD-10-CM

## 2023-02-04 NOTE — Progress Notes (Signed)
Subjective: Chief Complaint  Patient presents with   RFC    RM#11 RFC patient has no concerns today just nail trim.     RONESHA URANGA is a 77 y.o. is seen today in office today for concerns of a painful callus on the bottom of her right foot as well as have her nails trimmed as are thickened elongated she cannot do them herself.  Her big toes been doing well she states.  She had some swelling but no pain although she does have neuropathy.  She is on Plavix.  Objective: General: No acute distress, AAOx3  DP/PT pulses palpable 2/4, CRT < 3 sec to all digits.  Nails are hypertrophic, dystrophic, brittle, discolored, elongated 10. No surrounding redness or drainage. Tenderness nails 1-5 bilaterally.  No open lesions. Hyperkeratotic lesion right foot submetatarsal 3 on area of bony prominence.  There is some dried blood present underneath the area is preulcerative there is no skin breakdown today.  This appears to be stable.  There is no underlying ulceration, drainage or any signs of infection. No pain with calf compression, swelling, warmth, erythema.   Assessment and Plan:  Preulcerative callus; symptomatic onychomycosis  -Sharp debrided hyperkeratotic lesion times well any complications or bleeding.  Dispensed offloading pads -Sharp debrided nails x 10 without any complications or bleeding. -Sharply debrided the hyperkeratotic lesion x 1 without any complications or bleeding. -Daily foot inspection.  Return in about 3 months (around 05/04/2023).   Vivi Barrack DPM

## 2023-03-18 DIAGNOSIS — Z6829 Body mass index (BMI) 29.0-29.9, adult: Secondary | ICD-10-CM | POA: Diagnosis not present

## 2023-03-18 DIAGNOSIS — E559 Vitamin D deficiency, unspecified: Secondary | ICD-10-CM | POA: Diagnosis not present

## 2023-03-18 DIAGNOSIS — G629 Polyneuropathy, unspecified: Secondary | ICD-10-CM | POA: Diagnosis not present

## 2023-03-18 DIAGNOSIS — E663 Overweight: Secondary | ICD-10-CM | POA: Diagnosis not present

## 2023-03-18 DIAGNOSIS — I10 Essential (primary) hypertension: Secondary | ICD-10-CM | POA: Diagnosis not present

## 2023-03-18 DIAGNOSIS — I951 Orthostatic hypotension: Secondary | ICD-10-CM | POA: Diagnosis not present

## 2023-03-18 DIAGNOSIS — E782 Mixed hyperlipidemia: Secondary | ICD-10-CM | POA: Diagnosis not present

## 2023-03-18 DIAGNOSIS — R7303 Prediabetes: Secondary | ICD-10-CM | POA: Diagnosis not present

## 2023-03-18 DIAGNOSIS — R42 Dizziness and giddiness: Secondary | ICD-10-CM | POA: Diagnosis not present

## 2023-03-18 DIAGNOSIS — I952 Hypotension due to drugs: Secondary | ICD-10-CM | POA: Diagnosis not present

## 2023-03-30 DIAGNOSIS — R8290 Unspecified abnormal findings in urine: Secondary | ICD-10-CM | POA: Diagnosis not present

## 2023-03-30 DIAGNOSIS — N184 Chronic kidney disease, stage 4 (severe): Secondary | ICD-10-CM | POA: Diagnosis not present

## 2023-03-30 DIAGNOSIS — E1122 Type 2 diabetes mellitus with diabetic chronic kidney disease: Secondary | ICD-10-CM | POA: Diagnosis not present

## 2023-03-30 DIAGNOSIS — E785 Hyperlipidemia, unspecified: Secondary | ICD-10-CM | POA: Diagnosis not present

## 2023-03-30 DIAGNOSIS — I129 Hypertensive chronic kidney disease with stage 1 through stage 4 chronic kidney disease, or unspecified chronic kidney disease: Secondary | ICD-10-CM | POA: Diagnosis not present

## 2023-05-04 ENCOUNTER — Ambulatory Visit: Payer: Medicare HMO | Admitting: Podiatry

## 2023-05-04 DIAGNOSIS — N2581 Secondary hyperparathyroidism of renal origin: Secondary | ICD-10-CM | POA: Diagnosis not present

## 2023-05-04 DIAGNOSIS — N1832 Chronic kidney disease, stage 3b: Secondary | ICD-10-CM | POA: Diagnosis not present

## 2023-05-04 DIAGNOSIS — E1122 Type 2 diabetes mellitus with diabetic chronic kidney disease: Secondary | ICD-10-CM | POA: Diagnosis not present

## 2023-05-04 DIAGNOSIS — E785 Hyperlipidemia, unspecified: Secondary | ICD-10-CM | POA: Diagnosis not present

## 2023-05-04 DIAGNOSIS — D631 Anemia in chronic kidney disease: Secondary | ICD-10-CM | POA: Diagnosis not present

## 2023-05-04 DIAGNOSIS — I129 Hypertensive chronic kidney disease with stage 1 through stage 4 chronic kidney disease, or unspecified chronic kidney disease: Secondary | ICD-10-CM | POA: Diagnosis not present

## 2023-05-11 ENCOUNTER — Ambulatory Visit: Payer: Medicare HMO | Admitting: Podiatry

## 2023-05-11 ENCOUNTER — Encounter: Payer: Self-pay | Admitting: Podiatry

## 2023-05-11 DIAGNOSIS — B351 Tinea unguium: Secondary | ICD-10-CM

## 2023-05-11 DIAGNOSIS — M79675 Pain in left toe(s): Secondary | ICD-10-CM

## 2023-05-11 DIAGNOSIS — Z7901 Long term (current) use of anticoagulants: Secondary | ICD-10-CM | POA: Diagnosis not present

## 2023-05-11 DIAGNOSIS — M79674 Pain in right toe(s): Secondary | ICD-10-CM | POA: Diagnosis not present

## 2023-05-11 DIAGNOSIS — L84 Corns and callosities: Secondary | ICD-10-CM

## 2023-05-11 NOTE — Progress Notes (Signed)
 Subjective: Chief Complaint  Patient presents with   RFC    RM#13 RFC     Sabrina Taylor is a 78 y.o. is seen today in office today for concerns of a painful callus on the bottom of her right foot as well as have her nails trimmed as are thickened elongated she cannot do them herself.  She has no other concerns today.  No open lesions.  She is on Plavix.  Objective: General: No acute distress, AAOx3  DP/PT pulses palpable 2/4, CRT < 3 sec to all digits.  Nails are hypertrophic, dystrophic, brittle, discolored, elongated 10. No surrounding redness or drainage. Tenderness nails 1-5 bilaterally.  No open lesions. Hyperkeratotic lesion right foot submetatarsal 3 on area of bony prominence.  There is some dried blood present underneath the area is preulcerative there is no skin breakdown today.  This appears to be stable.  There is no underlying ulceration, drainage or any signs of infection. No pain with calf compression, swelling, warmth, erythema.   Assessment and Plan:  Preulcerative callus; symptomatic onychomycosis  -Sharp debrided hyperkeratotic lesion times well any complications or bleeding.  Dispensed offloading pads -Sharp debrided nails x 10 without any complications or bleeding. -Sharply debrided the hyperkeratotic lesion x 1 without any complications or bleeding.  We discussed the conservative as well as surgical options.  Continue with conservative management for now. -Daily foot inspection.  Return in about 3 months (around 08/11/2023).   Vivi Barrack DPM

## 2023-07-01 DIAGNOSIS — D631 Anemia in chronic kidney disease: Secondary | ICD-10-CM | POA: Diagnosis not present

## 2023-07-01 DIAGNOSIS — I129 Hypertensive chronic kidney disease with stage 1 through stage 4 chronic kidney disease, or unspecified chronic kidney disease: Secondary | ICD-10-CM | POA: Diagnosis not present

## 2023-07-01 DIAGNOSIS — N2581 Secondary hyperparathyroidism of renal origin: Secondary | ICD-10-CM | POA: Diagnosis not present

## 2023-07-01 DIAGNOSIS — E1122 Type 2 diabetes mellitus with diabetic chronic kidney disease: Secondary | ICD-10-CM | POA: Diagnosis not present

## 2023-07-01 DIAGNOSIS — E785 Hyperlipidemia, unspecified: Secondary | ICD-10-CM | POA: Diagnosis not present

## 2023-07-01 DIAGNOSIS — N1832 Chronic kidney disease, stage 3b: Secondary | ICD-10-CM | POA: Diagnosis not present

## 2023-07-06 DIAGNOSIS — N1832 Chronic kidney disease, stage 3b: Secondary | ICD-10-CM | POA: Diagnosis not present

## 2023-07-06 DIAGNOSIS — N2581 Secondary hyperparathyroidism of renal origin: Secondary | ICD-10-CM | POA: Diagnosis not present

## 2023-07-06 DIAGNOSIS — E1122 Type 2 diabetes mellitus with diabetic chronic kidney disease: Secondary | ICD-10-CM | POA: Diagnosis not present

## 2023-07-06 DIAGNOSIS — D631 Anemia in chronic kidney disease: Secondary | ICD-10-CM | POA: Diagnosis not present

## 2023-07-06 DIAGNOSIS — E785 Hyperlipidemia, unspecified: Secondary | ICD-10-CM | POA: Diagnosis not present

## 2023-08-11 ENCOUNTER — Ambulatory Visit: Admitting: Podiatry

## 2023-08-11 DIAGNOSIS — M79674 Pain in right toe(s): Secondary | ICD-10-CM

## 2023-08-11 DIAGNOSIS — M79675 Pain in left toe(s): Secondary | ICD-10-CM | POA: Diagnosis not present

## 2023-08-11 DIAGNOSIS — L84 Corns and callosities: Secondary | ICD-10-CM

## 2023-08-11 DIAGNOSIS — Z7901 Long term (current) use of anticoagulants: Secondary | ICD-10-CM

## 2023-08-11 DIAGNOSIS — B351 Tinea unguium: Secondary | ICD-10-CM | POA: Diagnosis not present

## 2023-08-11 NOTE — Progress Notes (Signed)
 Subjective: Chief Complaint  Patient presents with   RFC    RM#11 RFC/ Right foot callus causing pain.    Sabrina Taylor is a 78 y.o. is seen today in office today for concerns of a painful callus on the bottom of her right foot as well as have her nails trimmed as are thickened elongated she cannot do them herself.  She does not report any open lesions or any drainage.  She did previously try inserts but did not help.  She tried offloading pads to the right foot but it causes her to be off balance.   She is on Plavix .  [name], [facility] Last seen: 3 months ago   Objective: General: No acute distress, AAOx3  DP/PT pulses palpable 2/4, CRT < 3 sec to all digits.  Nails are hypertrophic, dystrophic, brittle, discolored, elongated 10. No surrounding redness or drainage. Tenderness nails 1-5 bilaterally.  No open lesions. Hyperkeratotic lesion right foot submetatarsal 3 on area of bony prominence.  There is some dried blood present underneath the area is preulcerative there is no skin breakdown today.  Old blister type lesions noted at the left third, fifth toes with any surrounding erythema or signs of infection. No pain with calf compression, swelling, warmth, erythema.   Assessment and Plan:  Preulcerative callus; symptomatic onychomycosis  -Sharp debrided hyperkeratotic lesion times well any complications or bleeding.  Dispensed offloading pads -Sharp debrided nails x 10 without any complications or bleeding. -Sharply debrided the hyperkeratotic lesion x 1 without any complications or bleeding.  We discussed the conservative as well as surgical options.  Continue with conservative management for now. -Old blister type lesions of the left third toe, fifth toes.  Monitoring signs or symptoms of infection.  Both are starting to scab over likely, from the swelling. -Daily foot inspection.  Return in about 2 months (around 10/11/2023) for nail trim, pre-ulcerative callus;  also with Kerney Pee for insert modifications .   Charity Conch DPM

## 2023-08-11 NOTE — Patient Instructions (Signed)
 Monitor for any signs/symptoms of infection. Call the office immediately if any occur or go directly to the emergency room. Call with any questions/concerns.

## 2023-08-27 DIAGNOSIS — N184 Chronic kidney disease, stage 4 (severe): Secondary | ICD-10-CM | POA: Diagnosis not present

## 2023-08-27 DIAGNOSIS — E1122 Type 2 diabetes mellitus with diabetic chronic kidney disease: Secondary | ICD-10-CM | POA: Diagnosis not present

## 2023-08-27 DIAGNOSIS — R8290 Unspecified abnormal findings in urine: Secondary | ICD-10-CM | POA: Diagnosis not present

## 2023-08-27 DIAGNOSIS — D631 Anemia in chronic kidney disease: Secondary | ICD-10-CM | POA: Diagnosis not present

## 2023-08-27 DIAGNOSIS — E785 Hyperlipidemia, unspecified: Secondary | ICD-10-CM | POA: Diagnosis not present

## 2023-08-27 DIAGNOSIS — N1832 Chronic kidney disease, stage 3b: Secondary | ICD-10-CM | POA: Diagnosis not present

## 2023-08-27 DIAGNOSIS — I129 Hypertensive chronic kidney disease with stage 1 through stage 4 chronic kidney disease, or unspecified chronic kidney disease: Secondary | ICD-10-CM | POA: Diagnosis not present

## 2023-08-27 DIAGNOSIS — N2581 Secondary hyperparathyroidism of renal origin: Secondary | ICD-10-CM | POA: Diagnosis not present

## 2023-08-31 DIAGNOSIS — N2581 Secondary hyperparathyroidism of renal origin: Secondary | ICD-10-CM | POA: Diagnosis not present

## 2023-08-31 DIAGNOSIS — N1832 Chronic kidney disease, stage 3b: Secondary | ICD-10-CM | POA: Diagnosis not present

## 2023-08-31 DIAGNOSIS — E1122 Type 2 diabetes mellitus with diabetic chronic kidney disease: Secondary | ICD-10-CM | POA: Diagnosis not present

## 2023-08-31 DIAGNOSIS — D631 Anemia in chronic kidney disease: Secondary | ICD-10-CM | POA: Diagnosis not present

## 2023-08-31 DIAGNOSIS — I129 Hypertensive chronic kidney disease with stage 1 through stage 4 chronic kidney disease, or unspecified chronic kidney disease: Secondary | ICD-10-CM | POA: Diagnosis not present

## 2023-09-10 ENCOUNTER — Ambulatory Visit

## 2023-09-10 NOTE — Progress Notes (Signed)
 Patient was here for adjustments on CFO's she received in [id number]  Patient c/o thickness of orthotics as her hammertoes rub on top of shoe inside when wearing them  I thinned out orthotics all together and made more flexible as well  Patient seemed happy with adjustments was advised to call with any issues and will set appt for another adjustment  Lolita Schultze Cped, CFo, CFm

## 2023-10-01 DIAGNOSIS — E663 Overweight: Secondary | ICD-10-CM | POA: Diagnosis not present

## 2023-10-01 DIAGNOSIS — R030 Elevated blood-pressure reading, without diagnosis of hypertension: Secondary | ICD-10-CM | POA: Diagnosis not present

## 2023-10-01 DIAGNOSIS — H00029 Hordeolum internum unspecified eye, unspecified eyelid: Secondary | ICD-10-CM | POA: Diagnosis not present

## 2023-10-01 DIAGNOSIS — Z6829 Body mass index (BMI) 29.0-29.9, adult: Secondary | ICD-10-CM | POA: Diagnosis not present

## 2023-10-01 DIAGNOSIS — H109 Unspecified conjunctivitis: Secondary | ICD-10-CM | POA: Diagnosis not present

## 2023-10-02 DIAGNOSIS — H0011 Chalazion right upper eyelid: Secondary | ICD-10-CM | POA: Diagnosis not present

## 2023-10-12 ENCOUNTER — Ambulatory Visit: Admitting: Podiatry

## 2023-10-19 ENCOUNTER — Ambulatory Visit: Admitting: Podiatry

## 2023-10-19 DIAGNOSIS — Z7901 Long term (current) use of anticoagulants: Secondary | ICD-10-CM | POA: Diagnosis not present

## 2023-10-19 DIAGNOSIS — M79674 Pain in right toe(s): Secondary | ICD-10-CM

## 2023-10-19 DIAGNOSIS — B351 Tinea unguium: Secondary | ICD-10-CM | POA: Diagnosis not present

## 2023-10-19 DIAGNOSIS — L84 Corns and callosities: Secondary | ICD-10-CM | POA: Diagnosis not present

## 2023-10-19 DIAGNOSIS — M79675 Pain in left toe(s): Secondary | ICD-10-CM

## 2023-10-21 NOTE — Progress Notes (Signed)
 Subjective: Chief Complaint  Patient presents with   Nail Problem    Pt stated that she is here to have her nails trimmed and to take care of her callus     Sabrina Taylor is a 78 y.o. is seen today in office today for concerns of a painful callus on the bottom of her right foot as well as have her nails trimmed as are thickened elongated she cannot do them herself.  She does not report any open lesions or any drainage.  She is on Plavix .  [name], [employer] Last seen: 3 months ago   Objective: General: No acute distress, AAOx3  DP/PT pulses palpable 2/4, CRT < 3 sec to all digits.  Nails are hypertrophic, dystrophic, brittle, discolored, elongated 10. No surrounding redness or drainage. Tenderness nails 1-5 bilaterally.  No open lesions. Hyperkeratotic lesion right foot submetatarsal 3 on area of bony prominence.  There is some dried blood present underneath the area is preulcerative there is no skin breakdown today.  No pain with calf compression, swelling, warmth, erythema.   Assessment and Plan:  Preulcerative callus; symptomatic onychomycosis  -Sharp debrided hyperkeratotic lesion times well any complications or bleeding.  Dispensed offloading pads -Sharp debrided nails x 10 without any complications or bleeding. -Sharply debrided the hyperkeratotic lesion x 1 without any complications or bleeding.   -Daily foot inspection.  Return in about 2 months (around 12/19/2023).  Sabrina Taylor DPM

## 2023-12-21 ENCOUNTER — Ambulatory Visit: Admitting: Podiatry

## 2024-01-01 ENCOUNTER — Ambulatory Visit: Admitting: Podiatry

## 2024-01-01 ENCOUNTER — Encounter: Payer: Self-pay | Admitting: Podiatry

## 2024-01-01 DIAGNOSIS — B351 Tinea unguium: Secondary | ICD-10-CM

## 2024-01-01 DIAGNOSIS — L84 Corns and callosities: Secondary | ICD-10-CM

## 2024-01-01 DIAGNOSIS — Z7901 Long term (current) use of anticoagulants: Secondary | ICD-10-CM

## 2024-01-01 DIAGNOSIS — M79675 Pain in left toe(s): Secondary | ICD-10-CM | POA: Diagnosis not present

## 2024-01-01 DIAGNOSIS — M79674 Pain in right toe(s): Secondary | ICD-10-CM

## 2024-01-01 NOTE — Progress Notes (Signed)
 Subjective: Chief Complaint  Patient presents with   RFC    RFC Non diabetic toenail trim and callus care right plantar sulcus.    Sabrina Taylor is a 78 y.o. is seen today in office today for concerns of a painful callus on the bottom of her right foot as well as have her nails trimmed as are thickened elongated she cannot do them herself.  She does not report any open lesions or any drainage.  She said the callus will cause discomfort when she puts pressure on it without shoes but otherwise she has been doing well.  No swelling, redness or any drainage or signs of infection.  She is on Plavix .  [name], [facility]   Objective: General: No acute distress, AAOx3  DP/PT pulses palpable 2/4, CRT < 3 sec to all digits.  Nails are hypertrophic, dystrophic, brittle, discolored, elongated 10. No surrounding redness or drainage. Tenderness nails 1-5 bilaterally.  No open lesions. Hyperkeratotic lesion right foot submetatarsal 3 on area of bony prominence.  Smaller lesion noted on the medial aspect of the second toe on the right foot where it rubs the big toe.  There is some dried blood present underneath the area is preulcerative there is no skin breakdown today.  No pain with calf compression, swelling, warmth, erythema.   Assessment and Plan:  Preulcerative callus; symptomatic onychomycosis  -Sharp debrided hyperkeratotic lesion times well any complications or bleeding.  Dispensed offloading pads -Sharp debrided nails x 10 without any complications or bleeding. -Sharply debrided the hyperkeratotic lesion x 1 without any complications or bleeding.   -Daily foot inspection.  Return in about 3 months (around 04/02/2024).  [name] DPM

## 2024-04-04 ENCOUNTER — Ambulatory Visit: Admitting: Podiatry
